# Patient Record
Sex: Male | Born: 1980 | Race: White | Hispanic: No | Smoking: Former smoker
Health system: Southern US, Community
[De-identification: ages and names within clinical notes are randomized; demographics above are authoritative.]

## PROBLEM LIST (undated history)

## (undated) DIAGNOSIS — K760 Fatty (change of) liver, not elsewhere classified: Secondary | ICD-10-CM

## (undated) DIAGNOSIS — K429 Umbilical hernia without obstruction or gangrene: Secondary | ICD-10-CM

## (undated) DIAGNOSIS — I10 Essential (primary) hypertension: Secondary | ICD-10-CM

## (undated) DIAGNOSIS — Z9989 Dependence on other enabling machines and devices: Secondary | ICD-10-CM

## (undated) DIAGNOSIS — G4733 Obstructive sleep apnea (adult) (pediatric): Secondary | ICD-10-CM

## (undated) DIAGNOSIS — J45909 Unspecified asthma, uncomplicated: Secondary | ICD-10-CM

## (undated) HISTORY — DX: Obstructive sleep apnea (adult) (pediatric): Z99.89

## (undated) HISTORY — PX: EXTERNAL EAR SURGERY: SHX627

## (undated) HISTORY — DX: Essential (primary) hypertension: I10

## (undated) HISTORY — DX: Fatty (change of) liver, not elsewhere classified: K76.0

## (undated) HISTORY — DX: Morbid (severe) obesity due to excess calories: E66.01

## (undated) HISTORY — DX: Umbilical hernia without obstruction or gangrene: K42.9

## (undated) HISTORY — DX: Obstructive sleep apnea (adult) (pediatric): G47.33

## (undated) HISTORY — PX: MOLE REMOVAL: SHX2046

---

## 2003-01-09 HISTORY — PX: TONSILLECTOMY: SHX5217

## 2007-06-06 ENCOUNTER — Ambulatory Visit: Payer: Self-pay | Admitting: Internal Medicine

## 2007-06-06 LAB — CONVERTED CEMR LAB
ALT: 31 units/L (ref 0–53)
BUN: 14 mg/dL (ref 6–23)
Bilirubin Urine: NEGATIVE
Bilirubin, Direct: 0.1 mg/dL (ref 0.0–0.3)
CO2: 30 meq/L (ref 19–32)
Calcium: 9 mg/dL (ref 8.4–10.5)
Eosinophils Relative: 2.3 % (ref 0.0–5.0)
Glucose, Bld: 102 mg/dL — ABNORMAL HIGH (ref 70–99)
Hemoglobin: 16.5 g/dL (ref 13.0–17.0)
Leukocytes, UA: NEGATIVE
Lymphocytes Relative: 26.2 % (ref 12.0–46.0)
Monocytes Relative: 6.4 % (ref 3.0–12.0)
Neutro Abs: 5.4 10*3/uL (ref 1.4–7.7)
Nitrite: NEGATIVE
RBC: 5.89 M/uL — ABNORMAL HIGH (ref 4.22–5.81)
RDW: 12.7 % (ref 11.5–14.6)
Sodium: 141 meq/L (ref 135–145)
Specific Gravity, Urine: 1.01 (ref 1.000–1.03)
Total CHOL/HDL Ratio: 5.8
Total Protein, Urine: NEGATIVE mg/dL
Total Protein: 7.4 g/dL (ref 6.0–8.3)
WBC: 8.4 10*3/uL (ref 4.5–10.5)
pH: 7 (ref 5.0–8.0)

## 2007-06-10 ENCOUNTER — Ambulatory Visit: Payer: Self-pay | Admitting: Internal Medicine

## 2007-06-10 DIAGNOSIS — J309 Allergic rhinitis, unspecified: Secondary | ICD-10-CM | POA: Insufficient documentation

## 2007-06-10 DIAGNOSIS — E669 Obesity, unspecified: Secondary | ICD-10-CM | POA: Insufficient documentation

## 2007-06-10 DIAGNOSIS — F329 Major depressive disorder, single episode, unspecified: Secondary | ICD-10-CM | POA: Insufficient documentation

## 2009-01-25 ENCOUNTER — Ambulatory Visit (HOSPITAL_COMMUNITY): Admission: RE | Admit: 2009-01-25 | Discharge: 2009-01-25 | Payer: Self-pay | Admitting: Internal Medicine

## 2009-01-25 ENCOUNTER — Ambulatory Visit: Payer: Self-pay | Admitting: Internal Medicine

## 2009-01-31 ENCOUNTER — Encounter (INDEPENDENT_AMBULATORY_CARE_PROVIDER_SITE_OTHER): Payer: Self-pay | Admitting: *Deleted

## 2009-02-22 ENCOUNTER — Ambulatory Visit: Payer: Self-pay | Admitting: Sports Medicine

## 2009-03-08 ENCOUNTER — Ambulatory Visit: Payer: Self-pay | Admitting: Sports Medicine

## 2009-04-11 ENCOUNTER — Ambulatory Visit: Payer: Self-pay | Admitting: Sports Medicine

## 2009-04-19 ENCOUNTER — Ambulatory Visit (HOSPITAL_COMMUNITY): Admission: RE | Admit: 2009-04-19 | Discharge: 2009-04-19 | Payer: Self-pay | Admitting: Sports Medicine

## 2010-02-07 NOTE — Assessment & Plan Note (Signed)
Summary: F/U FOOT ,MC   Vital Signs:  Patient profile:   30 year old male BP sitting:   145 / 90  Vitals Entered By: Lillia Pauls CMA (April 11, 2009 10:21 AM)  Primary Provider:  Norins   History of Present Illness: Pt here today to f/u left metatarsal fx, which he states is feel about 50% better now.  He now can walk "normally" on it now he sts. has worn cam walker for 4 wks on last visit some early callus over 5th MT and tender in that area of prox shaft  job is more sitting  Allergies: No Known Drug Allergies  Physical Exam  General:  obese  NAD Msk:  no swelling over left 5th MT now non tender to palpation of percussion thickened area feels like callus at prox shaft  MSK [place] there is clearly thickened bone cortex no neovessels or inc flow now callus is now "hard" remodeling seems to be taking place  images saved   Impression & Recommendations:  Problem # 1:  CLOSED FRACTURE OF METATARSAL BONE (ICD-825.25)  Orders: Radiology other (Radiology Other) [place] LIMITED ([id number])  looks to be helaing well by [place]  will ck plain film to confirm deg of callus  stay in cam walker and reck 2 wks  Problem # 2:  FOOT PAIN, LEFT (ICD-729.5) much less  still stay in cam walker but bring shoes on next visit as we may try to wean  Complete Medication List: 1)  Zyrtec Allergy 10 Mg Tabs (Cetirizine hcl) .... Take 1 tablet by mouth once a day as needed 2)  Aspirin Adult Low Strength 81 Mg Tbec (Aspirin) .... Take 1 tablet by mouth once a day as needed for pain

## 2010-02-07 NOTE — Letter (Signed)
Summary: *Consult Note  [facility]  [address]   [place], [place] [address]   Phone: [phone]  Fax: [phone]    Re:    Aaron Frost DOB:    10-Dec-1980 Dr. Doroteo [facility] 02/22/2009  Fax: [phone]  Dear [name]:    Thank you for requesting that we see the above patient for consultation.  A copy of the detailed office note will be sent under separate cover, for your review.  Evaluation today is consistent with: cuboid subluxation.  This is a bit of an unusual ligamentous injury to the lateral foot but certainly would be consistent with his normal Xrays and with what we found today on exam.   Our recommendation is for: Laterally cushioned insoles as well as a restrictive arch strap to try to allow the ligaments to heal.  I would like to recheck this to monitor progress in 4 weeks.  If not resolved at that point would look into other diagnostic options.   New Orders include:  1)  Consultation Level II [[address]242]   After today's visit, the patients current medications include: 1)  ZYRTEC ALLERGY 10 MG  TABS (CETIRIZINE HCL) Take 1 tablet by mouth once a day as needed 2)  ASPIRIN ADULT LOW STRENGTH 81 MG  TBEC (ASPIRIN) Take 1 tablet by mouth once a day as needed for pain   Thank you for this consultation.  If you have any further questions regarding the care of this patient, please do not hesitate to contact me @ [phone].  Thank you for this opportunity to look after your patient.   Sincerely,  Vincent Gros MD

## 2010-02-07 NOTE — Assessment & Plan Note (Signed)
Summary: LEFT FOOT GROWTH,PAIN, OFFERED SDA   Vital Signs:  Patient profile:   30 year old male Height:      73.5 inches Weight:      383 pounds BMI:     50.03 O2 Sat:      97 % on Room air Temp:     98.1 degrees F oral Pulse rate:   80 / minute BP sitting:   122 / 90  (left arm) Cuff size:   large  Vitals Entered By: Bill Salinas CMA (January 25, 2009 2:48 PM)  O2 Flow:  Room air CC: pt presents today with knot on his left foot that he states has been there x 2 week/ ab   Primary Care Provider:  Norins  CC:  pt presents today with knot on his left foot that he states has been there x 2 week/ ab.  History of Present Illness: Patient last seen June '09. Primary problem is obesity. His goal was to move towards 220 from 335. He has gone up to 385 instead. He does report that he is participating in a "Weight Watchers" program. We did discuss that this is the major health risk factor for him.  He presents today with c/o a painful nodule at the lateral aspect of the left foot. This is worse with weight bearing. He has discomfort with most shoes and this definitly is limiting his activities.  He does not recall any injury or precipitating event.   Current Medications (verified): 1)  Zyrtec Allergy 10 Mg  Tabs (Cetirizine Hcl) .... Take 1 Tablet By Mouth Once A Day As Needed 2)  Aspirin Adult Low Strength 81 Mg  Tbec (Aspirin) .... Take 1 Tablet By Mouth Once A Day As Needed For Pain  Allergies (verified): No Known Drug Allergies  Past History:  Past Medical History: Last updated: 06/10/2007 Chicken Pox,  fully immunized Depression-no professional diagnosis or treatment Allergic rhinitis  Past Surgical History: Last updated: 06/10/2007 Tonsillectomy '05 Right ear reconstruction  Family History: Last updated: 06/10/2007 father-'51: CAD/MI, Lipids mother - '57: overweight Neg- prostate cancer Maternal kinship with CAD, DM PGF - colon cancer  Social History: Last  updated: 06/10/2007 [employer] - Major in anthropology Engaged with wedding 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  The patient denies anorexia, fever, vision loss, hoarseness, chest pain, dyspnea on exertion, abdominal pain, incontinence, muscle weakness, depression, abnormal bleeding, and enlarged lymph nodes.    Physical Exam  General:  Obese white male in no distress Head:  Normocephalic and atraumatic without obvious abnormalities. No apparent alopecia or balding. Lungs:  normal respiratory effort and normal breath sounds.   Heart:  normal rate and regular rhythm.   Msk:  left foot with tenderness over the 5th metatarsal at mid-foot. No palpable mass or cyst. There is no deformity of the toes. No skin chagnes.  Neurologic:  alert & oriented X3.   Skin:  turgor normal, color normal, and no edema.   Cervical Nodes:  no anterior cervical adenopathy and no posterior cervical adenopathy.   Psych:  Oriented X3, memory intact for recent and remote, good eye contact, and not anxious appearing.     Impression & Recommendations:  Problem # 1:  FOOT PAIN, LEFT (ICD-729.5)  Orders: T-Foot Left  Min 3 Views [phone]) Sports Medicine (Sports Med)  addendum- Clinical Data: History given of pain on the lateral side of the left foot for 1 week.  History of soft tissue swelling.  No known injury.   LEFT FOOT - COMPLETE 3+ VIEW   Comparison: None   Findings: Posterior and plantar calcaneal spurring is present. Alignment is normal.  Joint spaces are preserved.  No fracture or dislocation is evident.  No soft tissue lesions are seen.   IMPRESSION: No fracture is evident.  There is calcaneal spurring.   Read By:  Crawford Givens,  M.D.  Plan - will refer  to Dr. Darrick Penna for consult re: foot pain.  Problem # 2:  OBESITY, UNSPECIFIED (ICD-278.00) Discussed the need for weight loss and the health implications.  Plan - he will continue with weight watchers.   Problem # 3:  Preventive Health Care (ICD-V70.0) Reveiwed note and labs from June '09. All labs including lipid panel were normal.  He is advised that he should have repeat labs in 2012 or 2013.  Complete Medication List: 1)  Zyrtec Allergy 10 Mg Tabs (Cetirizine hcl) .... Take 1 tablet by mouth once a day as needed 2)  Aspirin Adult Low Strength 81 Mg Tbec (Aspirin) .... Take 1 tablet by mouth once a day as needed for pain

## 2010-02-07 NOTE — Assessment & Plan Note (Signed)
Summary: SWOLLEN FOOT,MC   Vital Signs:  Patient profile:   30 year old male BP sitting:   133 / 86  Vitals Entered By: Lillia Pauls CMA (March 08, 2009 2:46 PM)  Primary Care Provider:  Norins  CC:  f/u left foot pain.  History of Present Illness: F/U left foot pain, was here 2 weeks ago with subluxation and ligamentous strain at the cuboid and base of 4th and 5th met.  Was given comforthotics with lateral post and transverse arch band.  Says it felt okay for a week or so, but last sunday he did some walking, he was on his feet for about 3 hours.  Since then foot has been much more swollen and painful.  He has a burning sensation on the lateral portion of the foot from midfoot down.  Hasn't been able to use the arch band because of the swelling and pain.    Problems Prior to Update: 1)  Closed Fracture of Metatarsal Bone  (ICD-825.25) 2)  Foot Pain, Left  (ICD-729.5) 3)  Obesity, Unspecified  (ICD-278.00) 4)  Allergic Rhinitis  (ICD-477.9) 5)  Depression  (ICD-311)  Medications Prior to Update: 1)  Zyrtec Allergy 10 Mg  Tabs (Cetirizine Hcl) .... Take 1 Tablet By Mouth Once A Day As Needed 2)  Aspirin Adult Low Strength 81 Mg  Tbec (Aspirin) .... Take 1 Tablet By Mouth Once A Day As Needed For Pain  Allergies: No Known Drug Allergies  Review of Systems MS:  Complains of joint pain and joint swelling. Neuro:  Complains of tingling; denies numbness and weakness.  Physical Exam  General:  overweight, NAD Msk:  left foot has visible swelling compared to right especially in the forefoot TTP along the base of the 5th met and cuboid both plantar side and dorsal side and lateral some breakdown of lateral 2 rays, some internal rotation of 4th and 5th toes   Ultrasound demonstrates a fracture of the shaft of 5th metatarsal likely oblique type orientation with some callus formation apparent.  Significant increased blood flow and edema.  Seen in both transverse and cross sectional  views.  Saved for documentation. Pulses:  normal distal pulses Neurologic:  sensation intact Psych:  Cognition and judgment appear intact. Alert and cooperative with normal attention span and concentration.    Impression & Recommendations:  Problem # 1:  CLOSED FRACTURE OF METATARSAL BONE (ICD-825.25) Assessment New  5th met fracture seen on ultrasound imaging in office will place in Cam walker and f/u in 2 weeks with repeat imaging, likely need 6-8 weeks in cam walker  Orders: [place] LIMITED ([id number])  Complete Medication List: 1)  Zyrtec Allergy 10 Mg Tabs (Cetirizine hcl) .... Take 1 tablet by mouth once a day as needed 2)  Aspirin Adult Low Strength 81 Mg Tbec (Aspirin) .... Take 1 tablet by mouth once a day as needed for pain  Patient Instructions: 1)  Will need to order CamWalker, return to office Friday for pick up 2)  Will wrap with ace wrap today and some cast padding  Appended Document: SWOLLEN FOOT,MC

## 2010-02-07 NOTE — Letter (Signed)
Summary: [facility] Consult Scheduled Letter  [facility]  [address] [place], [place] [address]   Phone: [phone]  Fax: [phone]      01/31/2009 MRN: [id number]  Bing Bassette [address] [place], [place]  [address]    Dear Mr. Hitz,      We have scheduled an appointment for you.  At the recommendation of Dr.Norins, we have scheduled you a consult with Dr Darrick Penna on 02/14/09 at 9:15am.  Their phone number is [phone].  If this appointment day and time is not convenient for you, please feel free to call the office of the doctor you are being referred to at the number listed above and reschedule the appointment.    [facility] [address] [place], [place] [address] *Located beside the [facility]*    Thank you,  Patient Care Coordinator [facility]

## 2010-02-07 NOTE — Assessment & Plan Note (Signed)
Summary: L FOOT PAIN,MC   Vital Signs:  Patient profile:   30 year old male BP sitting:   134 / 88  Vitals Entered By: Lillia Pauls CMA (February 22, 2009 10:08 AM)  Primary Provider:  Norins  CC:  left foot pain.  History of Present Illness: Pt c/o left lateral foot pain for the last 6 weeks. He doesn't remember a specific injury, says he just got out of bed one day and the left side of his left foot had a sharp burning pain which occurs with each step.  Has been constant since then. Pain is located over the area of the 5th met and the cuboid. It's also affecting his bowling since the left foot is his plant foot.  Recently saw his internist, Dr Debby Bud,  who orderd xrays which were negative.  Pt has been using ibuprofen for pain.    Dr Debby Bud referred him to [place] for our impression.  Problems Prior to Update: 1)  Foot Pain, Left  (ICD-729.5) 2)  Obesity, Unspecified  (ICD-278.00) 3)  Allergic Rhinitis  (ICD-477.9) 4)  Depression  (ICD-311)  Allergies: No Known Drug Allergies  Social History: Reviewed history from 06/10/2007 and no changes required. 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 Neuro:  Denies numbness, tingling, and weakness.  Physical Exam  General:  obese male in NAD Msk:  feet bilat reveal some breakdown of lat 2 rays with slight IR of 4tha nd 5th toes some loss of long arch as well left foot shows mild swelling over base of 5th MT  on prone lying position a whip test reveals hypermobility of left lat foot this also reproduces some of his pain  note on direct palpation there is minimal pain  manipulation of cuboid also reroduces some pain as does resistance of eversion at insertion of per brevis tendon  Gait is supinated bilat   Impression & Recommendations:  Problem # 1:  FOOT PAIN, LEFT (ICD-729.5) This is likely to be a cuboid subluxation  and ligamentous strain at base of 5th, 4th MT and cuboid mid arch strap for at least 6 weeks cushioned insoles with lateral posting bilat icing at end of day  reck 1 month  note he is more comfortable walking once he is placed in these  Problem # 2:  OBESITY, UNSPECIFIED (ICD-278.00) while his weight undoubtedly contributes this may have flared 2/2 his bowling shoes have little support forward foot takes rotatinal stress  Complete Medication List: 1)  Zyrtec Allergy 10 Mg Tabs (Cetirizine hcl) .... Take 1 tablet by mouth once a day as needed 2)  Aspirin Adult Low Strength 81 Mg Tbec (Aspirin) .... Take 1 tablet by mouth once a day as needed for pain

## 2010-06-29 ENCOUNTER — Encounter: Payer: Self-pay | Admitting: Internal Medicine

## 2010-07-06 ENCOUNTER — Encounter: Payer: Self-pay | Admitting: Internal Medicine

## 2010-07-14 ENCOUNTER — Ambulatory Visit (INDEPENDENT_AMBULATORY_CARE_PROVIDER_SITE_OTHER): Payer: Managed Care, Other (non HMO) | Admitting: Internal Medicine

## 2010-07-14 DIAGNOSIS — E669 Obesity, unspecified: Secondary | ICD-10-CM

## 2010-07-14 DIAGNOSIS — I10 Essential (primary) hypertension: Secondary | ICD-10-CM

## 2010-07-14 MED ORDER — HYDROCHLOROTHIAZIDE 25 MG PO TABS: 25.0000 mg | ORAL_TABLET | Freq: Every day | ORAL | Status: DC

## 2010-07-14 NOTE — Progress Notes (Signed)
  Subjective:    Patient ID: Aaron Frost, male    DOB: 26-May-1980, 30 y.o.   MRN: [id number]  HPI Aaron Frost presents c/o cold feet, sensation of fluid retention and swelling in his feet. He also generally feels weak and low on energy. He had no focal complaints otherwise.     Past Medical History:    Reviewed history and no changes required:       Chicken Pox,  fully immunized       Depression-no professional diagnosis or treatment       Allergic rhinitis  Past Surgical History:    Reviewed history and no changes required:       Tonsillectomy '05       Right ear reconstruction   Family History:    father-'51: CAD/MI, Lipids    mother - '57: overweight    Neg- prostate cancer    Maternal kinship with CAD, DM    PGF - colon cancer  Social History:    [employer] - Major in anthropology    Engaged with wedding Sp '10    work: Clinical biochemist          Review of Systems Review of Systems  Constitutional:  Negative for fever, chills, activity change and unexpected weight change.  HEENT:  Negative for hearing loss, ear pain, congestion, neck stiffness and postnasal drip. Negative for sore throat or swallowing problems. Negative for dental complaints.   Eyes: Negative for vision loss or change in visual acuity.  Respiratory: Negative for chest tightness and wheezing.   Cardiovascular: Negative for chest pain and palpitation. No decreased exercise tolerance Gastrointestinal: No change in bowel habit. No bloating or gas. No reflux or indigestion Genitourinary: Negative for urgency, frequency, flank pain and difficulty urinating.  Musculoskeletal: Negative for myalgias, back pain, arthralgias and gait problem.  Neurological: Negative for dizziness, tremors, weakness and headaches.  Hematological: Negative for adenopathy.  Psychiatric/Behavioral: Negative for behavioral problems and dysphoric mood.       Objective:   Physical Exam Vitals noted - massively  overweight Gen'l - obese whte male in no acute distress HEENT C&S clear Pulmonary - normal respirations w/o increased WOB Cor - 2+ radial, DP and PT pulses; normal capillary refill at the toes; not cool to touch at the feet       Assessment & Plan:  1. Cold  Feet - normal circulation. This may be a neuropathy leading to the sensation of coldness. There is no pitting edema but he is at risk for peripehral venous insufficiency.  Plan - no cardiovascular work-up at this time.

## 2010-07-16 NOTE — Assessment & Plan Note (Signed)
BMI is 47.4 based on an estimated height of 74 inches. Discussed obesity with the patient and his wife. This is a MAJOR health risk! Today he is hypertensive and this is directly related to his weight. He states that he has tried nutri-systems and found it intolerable. He has tried weight watchers but did not like this program.   Plan - he is strongly encouraged to consider bariatric surgery - lap band procedure. He has a preformed bias in that his grandmother had gastric by-pass and had a lot of problems afterward. He is advised that a lot of progress has been made and that there are fewer complications. Furthermore, I shared with him that there is incontrovertible evidence that bariatric surgery and reduce all the metabolic diseases associated with obesity: hypertension, diabetes, heart disease, joint disease. He is instructed to contact [facility] to sign up for the next bariatric surgery education program.

## 2010-07-16 NOTE — Assessment & Plan Note (Signed)
Elevated Blood pressure but asymptomatic.  Plan - start HCTZ 25 mg once a day           Return for BP follow-up and lab work in 3 weeks.

## 2010-07-17 ENCOUNTER — Telehealth: Payer: Self-pay | Admitting: *Deleted

## 2010-07-17 NOTE — Telephone Encounter (Signed)
Message copied by [facility], Ty Buntrock B on Mon Jul 17, 2010  3:37 PM ------      Message from: Jacques Navy      Created: Sun Jul 16, 2010  4:16 PM       1. Copy note to patient      2. Call to let him know labs are ordered for July 30th - ok for fluids and very light bkfast that morning.       3. He should have BP check July 30th            Thank you

## 2010-07-18 NOTE — Telephone Encounter (Signed)
Patient informed, Nurse visit scheduled.

## 2010-08-04 DIAGNOSIS — Z0289 Encounter for other administrative examinations: Secondary | ICD-10-CM

## 2011-03-16 ENCOUNTER — Encounter: Payer: Self-pay | Admitting: Internal Medicine

## 2011-03-16 ENCOUNTER — Ambulatory Visit (INDEPENDENT_AMBULATORY_CARE_PROVIDER_SITE_OTHER): Payer: Managed Care, Other (non HMO) | Admitting: Internal Medicine

## 2011-03-16 DIAGNOSIS — Z0000 Encounter for general adult medical examination without abnormal findings: Secondary | ICD-10-CM

## 2011-03-16 DIAGNOSIS — R0989 Other specified symptoms and signs involving the circulatory and respiratory systems: Secondary | ICD-10-CM

## 2011-03-16 DIAGNOSIS — J392 Other diseases of pharynx: Secondary | ICD-10-CM

## 2011-03-16 DIAGNOSIS — R0609 Other forms of dyspnea: Secondary | ICD-10-CM

## 2011-03-16 DIAGNOSIS — Z23 Encounter for immunization: Secondary | ICD-10-CM

## 2011-03-16 DIAGNOSIS — I10 Essential (primary) hypertension: Secondary | ICD-10-CM

## 2011-03-16 DIAGNOSIS — R0683 Snoring: Secondary | ICD-10-CM

## 2011-03-16 DIAGNOSIS — F329 Major depressive disorder, single episode, unspecified: Secondary | ICD-10-CM

## 2011-03-16 LAB — BASIC METABOLIC PANEL
CO2: 25 mEq/L (ref 19–32)
Calcium: 9.1 mg/dL (ref 8.4–10.5)
Creat: 0.85 mg/dL (ref 0.50–1.35)
Glucose, Bld: 140 mg/dL — ABNORMAL HIGH (ref 70–99)

## 2011-03-16 LAB — HEPATIC FUNCTION PANEL
ALT: 50 U/L (ref 0–53)
Indirect Bilirubin: 0.3 mg/dL (ref 0.0–0.9)
Total Protein: 7.1 g/dL (ref 6.0–8.3)

## 2011-03-16 LAB — LIPID PANEL
Cholesterol: 199 mg/dL (ref 0–200)
Triglycerides: 197 mg/dL — ABNORMAL HIGH (ref <150)
VLDL: 39 mg/dL (ref 0–40)

## 2011-03-16 MED ORDER — OMEPRAZOLE 40 MG PO CPDR: 40.0000 mg | DELAYED_RELEASE_CAPSULE | Freq: Every day | ORAL | Status: DC

## 2011-03-16 NOTE — Progress Notes (Signed)
  Subjective:    Patient ID: Aaron Frost, male    DOB: 09/12/1980, 31 y.o.   MRN: [id number]  HPI Pt presents to clinic for general physical. bp mildly elevated without medication. Previously took hctz and stopped in the fall. Has known obesity and states has in the past lost over 100lbs with diet/exercise. Feels depressed mood due to his weight. Has GERD sx's 4-5x/week without dysphagia. Has h/o snoring and witnessed apnea. Notes several day h/o ST without f/c or cough.  Past Medical History  Diagnosis Date  . GERD (gastroesophageal reflux disease)   . Hypertension    Past Surgical History  Procedure Date  . Tonsillectomy 2005  . External ear surgery     right ear reconstruction    reports that he has quit smoking. He has never used smokeless tobacco. He reports that he drinks alcohol. He reports that he does not use illicit drugs. family history includes Colon cancer in his paternal grandfather; Diabetes in his maternal grandfather; Heart disease in his father; and Hypertension in his mother.  There is no history of Breast cancer and Prostate cancer. No Known Allergies   Review of Systems see hpi     Objective:   Physical Exam  Nursing note and vitals reviewed. Constitutional: He appears well-developed and well-nourished. No distress.  HENT:  Head: Normocephalic and atraumatic.  Right Ear: Tympanic membrane, external ear and ear canal normal.  Left Ear: Tympanic membrane, external ear and ear canal normal.  Nose: Nose normal.  Mouth/Throat: Oropharynx is clear and moist. No oropharyngeal exudate.  Eyes: Conjunctivae and EOM are normal. Pupils are equal, round, and reactive to light. No scleral icterus.  Neck: Neck supple. No thyromegaly present.  Cardiovascular: Normal rate, regular rhythm and normal heart sounds.  Exam reveals no gallop and no friction rub.   No murmur heard. Pulmonary/Chest: Effort normal and breath sounds normal. No respiratory distress. He has no  wheezes. He has no rales.  Abdominal: Soft. Bowel sounds are normal. He exhibits no distension and no mass. There is no tenderness. There is no rebound and no guarding.  Lymphadenopathy:    He has no cervical adenopathy.  Neurological: He is alert.  Skin: Skin is warm and dry. He is not diaphoretic.  Psychiatric: He has a normal mood and affect.          Assessment & Plan:

## 2011-03-17 LAB — CBC WITH DIFFERENTIAL/PLATELET
Basophils Relative: 1 % (ref 0–1)
Eosinophils Absolute: 0.3 10*3/uL (ref 0.0–0.7)
Eosinophils Relative: 5 % (ref 0–5)
HCT: 48.3 % (ref 39.0–52.0)
Hemoglobin: 16 g/dL (ref 13.0–17.0)
Lymphs Abs: 2.1 10*3/uL (ref 0.7–4.0)
MCHC: 33.1 g/dL (ref 30.0–36.0)
MCV: 85.6 fL (ref 78.0–100.0)
Monocytes Relative: 8 % (ref 3–12)
Platelets: 261 10*3/uL (ref 150–400)
RBC: 5.64 MIL/uL (ref 4.22–5.81)
WBC: 6.8 10*3/uL (ref 4.0–10.5)

## 2011-03-17 LAB — URINALYSIS, MICROSCOPIC ONLY: Squamous Epithelial / LPF: NONE SEEN

## 2011-03-17 LAB — URINALYSIS, ROUTINE W REFLEX MICROSCOPIC
Bilirubin Urine: NEGATIVE
Leukocytes, UA: NEGATIVE
Protein, ur: NEGATIVE mg/dL
Specific Gravity, Urine: 1.026 (ref 1.005–1.030)
Urobilinogen, UA: 0.2 mg/dL (ref 0.0–1.0)

## 2011-03-17 LAB — TSH: TSH: 2.738 u[IU]/mL (ref 0.350–4.500)

## 2011-03-17 LAB — HEMOGLOBIN A1C
Hgb A1c MFr Bld: 5.5 % (ref <5.7)
Mean Plasma Glucose: 111 mg/dL (ref <117)

## 2011-03-17 NOTE — Assessment & Plan Note (Signed)
Low sodium diet, exercise and wt loss. Maintain bp log and schedule close f/u.

## 2011-03-17 NOTE — Assessment & Plan Note (Signed)
Nl exam. Obtain cpe labs. 

## 2011-03-17 NOTE — Assessment & Plan Note (Signed)
Pulmonary consult. Wt loss encouraged.

## 2011-03-17 NOTE — Assessment & Plan Note (Signed)
Rapid strep neg

## 2011-03-17 NOTE — Assessment & Plan Note (Signed)
No si. Currently defers therapist or medication

## 2011-04-02 ENCOUNTER — Encounter: Payer: Self-pay | Admitting: Internal Medicine

## 2011-04-02 ENCOUNTER — Ambulatory Visit (INDEPENDENT_AMBULATORY_CARE_PROVIDER_SITE_OTHER): Payer: Managed Care, Other (non HMO) | Admitting: Internal Medicine

## 2011-04-02 VITALS — BP 142/100 | HR 99 | Temp 99.8°F | Resp 20

## 2011-04-02 DIAGNOSIS — J069 Acute upper respiratory infection, unspecified: Secondary | ICD-10-CM

## 2011-04-02 DIAGNOSIS — J029 Acute pharyngitis, unspecified: Secondary | ICD-10-CM

## 2011-04-02 DIAGNOSIS — R059 Cough, unspecified: Secondary | ICD-10-CM

## 2011-04-02 DIAGNOSIS — R05 Cough: Secondary | ICD-10-CM

## 2011-04-02 MED ORDER — ALBUTEROL SULFATE HFA 108 (90 BASE) MCG/ACT IN AERS: 2.0000 | INHALATION_SPRAY | Freq: Four times a day (QID) | RESPIRATORY_TRACT | Status: DC | PRN

## 2011-04-02 MED ORDER — LEVOFLOXACIN 500 MG PO TABS: 500.0000 mg | ORAL_TABLET | Freq: Every day | ORAL | Status: AC

## 2011-04-03 ENCOUNTER — Ambulatory Visit (HOSPITAL_BASED_OUTPATIENT_CLINIC_OR_DEPARTMENT_OTHER)
Admission: RE | Admit: 2011-04-03 | Discharge: 2011-04-03 | Disposition: A | Discharge status: Home / Self Care | Payer: Managed Care, Other (non HMO) | Source: Ambulatory Visit | Attending: Internal Medicine | Admitting: Internal Medicine

## 2011-04-03 DIAGNOSIS — R059 Cough, unspecified: Secondary | ICD-10-CM

## 2011-04-03 DIAGNOSIS — R05 Cough: Secondary | ICD-10-CM

## 2011-04-03 DIAGNOSIS — R062 Wheezing: Secondary | ICD-10-CM | POA: Insufficient documentation

## 2011-04-05 ENCOUNTER — Ambulatory Visit: Payer: Managed Care, Other (non HMO) | Admitting: Internal Medicine

## 2011-04-07 NOTE — Assessment & Plan Note (Addendum)
Attempt abx course. Albuterol mdi prn. Obtain cxr. Rapid strep neg Followup if no improvement or worsening.

## 2011-04-07 NOTE — Progress Notes (Signed)
  Subjective:    Patient ID: Aaron Frost, male    DOB: 08-12-1980, 31 y.o.   MRN: [id number]  HPI Pt presents to clinic for evaluation of cough. Notes 6d h/o throat irritation and cough intermittently productive for green/brown sputum. Has noted intermittent minimal hemoptysis without gross active bleeding. +intermittent subjective wheezing. Taking otc medication without improvement. No other alleviating or exacerbating factors.   Past Medical History  Diagnosis Date  . GERD (gastroesophageal reflux disease)   . Hypertension    Past Surgical History  Procedure Date  . Tonsillectomy 2005  . External ear surgery     right ear reconstruction    reports that he has quit smoking. He has never used smokeless tobacco. He reports that he drinks alcohol. He reports that he does not use illicit drugs. family history includes Colon cancer in his paternal grandfather; Diabetes in his maternal grandfather; Heart disease in his father; and Hypertension in his mother.  There is no history of Breast cancer and Prostate cancer. No Known Allergies   Review of Systems see hpi     Objective:   Physical Exam  Nursing note and vitals reviewed. Constitutional: He appears well-developed and well-nourished. No distress.  HENT:  Head: Normocephalic and atraumatic.  Mouth/Throat: Uvula is midline. Posterior oropharyngeal erythema present. No posterior oropharyngeal edema.  Eyes: Conjunctivae are normal. No scleral icterus.  Neck: Neck supple.  Cardiovascular: Normal rate, regular rhythm and normal heart sounds.   Pulmonary/Chest: Effort normal. No respiratory distress. He has wheezes. He has no rales.  Lymphadenopathy:    He has no cervical adenopathy.  Neurological: He is alert.  Skin: Skin is warm and dry. He is not diaphoretic.  Psychiatric: He has a normal mood and affect.          Assessment & Plan:

## 2011-04-23 ENCOUNTER — Institutional Professional Consult (permissible substitution): Payer: Managed Care, Other (non HMO) | Admitting: Pulmonary Disease

## 2011-04-25 ENCOUNTER — Ambulatory Visit: Payer: Managed Care, Other (non HMO) | Admitting: Internal Medicine

## 2011-05-18 ENCOUNTER — Ambulatory Visit: Payer: Managed Care, Other (non HMO) | Admitting: Internal Medicine

## 2011-05-29 ENCOUNTER — Ambulatory Visit (INDEPENDENT_AMBULATORY_CARE_PROVIDER_SITE_OTHER): Payer: Managed Care, Other (non HMO) | Admitting: Internal Medicine

## 2011-05-29 ENCOUNTER — Encounter: Payer: Self-pay | Admitting: Internal Medicine

## 2011-05-29 VITALS — BP 126/96 | HR 68 | Temp 98.2°F | Resp 18 | Wt >= 6400 oz

## 2011-05-29 DIAGNOSIS — R0683 Snoring: Secondary | ICD-10-CM

## 2011-05-29 DIAGNOSIS — I10 Essential (primary) hypertension: Secondary | ICD-10-CM

## 2011-05-29 DIAGNOSIS — E3452 Partial androgen insensitivity syndrome: Secondary | ICD-10-CM | POA: Insufficient documentation

## 2011-05-29 DIAGNOSIS — E669 Obesity, unspecified: Secondary | ICD-10-CM

## 2011-05-29 DIAGNOSIS — R0989 Other specified symptoms and signs involving the circulatory and respiratory systems: Secondary | ICD-10-CM

## 2011-05-29 DIAGNOSIS — R0609 Other forms of dyspnea: Secondary | ICD-10-CM

## 2011-05-29 MED ORDER — OMEPRAZOLE 40 MG PO CPDR: 40.0000 mg | DELAYED_RELEASE_CAPSULE | Freq: Every day | ORAL | Status: DC

## 2011-05-29 MED ORDER — LOSARTAN POTASSIUM 50 MG PO TABS: 50.0000 mg | ORAL_TABLET | Freq: Every day | ORAL | Status: DC

## 2011-05-29 NOTE — Assessment & Plan Note (Signed)
reattempt pulmonary consult

## 2011-05-29 NOTE — Assessment & Plan Note (Signed)
Discussed dietary changes including portion/calorie reduction as well as sugar/carb restriction. Begin regular exercise program with walking at least 3-4/wk for 30+minutes.

## 2011-05-29 NOTE — Assessment & Plan Note (Signed)
Lab does not perform outpt sperm analysis. Recommend that his wife contact her gyn.

## 2011-05-29 NOTE — Progress Notes (Signed)
  Subjective:    Patient ID: Aaron Frost, male    DOB: [birth year], 31 y.o.   MRN: [id number]  HPI Pt presents to clinic for followup of multiple medical problems. BP remains elevated. Feels mood is improved despite extra stressors with moving. Has snoring and did not get pulmonary appt after referral. His wife is being evaluated for infertility and he asks about performing sperm analysis.  Past Medical History  Diagnosis Date  . GERD (gastroesophageal reflux disease)   . Hypertension    Past Surgical History  Procedure Date  . Tonsillectomy 2005  . External ear surgery     right ear reconstruction    reports that he has quit smoking. He has never used smokeless tobacco. He reports that he drinks alcohol. He reports that he does not use illicit drugs. family history includes Colon cancer in his paternal grandfather; Diabetes in his maternal grandfather; Heart disease in his father; and Hypertension in his mother.  There is no history of Breast cancer and Prostate cancer. No Known Allergies    Review of Systems see hpi     Objective:   Physical Exam  Physical Exam  Nursing note and vitals reviewed. Constitutional: Appears well-developed and well-nourished. No distress.  HENT:  Head: Normocephalic and atraumatic.  Right Ear: External ear normal.  Left Ear: External ear normal.  Eyes: Conjunctivae are normal. No scleral icterus.  Neck: Neck supple. Carotid bruit is not present.  Cardiovascular: Normal rate, regular rhythm and normal heart sounds.  Exam reveals no gallop and no friction rub.   No murmur heard. Pulmonary/Chest: Effort normal and breath sounds normal. No respiratory distress. He has no wheezes. no rales.  Lymphadenopathy:    He has no cervical adenopathy.  Neurological:Alert.  Skin: Skin is warm and dry. Not diaphoretic.  Psychiatric: Has a normal mood and affect.        Assessment & Plan:

## 2011-05-29 NOTE — Assessment & Plan Note (Signed)
Begin losartan 50mg  qd. Monitor bp as outpt and f/u in clinic as scheduled. Wt loss and exercise recommended.

## 2011-07-04 ENCOUNTER — Encounter: Payer: Self-pay | Admitting: Internal Medicine

## 2011-07-04 ENCOUNTER — Ambulatory Visit (INDEPENDENT_AMBULATORY_CARE_PROVIDER_SITE_OTHER): Payer: Managed Care, Other (non HMO) | Admitting: Internal Medicine

## 2011-07-04 ENCOUNTER — Telehealth: Payer: Self-pay | Admitting: Internal Medicine

## 2011-07-04 VITALS — BP 112/90 | HR 80 | Temp 98.4°F | Resp 20 | Ht 73.0 in | Wt >= 6400 oz

## 2011-07-04 DIAGNOSIS — E785 Hyperlipidemia, unspecified: Secondary | ICD-10-CM

## 2011-07-04 DIAGNOSIS — E669 Obesity, unspecified: Secondary | ICD-10-CM

## 2011-07-04 NOTE — Assessment & Plan Note (Signed)
Encourage in wt loss efforts. Discussed diet changes and regular exercise. Not interested in phentermine or bariatric surgery.

## 2011-07-04 NOTE — Telephone Encounter (Signed)
Lab order entered for August 2013.

## 2011-07-04 NOTE — Progress Notes (Signed)
  Subjective:    Patient ID: Aaron Frost, male    DOB: [birth year], 31 y.o.   MRN: [id number]  HPI Pt presents to clinic for followup of multiple medical problems. Has increased activity and wt has increased. Home bp's stated to be normal. Feels less anxious. Tolerating losartan without side effect. Total time of visit 24 minutes of which >50% of time spent in counseling.  Past Medical History  Diagnosis Date  . GERD (gastroesophageal reflux disease)   . Hypertension    Past Surgical History  Procedure Date  . Tonsillectomy 2005  . External ear surgery     right ear reconstruction    reports that he has quit smoking. He has never used smokeless tobacco. He reports that he drinks alcohol. He reports that he does not use illicit drugs. family history includes Colon cancer in his paternal grandfather; Diabetes in his maternal grandfather; Heart disease in his father; and Hypertension in his mother.  There is no history of Breast cancer and Prostate cancer. No Known Allergies    Review of Systems see hpi     Objective:   Physical Exam  Nursing note and vitals reviewed. Constitutional: He appears well-developed and well-nourished.          Assessment & Plan:

## 2011-07-04 NOTE — Patient Instructions (Signed)
Please schedule fasting labs prior to next visit Lipid-272.4 

## 2011-07-09 ENCOUNTER — Institutional Professional Consult (permissible substitution): Payer: Managed Care, Other (non HMO) | Admitting: Pulmonary Disease

## 2011-08-03 ENCOUNTER — Institutional Professional Consult (permissible substitution): Payer: Managed Care, Other (non HMO) | Admitting: Pulmonary Disease

## 2011-09-03 ENCOUNTER — Ambulatory Visit: Payer: Managed Care, Other (non HMO) | Admitting: Internal Medicine

## 2011-09-07 ENCOUNTER — Ambulatory Visit: Payer: Managed Care, Other (non HMO) | Admitting: Internal Medicine

## 2011-10-02 ENCOUNTER — Telehealth: Payer: Self-pay | Admitting: Internal Medicine

## 2011-10-02 NOTE — Telephone Encounter (Signed)
Sounds like he declined earlier appts

## 2011-10-02 NOTE — Telephone Encounter (Signed)
Patient called in stating that he has had severe swelling in his feet the past few days. I offered appointment for this afternoon, patient declined stating that he could not get off work. He said that he could not come in until Thursday morning. I scheduled appointment for 10/04/11 at 8:15. Is it okay for patient to wait or should he come in? Thanks.

## 2011-10-04 ENCOUNTER — Ambulatory Visit (INDEPENDENT_AMBULATORY_CARE_PROVIDER_SITE_OTHER): Payer: Managed Care, Other (non HMO) | Admitting: Internal Medicine

## 2011-10-04 ENCOUNTER — Encounter: Payer: Self-pay | Admitting: Internal Medicine

## 2011-10-04 VITALS — BP 130/92 | HR 76 | Temp 97.9°F | Resp 18 | Wt >= 6400 oz

## 2011-10-04 DIAGNOSIS — M7989 Other specified soft tissue disorders: Secondary | ICD-10-CM

## 2011-10-04 DIAGNOSIS — R0609 Other forms of dyspnea: Secondary | ICD-10-CM

## 2011-10-04 DIAGNOSIS — IMO0002 Reserved for concepts with insufficient information to code with codable children: Secondary | ICD-10-CM

## 2011-10-04 DIAGNOSIS — Z79899 Other long term (current) drug therapy: Secondary | ICD-10-CM

## 2011-10-04 DIAGNOSIS — R609 Edema, unspecified: Secondary | ICD-10-CM

## 2011-10-04 DIAGNOSIS — R0989 Other specified symptoms and signs involving the circulatory and respiratory systems: Secondary | ICD-10-CM

## 2011-10-04 DIAGNOSIS — M792 Neuralgia and neuritis, unspecified: Secondary | ICD-10-CM

## 2011-10-04 DIAGNOSIS — R0600 Dyspnea, unspecified: Secondary | ICD-10-CM

## 2011-10-04 LAB — HEPATIC FUNCTION PANEL
ALT: 47 U/L (ref 0–53)
Indirect Bilirubin: 0.4 mg/dL (ref 0.0–0.9)
Total Protein: 7 g/dL (ref 6.0–8.3)

## 2011-10-04 LAB — BASIC METABOLIC PANEL
CO2: 29 mEq/L (ref 19–32)
Calcium: 9.5 mg/dL (ref 8.4–10.5)
Chloride: 103 mEq/L (ref 96–112)
Sodium: 140 mEq/L (ref 135–145)

## 2011-10-04 MED ORDER — DICLOFENAC SODIUM 75 MG PO TBEC: DELAYED_RELEASE_TABLET | ORAL | Status: AC

## 2011-10-04 MED ORDER — FUROSEMIDE 20 MG PO TABS: ORAL_TABLET | ORAL | Status: DC

## 2011-10-07 DIAGNOSIS — M7989 Other specified soft tissue disorders: Secondary | ICD-10-CM | POA: Insufficient documentation

## 2011-10-07 DIAGNOSIS — M792 Neuralgia and neuritis, unspecified: Secondary | ICD-10-CM | POA: Insufficient documentation

## 2011-10-07 NOTE — Assessment & Plan Note (Signed)
Obtain Chem-7, liver function tests and BNP. Attempt Lasix when necessary.

## 2011-10-07 NOTE — Progress Notes (Signed)
  Subjective:    Patient ID: Aaron Frost, male    DOB: [birth year], 31 y.o.   MRN: [id number]  HPI patient presents to clinic for evaluation of bilateral leg swelling. Notes four-day history of bilateral lower chimney edema. No clear-cut associated shortness of breath though there is a question of a period has had no calf pain or risk factors for VGTE. With history of obesity he is scheduled to see pulmonary for possible OSA. Also notes pain of left shoulder radiating down left arm to hand. No associated weakness or paresthesias. No injury or trauma. No alleviating or exacerbating factors and is taking no medication for the problem. No other complaints.  Past Medical History  Diagnosis Date  . GERD (gastroesophageal reflux disease)   . Hypertension    Past Surgical History  Procedure Date  . Tonsillectomy 2005  . External ear surgery     right ear reconstruction    reports that he has quit smoking. He has never used smokeless tobacco. He reports that he drinks alcohol. He reports that he does not use illicit drugs. family history includes Colon cancer in his paternal grandfather; Diabetes in his maternal grandfather; Heart disease in his father; and Hypertension in his mother.  There is no history of Breast cancer and Prostate cancer. Allergies  Allergen Reactions  . Aleve (Naproxen)     Burning sensation and headaches     Review of Systems see history of present illness     Objective:   Physical Exam  Nursing note and vitals reviewed. Constitutional: He appears well-developed and well-nourished. No distress.  HENT:  Head: Normocephalic and atraumatic.  Right Ear: External ear normal.  Left Ear: External ear normal.  Eyes: Conjunctivae normal are normal. No scleral icterus.  Neck: Neck supple. No JVD present.  Cardiovascular: Normal rate, regular rhythm and normal heart sounds.   Musculoskeletal:       Full range of motion left arm and neck. Distal MCP and intertriginous  muscles strength 5/5.  Skin: Skin is warm. No rash noted. He is not diaphoretic. No erythema.       +1 bilateral lower extremity edema. No palpable cords calf swelling or calf tenderness.   Psychiatric: He has a normal mood and affect.          Assessment & Plan:

## 2011-10-07 NOTE — Assessment & Plan Note (Signed)
Attempt Voltaren with food and no other anti-inflammatories. Followup if no improvement or worsening. 

## 2011-10-08 ENCOUNTER — Ambulatory Visit (INDEPENDENT_AMBULATORY_CARE_PROVIDER_SITE_OTHER): Payer: Managed Care, Other (non HMO) | Admitting: Pulmonary Disease

## 2011-10-08 ENCOUNTER — Encounter: Payer: Self-pay | Admitting: Pulmonary Disease

## 2011-10-08 VITALS — BP 148/84 | HR 79 | Temp 98.1°F | Ht 74.0 in | Wt >= 6400 oz

## 2011-10-08 DIAGNOSIS — G4733 Obstructive sleep apnea (adult) (pediatric): Secondary | ICD-10-CM

## 2011-10-08 DIAGNOSIS — Z9989 Dependence on other enabling machines and devices: Secondary | ICD-10-CM | POA: Insufficient documentation

## 2011-10-08 NOTE — Assessment & Plan Note (Signed)
The patient's history is very suggestive of clinically significant sleep apnea.  He is also morbidly obese, and has gained 40 pounds in the last 2 years.  I have had a long discussion with him about sleep apnea, including its impact to his cardiovascular health and quality of life.  I think he would be a good candidate for home sleep testing, and the patient is agreeable.

## 2011-10-08 NOTE — Patient Instructions (Addendum)
Will schedule for home sleep testing, and arrange followup once the results are available. Work on weight reduction 

## 2011-10-08 NOTE — Progress Notes (Signed)
  Subjective:    Patient ID: Mcihael Frost, male    DOB: 1980-06-04, 31 y.o.   MRN: [id number]  HPI The patient is a 31 year old male who lives in asked to see for possible obstructive sleep apnea.  The patient states that he has loud snoring, and his wife has asked that he move to a different room to sleep.  She has also commented on an abnormal breathing pattern during sleep, and the patient describes choking arousals.  He is unrested at least 50% of the mornings, and describes sleep pressure with periods of inactivity with mundane tasks.  He also can get sleepy in the evenings watching television if he sits still.  He describes sleep pressure with driving only in the late afternoons.  The patient states that his weight is up 40 pounds over the last 2 years, and has upward score today is 10.  Sleep Questionnaire: What time do you typically go to bed?( Between what hours) 1130-12am How long does it take you to fall asleep? 30 mins How many times during the night do you wake up? 2 What time do you get out of bed to start your day? 0900 Do you drive or operate heavy machinery in your occupation? No How much has your weight changed (up or down) over the past two years? (In pounds) 40 lb (18.144 kg) Have you ever had a sleep study before? No Do you currently use CPAP? No Do you wear oxygen at any time? No    Review of Systems  Constitutional: Negative for fever and unexpected weight change.  HENT: Positive for sneezing. Negative for ear pain, nosebleeds, congestion, sore throat, rhinorrhea, trouble swallowing, dental problem, postnasal drip and sinus pressure.        Allergies  Eyes: Positive for redness and itching.  Respiratory: Positive for shortness of breath and wheezing. Negative for cough and chest tightness.   Cardiovascular: Positive for leg swelling. Negative for palpitations.  Gastrointestinal: Negative for nausea and vomiting.  Genitourinary: Negative for dysuria.  Musculoskeletal:  Negative for joint swelling.  Skin: Negative for rash.  Neurological: Negative for headaches.  Hematological: Does not bruise/bleed easily.  Psychiatric/Behavioral: Negative for dysphoric mood. The patient is not nervous/anxious.        Objective:   Physical Exam Constitutional:  Morbidly obese male, no acute distress  HENT:  Nares patent without discharge  Oropharynx without exudate, palate and uvula are mildly elongated.  Eyes:  Perrla, eomi, no scleral icterus  Neck:  No JVD, no TMG  Cardiovascular:  Normal rate, regular rhythm, no rubs or gallops.  No murmurs        Intact distal pulses  Pulmonary :  Normal breath sounds, no stridor or respiratory distress   No rales, rhonchi, or wheezing  Abdominal:  Soft, nondistended, bowel sounds present.  No tenderness noted.   Musculoskeletal:  No lower extremity edema noted.  Lymph Nodes:  No cervical lymphadenopathy noted  Skin:  No cyanosis noted  Neurologic:  Alert, appropriate, moves all 4 extremities without obvious deficit.         Assessment & Plan:

## 2011-10-17 ENCOUNTER — Ambulatory Visit (INDEPENDENT_AMBULATORY_CARE_PROVIDER_SITE_OTHER): Payer: Managed Care, Other (non HMO) | Admitting: Internal Medicine

## 2011-10-17 ENCOUNTER — Encounter: Payer: Self-pay | Admitting: Internal Medicine

## 2011-10-17 ENCOUNTER — Telehealth: Payer: Self-pay | Admitting: Internal Medicine

## 2011-10-17 VITALS — BP 118/80 | HR 77 | Temp 97.8°F | Wt >= 6400 oz

## 2011-10-17 DIAGNOSIS — M7989 Other specified soft tissue disorders: Secondary | ICD-10-CM

## 2011-10-17 NOTE — Patient Instructions (Signed)
Please schedule fasting labs prior to next visit chem7-hyperglycemia and lipid/lft-272.4

## 2011-10-27 NOTE — Assessment & Plan Note (Signed)
Resolved

## 2011-10-27 NOTE — Progress Notes (Signed)
  Subjective:    Patient ID: Aaron Frost, male    DOB: 1980-05-02, 31 y.o.   MRN: [id number]  HPI patient returns to clinic for followup of swelling. Lower extreme E. swelling resolved entirely. Has not had to use Lasix. BNP reviewed normal. Left arm pain resolved currently.  Past Medical History  Diagnosis Date  . GERD (gastroesophageal reflux disease)   . Hypertension    Past Surgical History  Procedure Date  . Tonsillectomy 2005  . External ear surgery     right ear reconstruction    reports that he quit smoking about 7 years ago. His smoking use included Cigarettes. He has a 4.5 pack-year smoking history. He has never used smokeless tobacco. He reports that he drinks alcohol. He reports that he does not use illicit drugs. family history includes Colon cancer in his paternal grandfather; Diabetes in his maternal grandfather; Heart disease in his father; and Hypertension in his mother.  There is no history of Breast cancer and Prostate cancer. Allergies  Allergen Reactions  . Aleve (Naproxen)     Burning sensation and headaches     Review of Systems see hpi     Objective:   Physical Exam  Nursing note and vitals reviewed. Constitutional: He appears well-developed and well-nourished. No distress.  HENT:  Head: Normocephalic and atraumatic.  Skin: He is not diaphoretic.          Assessment & Plan:

## 2011-11-16 ENCOUNTER — Telehealth: Payer: Self-pay | Admitting: Pulmonary Disease

## 2011-11-16 NOTE — Telephone Encounter (Signed)
Returned wife's call and advised that we did hear back from insurance and home study has been approved. I called and left a message on the patient's cell phone on 10/18/11 and never heard back from the patient to schedule.  Have scheduled the home study to be picked up on Tues. 11/20/11. Wife will come and pick up the device. Rhonda J Cobb

## 2011-11-19 ENCOUNTER — Telehealth: Payer: Self-pay | Admitting: Pulmonary Disease

## 2011-11-19 NOTE — Telephone Encounter (Signed)
Pt is calling to let the PCC's know that the home sleep test should be set for midnight tomorrow night. They will be coming by tomorrow afternoon, 11/20/11, to pick up the device and get instructions for use. I will close this msg but forward to the PCC's.

## 2011-11-22 ENCOUNTER — Ambulatory Visit (INDEPENDENT_AMBULATORY_CARE_PROVIDER_SITE_OTHER): Payer: Managed Care, Other (non HMO) | Admitting: Internal Medicine

## 2011-11-22 ENCOUNTER — Encounter: Payer: Self-pay | Admitting: Internal Medicine

## 2011-11-22 ENCOUNTER — Ambulatory Visit (HOSPITAL_BASED_OUTPATIENT_CLINIC_OR_DEPARTMENT_OTHER)
Admission: RE | Admit: 2011-11-22 | Discharge: 2011-11-22 | Disposition: A | Discharge status: Home / Self Care | Payer: Managed Care, Other (non HMO) | Source: Ambulatory Visit | Attending: Internal Medicine | Admitting: Internal Medicine

## 2011-11-22 VITALS — BP 124/86 | HR 85 | Temp 98.4°F | Resp 18 | Wt >= 6400 oz

## 2011-11-22 DIAGNOSIS — M7730 Calcaneal spur, unspecified foot: Secondary | ICD-10-CM | POA: Insufficient documentation

## 2011-11-22 DIAGNOSIS — M79672 Pain in left foot: Secondary | ICD-10-CM

## 2011-11-22 DIAGNOSIS — M79609 Pain in unspecified limb: Secondary | ICD-10-CM | POA: Insufficient documentation

## 2011-11-22 DIAGNOSIS — M79673 Pain in unspecified foot: Secondary | ICD-10-CM

## 2011-11-23 ENCOUNTER — Ambulatory Visit (INDEPENDENT_AMBULATORY_CARE_PROVIDER_SITE_OTHER): Payer: Managed Care, Other (non HMO) | Admitting: Pulmonary Disease

## 2011-11-23 ENCOUNTER — Telehealth: Payer: Self-pay | Admitting: Pulmonary Disease

## 2011-11-23 DIAGNOSIS — G4733 Obstructive sleep apnea (adult) (pediatric): Secondary | ICD-10-CM

## 2011-11-23 NOTE — Telephone Encounter (Signed)
LMOMTCB x 1 for [place]. Pt needs OV to discuss sleep results with KC. Can use any open slot or any 4:30 held slot, except on Mondays.

## 2011-11-23 NOTE — Telephone Encounter (Signed)
Pt needs ov to review sleep study. Please call wife michelle at [phone]

## 2011-11-24 DIAGNOSIS — M79672 Pain in left foot: Secondary | ICD-10-CM | POA: Insufficient documentation

## 2011-11-24 NOTE — Assessment & Plan Note (Signed)
Obtain plain x-ray of left foot. Consider specialist referral if x-ray unrevealing.

## 2011-11-24 NOTE — Progress Notes (Signed)
  Subjective:    Patient ID: Aaron Frost, male    DOB: July 10, 1980, 31 y.o.   MRN: [id number]  HPI patient presents to clinic for evaluation of left foot pain. Notes a month and a half history of left foot pain located on the medial aspect of the heel. Pain worsens with weightbearing. No injury or trauma. States feels similar to the pain he had with previous metatarsal fracture but in a different location. No other alleviating or exacerbating factors. Status post influenza vaccine for the season already.  Past Medical History  Diagnosis Date  . GERD (gastroesophageal reflux disease)   . Hypertension    Past Surgical History  Procedure Date  . Tonsillectomy 2005  . External ear surgery     right ear reconstruction    reports that he quit smoking about 7 years ago. His smoking use included Cigarettes. He has a 4.5 pack-year smoking history. He has never used smokeless tobacco. He reports that he drinks alcohol. He reports that he does not use illicit drugs. family history includes Colon cancer in his paternal grandfather; Diabetes in his maternal grandfather; Heart disease in his father; and Hypertension in his mother.  There is no history of Breast cancer and Prostate cancer. Allergies  Allergen Reactions  . Aleve (Naproxen)     Burning sensation and headaches     Review of Systems see hpi     Objective:   Physical Exam  Nursing note and vitals reviewed. Constitutional: He appears well-developed and well-nourished. No distress.  HENT:  Head: Normocephalic and atraumatic.  Musculoskeletal:       Left heel-nontender without bony abnormality. Full range of motion of the foot. Able to weight-bear and ambulate without assistance.  Neurological: He is alert.  Skin: Skin is warm and dry. He is not diaphoretic.  Psychiatric: He has a normal mood and affect.          Assessment & Plan:

## 2011-11-26 NOTE — Telephone Encounter (Signed)
LMOMTCB x 1 for [place].

## 2011-11-30 NOTE — Telephone Encounter (Signed)
Pt is scheduled for sleep follow-up on 12/14/11 @ 11am. Pt cancelled appt on 12/11/11 d/t his work schedule.

## 2011-12-04 ENCOUNTER — Encounter: Payer: Self-pay | Admitting: *Deleted

## 2011-12-11 ENCOUNTER — Ambulatory Visit: Payer: Managed Care, Other (non HMO) | Admitting: Pulmonary Disease

## 2011-12-12 ENCOUNTER — Telehealth: Payer: Self-pay | Admitting: Internal Medicine

## 2011-12-12 ENCOUNTER — Other Ambulatory Visit: Payer: Self-pay | Admitting: Internal Medicine

## 2011-12-12 DIAGNOSIS — M79673 Pain in unspecified foot: Secondary | ICD-10-CM

## 2011-12-12 NOTE — Telephone Encounter (Signed)
Referral order placed.

## 2011-12-12 NOTE — Telephone Encounter (Signed)
Patients wife called stating that they received a letter stating for them to call the office if patient would like to proceed with referral to a specialist regarding his foot pain. He would like to proceed with referral.

## 2011-12-14 ENCOUNTER — Ambulatory Visit (INDEPENDENT_AMBULATORY_CARE_PROVIDER_SITE_OTHER): Payer: Managed Care, Other (non HMO) | Admitting: Pulmonary Disease

## 2011-12-14 ENCOUNTER — Encounter: Payer: Self-pay | Admitting: Pulmonary Disease

## 2011-12-14 VITALS — BP 150/88 | HR 105 | Temp 98.0°F | Ht 74.0 in | Wt >= 6400 oz

## 2011-12-14 DIAGNOSIS — G4733 Obstructive sleep apnea (adult) (pediatric): Secondary | ICD-10-CM

## 2011-12-14 NOTE — Assessment & Plan Note (Signed)
The patient has severe obstructive sleep apnea by his recent sleep study, and will therefore need aggressive treatment with CPAP while working on weight loss.  He is agreeable to this approach. I will set the patient up on cpap at a moderate pressure level to allow for desensitization, and will troubleshoot the device over the next 4-6weeks if needed.  The pt is to call me if having issues with tolerance.  Will then optimize the pressure once patient is able to wear cpap on a consistent basis.

## 2011-12-14 NOTE — Progress Notes (Signed)
  Subjective:    Patient ID: Aaron Frost, male    DOB: 10-08-1980, 31 y.o.   MRN: [id number]  HPI The patient comes in today for followup of his recent sleep study.  He was found to have severe obstructive sleep apnea, with an AHI of 65 events per hour.  I have reviewed this study with him in detail, and answered all of his questions.   Review of Systems  Constitutional: Negative for fever and unexpected weight change.  HENT: Positive for rhinorrhea, sneezing and postnasal drip. Negative for ear pain, nosebleeds, congestion, sore throat, trouble swallowing, dental problem and sinus pressure.        Allergy related  Eyes: Negative for redness and itching.  Respiratory: Positive for wheezing. Negative for cough, chest tightness and shortness of breath.   Cardiovascular: Positive for leg swelling. Negative for palpitations.       Patient has heel spurs causing inflammation in lower legs  Gastrointestinal: Negative for nausea and vomiting.  Genitourinary: Negative for dysuria.  Musculoskeletal: Positive for arthralgias ( knee). Negative for joint swelling.  Skin: Negative for rash.  Neurological: Negative for headaches.  Hematological: Does not bruise/bleed easily.  Psychiatric/Behavioral: Negative for dysphoric mood. The patient is not nervous/anxious.        Objective:   Physical Exam Morbidly obese male in no acute distress Nose without purulence or discharge noted Neck without lymphadenopathy or thyromegaly Lower extremities with mild edema, no cyanosis Alert and oriented, moves all 4 extremities.       Assessment & Plan:

## 2011-12-14 NOTE — Patient Instructions (Addendum)
Will start on cpap.  Please call me if having issues with tolerance. Work on weight loss followup with me in 6 weeks.

## 2012-01-16 ENCOUNTER — Ambulatory Visit: Payer: Managed Care, Other (non HMO) | Admitting: Internal Medicine

## 2012-01-25 ENCOUNTER — Ambulatory Visit: Payer: Managed Care, Other (non HMO) | Admitting: Pulmonary Disease

## 2012-02-01 ENCOUNTER — Encounter: Payer: Self-pay | Admitting: Pulmonary Disease

## 2012-02-01 ENCOUNTER — Ambulatory Visit (INDEPENDENT_AMBULATORY_CARE_PROVIDER_SITE_OTHER): Payer: Managed Care, Other (non HMO) | Admitting: Pulmonary Disease

## 2012-02-01 VITALS — BP 120/72 | HR 66 | Temp 98.0°F | Ht 74.0 in | Wt >= 6400 oz

## 2012-02-01 DIAGNOSIS — G4733 Obstructive sleep apnea (adult) (pediatric): Secondary | ICD-10-CM

## 2012-02-01 NOTE — Patient Instructions (Addendum)
Continue with cpap.  You are doing great. Work on [employer] loss Will optimize your pressure on the auto setting for the next 2 weeks.  Will let you know your set pressure. followup with me in 6mos if doing well.

## 2012-02-01 NOTE — Progress Notes (Signed)
  Subjective:    Patient ID: Aaron Frost, male    DOB: Oct 25, 1980, 32 y.o.   MRN: [id number]  HPI The patient comes in today for followup of his known obstructive sleep apnea.  He was started on CPAP a moderate pressure at the last visit, and has responded very well to the treatment.  Although he has not seen a big impact on his sleep, he has seen much improvement in his energy level and also his alertness during the day.  He is having no issues with the mask fit or the pressure.   Review of Systems  Constitutional: Negative for fever and unexpected weight change.  HENT: Positive for congestion, rhinorrhea and postnasal drip. Negative for ear pain, nosebleeds, sore throat, sneezing, trouble swallowing, dental problem and sinus pressure.   Eyes: Negative for redness and itching.  Respiratory: Negative for cough, chest tightness, shortness of breath and wheezing.   Cardiovascular: Positive for leg swelling. Negative for palpitations.  Gastrointestinal: Negative for nausea and vomiting.  Genitourinary: Negative for dysuria.  Musculoskeletal: Positive for joint swelling ( pain in knee).  Skin: Negative for rash.  Neurological: Negative for headaches.  Hematological: Does not bruise/bleed easily.  Psychiatric/Behavioral: Negative for dysphoric mood. The patient is not nervous/anxious.        Objective:   Physical Exam Obese male in no acute distress Nose without purulence or discharge noted No skin breakdown or pressure necrosis from the CPAP mask No lymphadenopathy or thyromegaly Lower extremities with mild edema, cyanosis Alert, does not appear to be sleepy, moves all 4 extremities.       Assessment & Plan:

## 2012-02-01 NOTE — Assessment & Plan Note (Signed)
The patient is wearing CPAP compliantly, and is doing very well with the device.  He has seen improvement in his alertness and energy level during the day, and at this point we need to optimize his pressure.  I have also encouraged him to work aggressively on weight loss. Care Plan:  At this point, will arrange for the patient's machine to be changed over to auto mode for 2 weeks to optimize their pressure.  I will review the downloaded data once sent by dme, and also evaluate for compliance, leaks, and residual osa.  I will call the patient and dme to discuss the results, and have the patient's machine set appropriately.  This will serve as the pt's cpap pressure titration.

## 2012-03-22 ENCOUNTER — Other Ambulatory Visit: Payer: Self-pay | Admitting: Pulmonary Disease

## 2012-03-22 DIAGNOSIS — G4733 Obstructive sleep apnea (adult) (pediatric): Secondary | ICD-10-CM

## 2012-04-23 ENCOUNTER — Encounter: Payer: Self-pay | Admitting: Pulmonary Disease

## 2012-04-23 ENCOUNTER — Ambulatory Visit (INDEPENDENT_AMBULATORY_CARE_PROVIDER_SITE_OTHER): Payer: Managed Care, Other (non HMO) | Admitting: Pulmonary Disease

## 2012-04-23 VITALS — BP 138/70 | HR 76 | Temp 98.5°F | Ht 74.0 in | Wt >= 6400 oz

## 2012-04-23 DIAGNOSIS — G4733 Obstructive sleep apnea (adult) (pediatric): Secondary | ICD-10-CM

## 2012-04-23 NOTE — Assessment & Plan Note (Signed)
The patient is doing better with CPAP, however I suspect he will be better off on the fixed pressure.  I will have his machine placed on 12 cm of water, and I have asked him to continue working on mask fit.  He is to call us if leaking becomes a major problem again.  I have asked him to keep up with his mask changes and supplies, and to work aggressively on weight loss.

## 2012-04-23 NOTE — Patient Instructions (Addendum)
Will have your machine set on a pressure of 12cm fixed. Let us know if your mask has sealing issues. Work on weight loss followup with me in 6mos, and cancel your apptm for July.  Please call if things are not going well with cpap.

## 2012-04-23 NOTE — Progress Notes (Signed)
  Subjective:    Patient ID: Aaron Frost, male    DOB: 25-Nov-1980, 32 y.o.   MRN: [id number]  HPI The patient comes in today for followup of his obstructive sleep apnea.  He has been doing better with CPAP, however doesn't feel like the automatic pressure is getting high enough.  I have looked at his downloaded today, and his optimal pressure appears to be 12 cm of water.  His AHI on this setting appears well controlled.  The patient is still having some issues with mask leak, but it is much improved from his previous downloaded.  The patient feels that he is sleeping better since being on CPAP, and his daytime alertness has improved but not normalized.   Review of Systems  Constitutional: Negative for fever and unexpected weight change.  HENT: Positive for sinus pressure. Negative for ear pain, nosebleeds, congestion, sore throat, rhinorrhea, sneezing, trouble swallowing, dental problem and postnasal drip.        Allergies  Eyes: Negative for redness and itching.  Respiratory: Positive for shortness of breath. Negative for cough, chest tightness and wheezing.   Cardiovascular: Positive for leg swelling ( with heat). Negative for palpitations.  Gastrointestinal: Negative for nausea and vomiting.  Genitourinary: Negative for dysuria.  Musculoskeletal: Negative for joint swelling.  Skin: Negative for rash.  Neurological: Positive for headaches ( sinus HA).  Hematological: Does not bruise/bleed easily.  Psychiatric/Behavioral: Negative for dysphoric mood. The patient is not nervous/anxious.        Objective:   Physical Exam Obese male in nad Nose without purulence or discharge noted. No skin breakdown or pressure necrosis from the cpap mask.  Neck without LN or TMG LE with mild edema, no cyanosis Alert, does not appear to be sleepy, moves all 4.        Assessment & Plan:

## 2012-07-26 ENCOUNTER — Ambulatory Visit (INDEPENDENT_AMBULATORY_CARE_PROVIDER_SITE_OTHER): Payer: Managed Care, Other (non HMO) | Admitting: Family Medicine

## 2012-07-26 ENCOUNTER — Encounter: Payer: Self-pay | Admitting: Family Medicine

## 2012-07-26 VITALS — BP 124/80 | HR 80 | Temp 97.7°F | Ht 74.0 in | Wt >= 6400 oz

## 2012-07-26 DIAGNOSIS — M79609 Pain in unspecified limb: Secondary | ICD-10-CM

## 2012-07-26 DIAGNOSIS — M79605 Pain in left leg: Secondary | ICD-10-CM

## 2012-07-26 NOTE — Progress Notes (Signed)
OFFICE NOTE  07/26/2012  CC:  Chief Complaint  Patient presents with  . Leg Pain     HPI: Patient is a 32 y.o. Caucasian male who is here for leg pain.  Onset of left leg about 1 wk ago in hamstring area.  No preceding traumatic incident. He's had this kind of pain before--every 6-10 months.  It typically takes about a week or so to spontaneously resolve.  He was put on a diuretic at one point for LE swelling. No tylenol or motrin has been tried this time b/c in the past it hasn't affected it much.  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  No recent surgery, no hx of DVT, no fh of DVT.   Pertinent PMH:  Past Medical History  Diagnosis Date  . GERD (gastroesophageal reflux disease)   . Hypertension    Past Surgical History  Procedure Laterality Date  . Tonsillectomy  2005  . External ear surgery      right ear reconstruction    MEDS:  Outpatient Prescriptions Prior to Visit  Medication Sig Dispense Refill  . NON FORMULARY Vitamin C Suspension Supplement.      Marland Kitchen omeprazole (PRILOSEC) 40 MG capsule Take 20 mg by mouth daily.       No facility-administered medications prior to visit.    PE: There were no vitals taken for this visit. Gen: Alert, well appearing, morbidly obese white male in NAD.  Patient is oriented to person, place, time, and situation. CV: RRR, no m/r/g.   LUNGS: CTA bilat, nonlabored resps, good aeration in all lung fields. LEGS: No tenderness, no excessive warmth, no mass or subQ induration, no edema is noted.  There is a bit of hemosiderin skin pigmentary changes in both pretibial regions.  ROM of LE's normal, without any pain in any muscles with resistance to ROM   IMPRESSION AND PLAN:   Leg pain, likely benign soft tissue/hamstring pain.  Due to sedentary lifestyle/job, will check d-dimer and if pos will proceed with LE venous doppler.  FOLLOW UP:  Depending on result of d-dimer.

## 2012-07-28 ENCOUNTER — Telehealth: Payer: Self-pay | Admitting: Family Medicine

## 2012-07-28 NOTE — Telephone Encounter (Signed)
I called pt Saturday evening 07/26/12 to let him know that his D-dimer after I saw him in Saturday clinic was normal.  I left voicemail message for him to call me back but he never called back. Pls call and let him know that the blood test to look for evidence of blood clot was NORMAL.  No sign of blood clot to explain his leg pain.-thx

## 2012-07-29 NOTE — Telephone Encounter (Signed)
Patient aware.

## 2012-08-01 ENCOUNTER — Ambulatory Visit: Payer: Managed Care, Other (non HMO) | Admitting: Pulmonary Disease

## 2012-10-23 ENCOUNTER — Ambulatory Visit: Payer: Managed Care, Other (non HMO) | Admitting: Pulmonary Disease

## 2012-11-28 ENCOUNTER — Ambulatory Visit: Payer: Managed Care, Other (non HMO) | Admitting: Pulmonary Disease

## 2012-12-24 ENCOUNTER — Ambulatory Visit (HOSPITAL_BASED_OUTPATIENT_CLINIC_OR_DEPARTMENT_OTHER)
Admission: RE | Admit: 2012-12-24 | Discharge: 2012-12-24 | Disposition: A | Discharge status: Home / Self Care | Payer: Managed Care, Other (non HMO) | Source: Ambulatory Visit | Attending: Physician Assistant | Admitting: Physician Assistant

## 2012-12-24 ENCOUNTER — Encounter: Payer: Self-pay | Admitting: Physician Assistant

## 2012-12-24 ENCOUNTER — Telehealth: Payer: Self-pay | Admitting: Physician Assistant

## 2012-12-24 ENCOUNTER — Ambulatory Visit (INDEPENDENT_AMBULATORY_CARE_PROVIDER_SITE_OTHER): Payer: Managed Care, Other (non HMO) | Admitting: Physician Assistant

## 2012-12-24 VITALS — BP 124/88 | HR 63 | Temp 98.0°F | Resp 16 | Ht 74.0 in | Wt 397.0 lb

## 2012-12-24 DIAGNOSIS — K5900 Constipation, unspecified: Secondary | ICD-10-CM

## 2012-12-24 DIAGNOSIS — R109 Unspecified abdominal pain: Secondary | ICD-10-CM | POA: Insufficient documentation

## 2012-12-24 DIAGNOSIS — R1013 Epigastric pain: Secondary | ICD-10-CM

## 2012-12-24 DIAGNOSIS — R1033 Periumbilical pain: Secondary | ICD-10-CM

## 2012-12-24 DIAGNOSIS — K439 Ventral hernia without obstruction or gangrene: Secondary | ICD-10-CM | POA: Insufficient documentation

## 2012-12-24 DIAGNOSIS — R10819 Abdominal tenderness, unspecified site: Secondary | ICD-10-CM | POA: Insufficient documentation

## 2012-12-24 LAB — COMPREHENSIVE METABOLIC PANEL
ALT: 40 U/L (ref 0–53)
CO2: 27 mEq/L (ref 19–32)
Calcium: 9.1 mg/dL (ref 8.4–10.5)
Chloride: 103 mEq/L (ref 96–112)
Glucose, Bld: 98 mg/dL (ref 70–99)
Sodium: 138 mEq/L (ref 135–145)
Total Protein: 7 g/dL (ref 6.0–8.3)

## 2012-12-24 LAB — CBC WITH DIFFERENTIAL/PLATELET
Eosinophils Relative: 4 % (ref 0–5)
Hemoglobin: 15.4 g/dL (ref 13.0–17.0)
Lymphocytes Relative: 29 % (ref 12–46)
Lymphs Abs: 2.1 10*3/uL (ref 0.7–4.0)
MCH: 27.6 pg (ref 26.0–34.0)
MCV: 80.1 fL (ref 78.0–100.0)
Monocytes Relative: 8 % (ref 3–12)
Platelets: 279 10*3/uL (ref 150–400)
RBC: 5.57 MIL/uL (ref 4.22–5.81)
WBC: 7.2 10*3/uL (ref 4.0–10.5)

## 2012-12-24 LAB — LIPASE: Lipase: 15 U/L (ref 0–75)

## 2012-12-24 NOTE — Assessment & Plan Note (Signed)
CBC, CMP, Lipase.  H. Pylori.  Will obtain Imaging to assess for constipation, etc. Increase fluid intake.  Fiber supplement and probiotic.  Stool softener.  Fleet's enema if no bowel movement in several days.

## 2012-12-24 NOTE — Progress Notes (Signed)
Patient ID: Aaron Frost, male   DOB: 1980/03/07, 32 y.o.   MRN: [id number]  Patient presents to clinic today c/o periumbilical pain that is intermittent over the past 2 weeks.  Patient states pain is throbbing in nature.  No association of pain with mealtime.  Patient is morbidly obese.  States pain is worse with leaning forward.  Endorses history of a hernia around his umbilicus.  Patient endorses nausea.  Denies vomiting.  Endorses constipation over the past few weeks.  Denies history of abdominal surgery.  Patient has good appetite.  Denies fever, chills, aches, recent travel or abnormal food/water source.  Patient denies hematochezia or melena.  Has to strain with bowel movement.  States his stools have been very loose and small in quantity.  Past Medical History  Diagnosis Date  . GERD (gastroesophageal reflux disease)   . Hypertension     Current Outpatient Prescriptions on File Prior to Visit  Medication Sig Dispense Refill  . [DISCONTINUED] hydrochlorothiazide 25 MG tablet Take 1 tablet (25 mg total) by mouth daily.  30 tablet  11   No current facility-administered medications on file prior to visit.    Allergies  Allergen Reactions  . Aleve [Naproxen]     Burning sensation and headaches    Family History  Problem Relation Age of Onset  . Breast cancer Neg Hx   . Prostate cancer Neg Hx   . Colon cancer Paternal Grandfather   . Heart disease Father   . Hypertension Mother     grandfather/uncles  . Diabetes Maternal Grandfather     History   Social History  . Marital Status: Married    Spouse Name: N/A    Number of Children: N/A  . Years of Education: N/A   Occupational History  . customer service    Social History Main Topics  . Smoking status: Former Smoker -- 1.50 packs/day for 3 years    Types: Cigarettes    Quit date: 01/09/2004  . Smokeless tobacco: Never Used  . Alcohol Use: Yes     Comment: 2 x month---glass of wine occasionally  . Drug Use: No   . Sexual Activity: None   Other Topics Concern  . None   Social History Narrative  . None   Review of Systems - See HPI.  All other ROS are negative.  Filed Vitals:   12/24/12 0758  BP: 124/88  Pulse: 63  Temp: 98 F (36.7 C)  Resp: 16    Physical Exam  Vitals reviewed. Constitutional:  Well-developed, morbidly obese caucasian gentleman in no acute distress.  HENT:  Head: Normocephalic and atraumatic.  Right Ear: External ear normal.  Left Ear: External ear normal.  Nose: Nose normal.  Mouth/Throat: Oropharynx is clear and moist. No oropharyngeal exudate.  Eyes: Conjunctivae are normal.  Neck: Neck supple.  Cardiovascular: Normal rate, regular rhythm, normal heart sounds and intact distal pulses.   Pulmonary/Chest: Breath sounds normal. No respiratory distress. He has no wheezes. He has no rales. He exhibits no tenderness.  Abdominal: Soft. Bowel sounds are normal. He exhibits no distension. There is no rebound and no guarding.  Positive for periumbilical and epigastric tenderness.  Palpable mass noted just superior to umbilicus.  Mass only palpable when patient bears down.    Lymphadenopathy:    He has no cervical adenopathy.     Assessment/Plan: Umbilical pain CBC, CMP, Lipase.  H. Pylori.  Will obtain Imaging to assess for constipation, etc. Increase fluid intake.  Fiber supplement  and probiotic.  Stool softener.  Fleet's enema if no bowel movement in several days.

## 2012-12-24 NOTE — Telephone Encounter (Signed)
Please inform patient that his X-ray showed a lot of gas in his bowels.  No evidence of bowel obstruction or excessive amount of stool  Giving pain and mass on exam, will proceed with a CT scan of his abdomen.  Lab results are pending.

## 2012-12-24 NOTE — Progress Notes (Signed)
Pre visit review using our clinic review tool, if applicable. No additional management support is needed unless otherwise documented below in the visit note/SLS  

## 2012-12-24 NOTE — Patient Instructions (Signed)
Please obtain labs.  Then proceed to the first floor for imaging.  I will call you with all of your results.  Increase fluid intake.  Take a fiber supplement and probiotic.  Fleets enema.  Resume Prilosec (omeprazole) daily.

## 2012-12-24 NOTE — Telephone Encounter (Signed)
Patient Unavailable; spouse informed, understood & agreed/SLS  

## 2012-12-25 LAB — H. PYLORI ANTIBODY, IGG: H Pylori IgG: <0.4 {ISR}

## 2012-12-26 ENCOUNTER — Telehealth: Payer: Self-pay | Admitting: Physician Assistant

## 2012-12-26 NOTE — Telephone Encounter (Signed)
PLEASE CALL WITH RESULTS

## 2012-12-29 NOTE — Telephone Encounter (Signed)
Notes Recorded by Piedad Climes, PA-C on 12/26/2012 at 7:45 AM Please inform patient that his H. Pylori screening was negative. I will call him with the results of his CT scan once it has been performed. If symptoms acutely worsen, he should return to clinic or proceed to the ER. Notes Recorded by Regis Bill, CMA on 12/24/2012 at 5:02 PM [facility] with contact name and number for return call RE: Results and further provider instructions/SLS Notes Recorded by Piedad Climes, PA-C on 12/24/2012 at 4:35 PM Please inform patient that so far his lab results are good. Still waiting on result of H. Pylori screening. He should have been contacted by our schedulers for a CT abdomen.  LMOM with contact name and number RE: results and further provider instructions/SLS

## 2012-12-30 ENCOUNTER — Ambulatory Visit: Payer: Managed Care, Other (non HMO) | Admitting: Pulmonary Disease

## 2013-01-07 ENCOUNTER — Other Ambulatory Visit: Payer: Self-pay | Admitting: Physician Assistant

## 2013-01-07 DIAGNOSIS — R1033 Periumbilical pain: Secondary | ICD-10-CM

## 2013-01-12 ENCOUNTER — Ambulatory Visit: Payer: Managed Care, Other (non HMO) | Admitting: Physician Assistant

## 2013-01-26 ENCOUNTER — Ambulatory Visit (INDEPENDENT_AMBULATORY_CARE_PROVIDER_SITE_OTHER): Payer: BC Managed Care – PPO | Admitting: Physician Assistant

## 2013-01-26 ENCOUNTER — Encounter: Payer: Self-pay | Admitting: Physician Assistant

## 2013-01-26 VITALS — BP 126/88 | HR 65 | Temp 97.7°F | Resp 18 | Ht 74.0 in | Wt >= 6400 oz

## 2013-01-26 DIAGNOSIS — K439 Ventral hernia without obstruction or gangrene: Secondary | ICD-10-CM

## 2013-01-26 NOTE — Progress Notes (Signed)
Pre visit review using our clinic review tool, if applicable. No additional management support is needed unless otherwise documented below in the visit note/SLS  

## 2013-01-26 NOTE — Progress Notes (Signed)
Patient presents to clinic today for follow-up of ventral hernia and constipation.  Patient has been following our recommendations for diet and exercise.  Is walking daily.  Endorses eating a high-fiber diet and increasing fluid intake.  Endorses his bowel movements are now regular.  LBM this am with no abnormalities.  Denies tenesmus, melena or hematochezia.  Denies N/V.  Patient sill endorses a mass above his belly button that enlarges with leaning forward.  Patient was scheduled for CT scan of abdomen due to lack of bowel movement and abdominal pain/mass at last visit.  Patient canceled imaging.  Discussed with patient that due to his body habitus it is hard to get a good assessment of hernia size with palpation alone.  Patient denies significant abdominal pain.  Denies enlargement of hernia from last visit.   Past Medical History  Diagnosis Date  . GERD (gastroesophageal reflux disease)   . Hypertension     Current Outpatient Prescriptions on File Prior to Visit  Medication Sig Dispense Refill  . diphenhydrAMINE (BENADRYL) 25 MG tablet Take 25 mg by mouth every 6 (six) hours as needed.      Marland Kitchen ibuprofen (ADVIL,MOTRIN) 200 MG tablet Take 200 mg by mouth every 6 (six) hours as needed.      . magnesium hydroxide (MILK OF MAGNESIA) 400 MG/5ML suspension Take by mouth daily as needed for mild constipation.      . [DISCONTINUED] hydrochlorothiazide 25 MG tablet Take 1 tablet (25 mg total) by mouth daily.  30 tablet  11   No current facility-administered medications on file prior to visit.    Allergies  Allergen Reactions  . Aleve [Naproxen]     Burning sensation and headaches    Family History  Problem Relation Age of Onset  . Breast cancer Neg Hx   . Prostate cancer Neg Hx   . Colon cancer Paternal Grandfather   . Heart disease Father   . Hypertension Mother     grandfather/uncles  . Diabetes Maternal Grandfather     History   Social History  . Marital Status: Married    Spouse  Name: N/A    Number of Children: N/A  . Years of Education: N/A   Occupational History  . customer service    Social History Main Topics  . Smoking status: Former Smoker -- 1.50 packs/day for 3 years    Types: Cigarettes    Quit date: 01/09/2004  . Smokeless tobacco: Never Used  . Alcohol Use: Yes     Comment: 2 x month---glass of wine occasionally  . Drug Use: No  . Sexual Activity: None   Other Topics Concern  . None   Social History Narrative  . None   Review of Systems - See HPI.  All other ROS are negative.  Filed Vitals:   01/26/13 0726  BP: 126/88  Pulse: 65  Temp: 97.7 F (36.5 C)  Resp: 18   Physical Exam  Vitals reviewed. Constitutional: He is oriented to person, place, and time.  Morbidly obese, well-developed caucasian gentleman in no acute distress.  HENT:  Head: Normocephalic and atraumatic.  Eyes: Conjunctivae are normal. Pupils are equal, round, and reactive to light.  Neck: Neck supple.  Cardiovascular: Normal rate, regular rhythm, normal heart sounds and intact distal pulses.   Pulmonary/Chest: Effort normal and breath sounds normal. No respiratory distress. He has no wheezes. He has no rales. He exhibits no tenderness.  Abdominal: Soft. Bowel sounds are normal. He exhibits no distension. There is  no tenderness. There is no rebound and no guarding.  Neurological: He is alert and oriented to person, place, and time.  Skin: Skin is warm and dry. No rash noted.  Psychiatric: Affect normal.   Recent Results (from the past 2160 hour(s))  CBC WITH DIFFERENTIAL     Status: None   Collection Time    12/24/12  8:42 AM      Result Value Range   WBC 7.2  4.0 - 10.5 K/uL   RBC 5.57  4.22 - 5.81 MIL/uL   Hemoglobin 15.4  13.0 - 17.0 g/dL   HCT 44.6  39.0 - 52.0 %   MCV 80.1  78.0 - 100.0 fL   MCH 27.6  26.0 - 34.0 pg   MCHC 34.5  30.0 - 36.0 g/dL   RDW 14.4  11.5 - 15.5 %   Platelets 279  150 - 400 K/uL   Neutrophils Relative % 58  43 - 77 %    Neutro Abs 4.3  1.7 - 7.7 K/uL   Lymphocytes Relative 29  12 - 46 %   Lymphs Abs 2.1  0.7 - 4.0 K/uL   Monocytes Relative 8  3 - 12 %   Monocytes Absolute 0.5  0.1 - 1.0 K/uL   Eosinophils Relative 4  0 - 5 %   Eosinophils Absolute 0.3  0.0 - 0.7 K/uL   Basophils Relative 1  0 - 1 %   Basophils Absolute 0.0  0.0 - 0.1 K/uL   Smear Review Criteria for review not met    COMPREHENSIVE METABOLIC PANEL     Status: None   Collection Time    12/24/12  8:42 AM      Result Value Range   Sodium 138  135 - 145 mEq/L   Potassium 4.3  3.5 - 5.3 mEq/L   Chloride 103  96 - 112 mEq/L   CO2 27  19 - 32 mEq/L   Glucose, Bld 98  70 - 99 mg/dL   BUN 11  6 - 23 mg/dL   Creat 0.85  0.50 - 1.35 mg/dL   Total Bilirubin 0.7  0.3 - 1.2 mg/dL   Alkaline Phosphatase 66  39 - 117 U/L   AST 28  0 - 37 U/L   ALT 40  0 - 53 U/L   Total Protein 7.0  6.0 - 8.3 g/dL   Albumin 4.1  3.5 - 5.2 g/dL   Calcium 9.1  8.4 - 10.5 mg/dL  LIPASE     Status: None   Collection Time    12/24/12  8:42 AM      Result Value Range   Lipase 15  0 - 75 U/L  H. PYLORI ANTIBODY, IGG     Status: None   Collection Time    12/24/12  8:42 AM      Result Value Range   H Pylori IgG <0.40     Comment: No significant level of IgG antibody to H. pylori detected.              ISR = Immune Status Ratio                  <0.90         ISR       Negative                  0.90 - 1.09   ISR       Equivocal                  >=  1.10        ISR       Positive           The above results were obtained with the Immulite 2000 H. pylori IgG     EIA.  Results obtained from other manufacturer's assay methods may not     be used interchangeably.          Assessment/Plan: No problem-specific assessment & plan notes found for this encounter.

## 2013-01-26 NOTE — Assessment & Plan Note (Signed)
Asymptomatic at present.  Is reducible.  Question fat herniation versus herniation of bowel.  Will need to obtain CT abdomen for further evaluation, especially giving patient's morbid obesity.  Patient wishes to hold off until spring due to insurance reasons.  Patient educated on alarm symptoms and when to return to clinic or proceed to ER.  Follow-up in 3 months.

## 2013-01-26 NOTE — Patient Instructions (Signed)
Please continue to walk daily as exercise promotes intestinal motility.  Keep maintaining good fluid intake and a high-fiber diet.  As long as you remain without symptoms, we will monitor the hernia.  We will need Imaging to further characterize the hernia, however this is no an urgent matter.  Please return to clinic in 3 months for follow-up.  Return sooner if you need [place].  Hernia A hernia happens when an organ inside your body pushes out through a weak spot in your belly (abdominal) wall. Most hernias get worse over time. They can often be pushed back into place (reduced). Surgery may be needed to repair hernias that cannot be pushed into place. HOME CARE  Keep doing normal activities.  Avoid lifting more than 10 pounds (4.5 kilograms).  Cough gently and avoid straining. Over time, these things will:  Increase your hernia size.  Irritate your hernia.  Break down hernia repairs.  Stop smoking.  Do not wear anything tight over your hernia. Do not keep the hernia in with an outside bandage.  Eat food that is high in fiber (fruit, vegetables, whole grains).  Drink enough fluids to keep your pee (urine) clear or pale yellow.  Take medicines to make your poop soft (stool softeners) if you cannot poop (constipated). GET HELP RIGHT AWAY IF:   You have a fever.  You have belly pain that gets worse.  You feel sick to your stomach (nauseous) and throw up (vomit).  Your skin starts to bulge out.  Your hernia turns a different color, feels hard, or is tender.  You have increased pain or puffiness (swelling) around the hernia.  You poop more or less often.  Your poop does not look the way normally does.  You have watery poop (diarrhea).  You cannot push the hernia back in place by applying gentle pressure while lying down. MAKE SURE YOU:   Understand these instructions.  Will watch your condition.  Will get help right away if you are not doing well or get worse. Document  Released: 06/14/2009 Document Revised: 03/19/2011 Document Reviewed: 06/14/2009 [facility] Patient Information 2014 [place].

## 2013-02-16 ENCOUNTER — Ambulatory Visit: Payer: Managed Care, Other (non HMO) | Admitting: Pulmonary Disease

## 2013-03-10 ENCOUNTER — Ambulatory Visit: Payer: BC Managed Care – PPO | Admitting: Pulmonary Disease

## 2013-04-22 NOTE — Telephone Encounter (Signed)
Lab order

## 2013-04-29 ENCOUNTER — Ambulatory Visit: Payer: BC Managed Care – PPO | Admitting: Physician Assistant

## 2013-04-29 DIAGNOSIS — Z0289 Encounter for other administrative examinations: Secondary | ICD-10-CM

## 2013-07-01 ENCOUNTER — Ambulatory Visit: Payer: BC Managed Care – PPO | Admitting: Physician Assistant

## 2014-01-08 DIAGNOSIS — K429 Umbilical hernia without obstruction or gangrene: Secondary | ICD-10-CM

## 2014-01-08 HISTORY — DX: Umbilical hernia without obstruction or gangrene: K42.9

## 2014-02-01 ENCOUNTER — Ambulatory Visit (INDEPENDENT_AMBULATORY_CARE_PROVIDER_SITE_OTHER): Payer: BLUE CROSS/BLUE SHIELD | Admitting: Medical

## 2014-02-01 ENCOUNTER — Encounter: Payer: Self-pay | Admitting: Medical

## 2014-02-01 VITALS — BP 153/97 | HR 78 | Temp 97.5°F | Ht 74.0 in | Wt 368.0 lb

## 2014-02-01 DIAGNOSIS — R1084 Generalized abdominal pain: Secondary | ICD-10-CM

## 2014-02-01 NOTE — Patient Instructions (Addendum)
I want to get labs and abdominal ultrasound for your abdominal pain. You have some features of reflux but may have other diagnosis.   Please eat healthy and consider zantac otc.  Eat normal healthy diet. Include fruits and vegetables. If loose stool occur then get stool culture.  Follow up in 7 days or as needed.  If at any point severe abdomen pain or worsening symptoms then ED.

## 2014-02-01 NOTE — Progress Notes (Signed)
Subjective:    Patient ID: Aaron Frost, male    DOB: 1980-01-31, 34 y.o.   MRN: [id number]  HPI   Pt in for some abdominal pain. He has achy pain after he eats and he burps a lot. Feels little bloated. Pt had this pain like this a year ago. But it went away. Pt lost about 60 pounds and has been purpseful. Pt has some nauseu. No vomiting. No dark black or blood stools. Pt has hx of heart burn. Worse when he was heavier.    Pt most recent symptoms about 10 days. Pt admits he has been eating poorly. Dating a girl and eating out a lot.  Pt tried some enema 2 times and started to eat healthy then for a couple of days some loose stools but no definit diarrhea.  Past Medical History  Diagnosis Date  . GERD (gastroesophageal reflux disease)   . Hypertension     History   Social History  . Marital Status: Married    Spouse Name: N/A    Number of Children: N/A  . Years of Education: N/A   Occupational History  . customer service    Social History Main Topics  . Smoking status: Former Smoker -- 1.50 packs/day for 3 years    Types: Cigarettes    Quit date: 01/09/2004  . Smokeless tobacco: Never Used  . Alcohol Use: Yes     Comment: 2 x month---glass of wine occasionally  . Drug Use: No  . Sexual Activity: Not on file   Other Topics Concern  . Not on file   Social History Narrative    Past Surgical History  Procedure Laterality Date  . Tonsillectomy  2005  . External ear surgery      right ear reconstruction    Family History  Problem Relation Age of Onset  . Breast cancer Neg Hx   . Prostate cancer Neg Hx   . Colon cancer Paternal Grandfather   . Heart disease Father   . Hypertension Mother     grandfather/uncles  . Diabetes Maternal Grandfather     Allergies  Allergen Reactions  . Aleve [Naproxen]     Burning sensation and headaches    Current Outpatient Prescriptions on File Prior to Visit  Medication Sig Dispense Refill  . diphenhydrAMINE  (BENADRYL) 25 MG tablet Take 25 mg by mouth every 6 (six) hours as needed.    Marland Kitchen ibuprofen (ADVIL,MOTRIN) 200 MG tablet Take 200 mg by mouth every 6 (six) hours as needed.    . magnesium hydroxide (MILK OF MAGNESIA) 400 MG/5ML suspension Take by mouth daily as needed for mild constipation.    Marland Kitchen omeprazole (PRILOSEC OTC) 20 MG tablet Take 20 mg by mouth daily.    . [DISCONTINUED] hydrochlorothiazide 25 MG tablet Take 1 tablet (25 mg total) by mouth daily. 30 tablet 11   No current facility-administered medications on file prior to visit.    BP 153/97 mmHg  Pulse 78  Temp(Src) 97.5 F (36.4 C) (Oral)  Ht 6\' 2"  (1.88 m)  Wt 368 lb (166.924 kg)  BMI 47.23 kg/m2  SpO2 95%        Review of Systems  Constitutional: Negative for fever, chills and fatigue.  Respiratory: Negative for cough, choking, shortness of breath and wheezing.   Cardiovascular: Negative for chest pain and palpitations.  Gastrointestinal: Positive for nausea, abdominal pain and abdominal distention. Negative for vomiting, diarrhea, constipation, blood in stool and rectal pain.  Musculoskeletal: Negative for  back pain.  Neurological: Negative for tremors and headaches.  Hematological: Negative for adenopathy. Does not bruise/bleed easily.       Objective:   Physical Exam  General Appearance- Not in acute distress.  HEENT Eyes- Scleraeral/Conjuntiva-bilat- Not Yellow. Mouth & Throat- Normal.  Chest and Lung Exam Auscultation: Breath sounds:-Normal. Adventitious sounds:- No Adventitious sounds.  Cardiovascular Auscultation:Rythm - Regular. Heart Sounds -Normal heart sounds.  Abdomen Inspection:-Inspection Normal.  Palpation/Perucssion: Palpation(epigasric faint tender down to umblicus(possilbe bulge just above the umbilicus and midline between retctus abdominus but hard to palpate since he is obese) and Percussion of the abdomen reveal- Non Tender, No Rebound tenderness, No rigidity(Guarding) and No  Palpable abdominal masses.  Liver:-Normal.  Spleen:- Normal.    Back- no cva tenderness.         Assessment & Plan:

## 2014-02-01 NOTE — Assessment & Plan Note (Signed)
I want to get labs and abdominal ultrasound for your abdominal pain. You have some features of reflux but may have other diagnosis.   Please eat healthy and consider zantac otc.  Eat normal healthy diet. Include fruits and vegetables. If loose stool occur then get stool culture.  Follow up in 7 days or as needed.  If at any point severe abdomen pain or worsening symptoms then ED.

## 2014-02-01 NOTE — Progress Notes (Signed)
Pre visit review using our clinic review tool, if applicable. No additional management support is needed unless otherwise documented below in the visit note. 

## 2014-02-02 ENCOUNTER — Telehealth: Payer: Self-pay | Admitting: Physician Assistant

## 2014-02-02 LAB — CBC WITH DIFFERENTIAL/PLATELET
BASOS ABS: 0.1 10*3/uL (ref 0.0–0.1)
BASOS PCT: 1 % (ref 0.0–3.0)
EOS ABS: 0.3 10*3/uL (ref 0.0–0.7)
EOS PCT: 3.4 % (ref 0.0–5.0)
HCT: 47.6 % (ref 39.0–52.0)
Hemoglobin: 16.1 g/dL (ref 13.0–17.0)
Lymphocytes Relative: 16.5 % (ref 12.0–46.0)
Lymphs Abs: 1.6 10*3/uL (ref 0.7–4.0)
MCHC: 33.9 g/dL (ref 30.0–36.0)
MCV: 82.5 fl (ref 78.0–100.0)
Monocytes Absolute: 0.8 10*3/uL (ref 0.1–1.0)
Monocytes Relative: 8.7 % (ref 3.0–12.0)
NEUTROS ABS: 6.7 10*3/uL (ref 1.4–7.7)
NEUTROS PCT: 70.4 % (ref 43.0–77.0)
PLATELETS: 261 10*3/uL (ref 150.0–400.0)
RBC: 5.77 Mil/uL (ref 4.22–5.81)
RDW: 14.2 % (ref 11.5–15.5)
WBC: 9.5 10*3/uL (ref 4.0–10.5)

## 2014-02-02 LAB — COMPREHENSIVE METABOLIC PANEL
ALT: 27 U/L (ref 0–53)
AST: 20 U/L (ref 0–37)
Albumin: 4.1 g/dL (ref 3.5–5.2)
Alkaline Phosphatase: 63 U/L (ref 39–117)
BILIRUBIN TOTAL: 0.4 mg/dL (ref 0.2–1.2)
BUN: 11 mg/dL (ref 6–23)
CO2: 23 meq/L (ref 19–32)
CREATININE: 0.88 mg/dL (ref 0.40–1.50)
Calcium: 9.3 mg/dL (ref 8.4–10.5)
Chloride: 107 mEq/L (ref 96–112)
GFR: 104.98 mL/min (ref >60.00)
Glucose, Bld: 84 mg/dL (ref 70–99)
POTASSIUM: 4.2 meq/L (ref 3.5–5.1)
Sodium: 140 mEq/L (ref 135–145)
Total Protein: 7.1 g/dL (ref 6.0–8.3)

## 2014-02-02 LAB — H. PYLORI ANTIBODY, IGG: H Pylori IgG: NEGATIVE

## 2014-02-02 NOTE — Telephone Encounter (Signed)
He is scheduled for a complete abd ultrasound to look for a hernia.  Do you just want to look for the hernia?  If so that can be put in as a limited if you dont want to see the internal organs

## 2014-02-02 NOTE — Telephone Encounter (Signed)
This was handled already. Santiago Glad asked me before I left.

## 2014-02-03 ENCOUNTER — Ambulatory Visit (HOSPITAL_BASED_OUTPATIENT_CLINIC_OR_DEPARTMENT_OTHER)
Admission: RE | Admit: 2014-02-03 | Discharge: 2014-02-03 | Disposition: A | Discharge status: Home / Self Care | Payer: BLUE CROSS/BLUE SHIELD | Source: Ambulatory Visit | Attending: Medical | Admitting: Medical

## 2014-02-03 ENCOUNTER — Telehealth: Payer: Self-pay | Admitting: Medical

## 2014-02-03 DIAGNOSIS — R1012 Left upper quadrant pain: Secondary | ICD-10-CM | POA: Insufficient documentation

## 2014-02-03 DIAGNOSIS — R1084 Generalized abdominal pain: Secondary | ICD-10-CM

## 2014-02-03 DIAGNOSIS — R161 Splenomegaly, not elsewhere classified: Secondary | ICD-10-CM | POA: Insufficient documentation

## 2014-02-03 DIAGNOSIS — R1033 Periumbilical pain: Secondary | ICD-10-CM

## 2014-02-03 DIAGNOSIS — K439 Ventral hernia without obstruction or gangrene: Secondary | ICD-10-CM

## 2014-02-03 DIAGNOSIS — R1906 Epigastric swelling, mass or lump: Secondary | ICD-10-CM | POA: Insufficient documentation

## 2014-02-03 DIAGNOSIS — K760 Fatty (change of) liver, not elsewhere classified: Secondary | ICD-10-CM | POA: Diagnosis not present

## 2014-02-03 NOTE — Telephone Encounter (Signed)
Will order ct of abdomen based on [place] report by radiologist. Appearance pt has umbilical wall defect with protrusion of omentum. Appears radiologist not sure about bowel.

## 2014-02-04 LAB — LIPASE: LIPASE: 27 U/L (ref 11.0–59.0)

## 2014-02-05 ENCOUNTER — Encounter (HOSPITAL_BASED_OUTPATIENT_CLINIC_OR_DEPARTMENT_OTHER): Payer: Self-pay

## 2014-02-05 ENCOUNTER — Ambulatory Visit (HOSPITAL_BASED_OUTPATIENT_CLINIC_OR_DEPARTMENT_OTHER)
Admission: RE | Admit: 2014-02-05 | Discharge: 2014-02-05 | Disposition: A | Discharge status: Home / Self Care | Payer: BLUE CROSS/BLUE SHIELD | Source: Ambulatory Visit | Attending: Medical | Admitting: Medical

## 2014-02-05 ENCOUNTER — Other Ambulatory Visit (HOSPITAL_BASED_OUTPATIENT_CLINIC_OR_DEPARTMENT_OTHER): Payer: BLUE CROSS/BLUE SHIELD

## 2014-02-05 ENCOUNTER — Telehealth: Payer: Self-pay | Admitting: Medical

## 2014-02-05 DIAGNOSIS — R1033 Periumbilical pain: Secondary | ICD-10-CM | POA: Diagnosis not present

## 2014-02-05 DIAGNOSIS — K439 Ventral hernia without obstruction or gangrene: Secondary | ICD-10-CM

## 2014-02-05 MED ORDER — IOHEXOL 300 MG/ML  SOLN
100.0000 mL | Freq: Once | INTRAMUSCULAR | Status: AC | PRN
  Administered 2014-02-05: 100 mL via INTRAVENOUS

## 2014-02-05 NOTE — Telephone Encounter (Signed)
Referal to general surgeon for ventral hernia.

## 2014-02-08 ENCOUNTER — Telehealth: Payer: Self-pay | Admitting: Physician Assistant

## 2014-02-08 ENCOUNTER — Other Ambulatory Visit: Payer: Self-pay

## 2014-02-08 NOTE — Telephone Encounter (Signed)
Patient advised/informed regarding referral to General surgery.

## 2014-02-08 NOTE — Telephone Encounter (Signed)
Caller name:Ecangelista, Iann Relation to SJ:GGEZ Call back number:[phone] Pharmacy:  Reason for call: pt saw Percell Miller on 1/25 states he never got results from his ct scan that was done. Pt still has some pain wants to know what's going on

## 2014-02-10 NOTE — Telephone Encounter (Signed)
Called patient back on this day. Given results of CT and advised he will be referred to General Surgeon.

## 2014-04-09 ENCOUNTER — Encounter: Payer: Self-pay | Admitting: Physician Assistant

## 2014-04-09 ENCOUNTER — Ambulatory Visit (INDEPENDENT_AMBULATORY_CARE_PROVIDER_SITE_OTHER): Payer: BLUE CROSS/BLUE SHIELD | Admitting: Physician Assistant

## 2014-04-09 VITALS — BP 110/80 | HR 71 | Temp 97.8°F | Ht 73.0 in | Wt 360.4 lb

## 2014-04-09 DIAGNOSIS — D224 Melanocytic nevi of scalp and neck: Secondary | ICD-10-CM

## 2014-04-09 NOTE — Progress Notes (Signed)
Pre visit review using our clinic review tool, if applicable. No additional management support is needed unless otherwise documented below in the visit note. 

## 2014-04-09 NOTE — Assessment & Plan Note (Signed)
Do think this warrants further evaluation by dermatology. Referral placed.

## 2014-04-09 NOTE — Progress Notes (Signed)
 Patient presents to clinic today for assessment of a concerning skin lesion on his posterior neck. Patient states this "mole" been present for several years, but recently he has noticed it has been getting bigger. Significant other has noticed the color of the lesion has changed. Patient has had concerning lesions removed previously by dermatology. Denies history of skin cancer. Denies excess sun exposure. Has fair skin.  Past Medical History  Diagnosis Date  . GERD (gastroesophageal reflux disease)   . Hypertension     Current Outpatient Prescriptions on File Prior to Visit  Medication Sig Dispense Refill  . diphenhydrAMINE (BENADRYL) 25 MG tablet Take 25 mg by mouth every 6 (six) hours as needed.    . ibuprofen (ADVIL,MOTRIN) 200 MG tablet Take 200 mg by mouth every 6 (six) hours as needed.    . magnesium hydroxide (MILK OF MAGNESIA) 400 MG/5ML suspension Take by mouth daily as needed for mild constipation.    . omeprazole (PRILOSEC OTC) 20 MG tablet Take 20 mg by mouth daily.    . [DISCONTINUED] hydrochlorothiazide 25 MG tablet Take 1 tablet (25 mg total) by mouth daily. 30 tablet 11   No current facility-administered medications on file prior to visit.    Allergies  Allergen Reactions  . Aleve [Naproxen]     Burning sensation and headaches    Family History  Problem Relation Age of Onset  . Breast cancer Neg Hx   . Prostate cancer Neg Hx   . Colon cancer Paternal Grandfather   . Heart disease Father   . Hypertension Mother     grandfather/uncles  . Diabetes Maternal Grandfather     History   Social History  . Marital Status: Legally Separated    Spouse Name: N/A  . Number of Children: N/A  . Years of Education: N/A   Occupational History  . customer service    Social History Main Topics  . Smoking status: Former Smoker -- 1.50 packs/day for 3 years    Types: Cigarettes    Quit date: 01/09/2004  . Smokeless tobacco: Never Used  . Alcohol Use: Yes   Comment: 2 x month---glass of wine occasionally  . Drug Use: No  . Sexual Activity: Not on file   Other Topics Concern  . Not on file   Social History Narrative    Review of Systems - See HPI.  All other ROS are negative.  There were no vitals taken for this visit.  Physical Exam  Constitutional: He is oriented to person, place, and time and well-developed, well-nourished, and in no distress.  HENT:  Head: Normocephalic and atraumatic.  Cardiovascular: Normal rate and regular rhythm.   Pulmonary/Chest: Effort normal.  Neurological: He is alert and oriented to person, place, and time.  Skin: Skin is warm and dry.     Vitals reviewed.   Recent Results (from the past 2160 hour(s))  CBC w/Diff     Status: None   Collection Time: 02/01/14  3:06 PM  Result Value Ref Range   WBC 9.5 4.0 - 10.5 K/uL   RBC 5.77 4.22 - 5.81 Mil/uL   Hemoglobin 16.1 13.0 - 17.0 g/dL   HCT 47.6 39.0 - 52.0 %   MCV 82.5 78.0 - 100.0 fl   MCHC 33.9 30.0 - 36.0 g/dL   RDW 14.2 11.5 - 15.5 %   Platelets 261.0 150.0 - 400.0 K/uL   Neutrophils Relative % 70.4 43.0 - 77.0 %   Lymphocytes Relative 16.5 12.0 - 46.0 %     Monocytes Relative 8.7 3.0 - 12.0 %   Eosinophils Relative 3.4 0.0 - 5.0 %   Basophils Relative 1.0 0.0 - 3.0 %   Neutro Abs 6.7 1.4 - 7.7 K/uL   Lymphs Abs 1.6 0.7 - 4.0 K/uL   Monocytes Absolute 0.8 0.1 - 1.0 K/uL   Eosinophils Absolute 0.3 0.0 - 0.7 K/uL   Basophils Absolute 0.1 0.0 - 0.1 K/uL  Comp Met (CMET)     Status: None   Collection Time: 02/01/14  3:06 PM  Result Value Ref Range   Sodium 140 135 - 145 mEq/L   Potassium 4.2 3.5 - 5.1 mEq/L   Chloride 107 96 - 112 mEq/L   CO2 23 19 - 32 mEq/L   Glucose, Bld 84 70 - 99 mg/dL   BUN 11 6 - 23 mg/dL   Creatinine, Ser 0.88 0.40 - 1.50 mg/dL   Total Bilirubin 0.4 0.2 - 1.2 mg/dL   Alkaline Phosphatase 63 39 - 117 U/L   AST 20 0 - 37 U/L   ALT 27 0 - 53 U/L   Total Protein 7.1 6.0 - 8.3 g/dL   Albumin 4.1 3.5 - 5.2 g/dL    Calcium 9.3 8.4 - 10.5 mg/dL   GFR 104.98 >60.00 mL/min  Lipase     Status: None   Collection Time: 02/01/14  3:06 PM  Result Value Ref Range   Lipase 27.0 11.0 - 59.0 U/L  H. pylori antibody, IgG     Status: None   Collection Time: 02/01/14  3:06 PM  Result Value Ref Range   H Pylori IgG Negative Negative    Assessment/Plan: No problem-specific assessment & plan notes found for this encounter.

## 2014-04-09 NOTE — Patient Instructions (Signed)
You will be contacted by dermatology for further assessment and potential biopsy. Please  schedule a physical  At your earliest convenience.

## 2014-04-13 ENCOUNTER — Telehealth: Payer: Self-pay | Admitting: *Deleted

## 2014-04-13 NOTE — Telephone Encounter (Signed)
Pre visit planning completed under snapshot in specialty comments.

## 2014-04-13 NOTE — Telephone Encounter (Signed)
LMOVM

## 2014-04-14 ENCOUNTER — Encounter: Payer: BLUE CROSS/BLUE SHIELD | Admitting: Physician Assistant

## 2014-04-14 ENCOUNTER — Telehealth: Payer: Self-pay | Admitting: Physician Assistant

## 2014-04-14 NOTE — Telephone Encounter (Signed)
Unable to come to appointment today due to being called into work.

## 2014-04-19 NOTE — Telephone Encounter (Signed)
No charge. 

## 2014-04-19 NOTE — Telephone Encounter (Signed)
Pt rescheduled for 04/23/14- no letter sent- charge ?

## 2014-04-22 ENCOUNTER — Telehealth: Payer: Self-pay | Admitting: *Deleted

## 2014-04-22 NOTE — Telephone Encounter (Signed)
Unable to reach patient at time of Pre-Visit Call.  Left message for patient to return call when available.    

## 2014-04-23 ENCOUNTER — Ambulatory Visit (INDEPENDENT_AMBULATORY_CARE_PROVIDER_SITE_OTHER): Payer: BLUE CROSS/BLUE SHIELD | Admitting: Physician Assistant

## 2014-04-23 ENCOUNTER — Encounter: Payer: Self-pay | Admitting: Physician Assistant

## 2014-04-23 VITALS — BP 132/78 | HR 63 | Temp 97.9°F | Resp 16 | Ht 73.0 in | Wt 355.2 lb

## 2014-04-23 DIAGNOSIS — Z0000 Encounter for general adult medical examination without abnormal findings: Secondary | ICD-10-CM | POA: Diagnosis not present

## 2014-04-23 LAB — LIPID PANEL
CHOLESTEROL: 163 mg/dL (ref 0–200)
HDL: 39.1 mg/dL (ref >39.00)
LDL Cholesterol: 107 mg/dL — ABNORMAL HIGH (ref 0–99)
NonHDL: 123.9
Total CHOL/HDL Ratio: 4
Triglycerides: 84 mg/dL (ref 0.0–149.0)
VLDL: 16.8 mg/dL (ref 0.0–40.0)

## 2014-04-23 LAB — COMPREHENSIVE METABOLIC PANEL
ALBUMIN: 3.9 g/dL (ref 3.5–5.2)
ALT: 22 U/L (ref 0–53)
AST: 21 U/L (ref 0–37)
Alkaline Phosphatase: 60 U/L (ref 39–117)
BUN: 12 mg/dL (ref 6–23)
CALCIUM: 9.1 mg/dL (ref 8.4–10.5)
CHLORIDE: 103 meq/L (ref 96–112)
CO2: 29 mEq/L (ref 19–32)
Creatinine, Ser: 0.84 mg/dL (ref 0.40–1.50)
GFR: 110.63 mL/min (ref >60.00)
GLUCOSE: 96 mg/dL (ref 70–99)
POTASSIUM: 4 meq/L (ref 3.5–5.1)
Sodium: 137 mEq/L (ref 135–145)
Total Bilirubin: 0.6 mg/dL (ref 0.2–1.2)
Total Protein: 6.8 g/dL (ref 6.0–8.3)

## 2014-04-23 LAB — CBC
HCT: 47.1 % (ref 39.0–52.0)
Hemoglobin: 16.1 g/dL (ref 13.0–17.0)
MCHC: 34.3 g/dL (ref 30.0–36.0)
MCV: 82.7 fl (ref 78.0–100.0)
PLATELETS: 240 10*3/uL (ref 150.0–400.0)
RBC: 5.7 Mil/uL (ref 4.22–5.81)
RDW: 13.9 % (ref 11.5–15.5)
WBC: 8.2 10*3/uL (ref 4.0–10.5)

## 2014-04-23 LAB — URINALYSIS, ROUTINE W REFLEX MICROSCOPIC
KETONES UR: NEGATIVE
LEUKOCYTES UA: NEGATIVE
Nitrite: NEGATIVE
Specific Gravity, Urine: 1.025 (ref 1.000–1.030)
Total Protein, Urine: NEGATIVE
URINE GLUCOSE: NEGATIVE
UROBILINOGEN UA: 0.2 (ref 0.0–1.0)
pH: 6 (ref 5.0–8.0)

## 2014-04-23 LAB — HEMOGLOBIN A1C: HEMOGLOBIN A1C: 5.3 % (ref 4.6–6.5)

## 2014-04-23 LAB — TSH: TSH: 2.32 u[IU]/mL (ref 0.35–4.50)

## 2014-04-23 NOTE — Assessment & Plan Note (Signed)
Dietary and exercise measures discussed. Goal of 150  minutes of cardiovascular exercise a week. handout given. Will monitor weight and BMI subsequent visits.

## 2014-04-23 NOTE — Progress Notes (Signed)
Pre visit review using our clinic review tool, if applicable. No additional management support is needed unless otherwise documented below in the visit note/SLS  

## 2014-04-23 NOTE — Progress Notes (Signed)
Patient presents to clinic today for annual exam.  Patient is fasting for labs.  Acute Concerns: No acute concerns at today's visit.  Chronic Issues: Morbid Obesity -- Body mass index is 46.88 kg/(m^2).Marland Kitchen  Patient is trying to Calories and watch what he eats. Has previously been very sedentary but is trying to increase his aerobic activity.   Health Maintenance: Dental -- due Vision -- up-to-date Immunizations -- up-to-date  Past Medical History  Diagnosis Date  . GERD (gastroesophageal reflux disease)   . Hypertension   . Nickel allergy     Past Surgical History  Procedure Laterality Date  . Tonsillectomy  2005  . External ear surgery      right ear reconstruction  . Mole removal      Baco of Neck    Current Outpatient Prescriptions on File Prior to Visit  Medication Sig Dispense Refill  . ibuprofen (ADVIL,MOTRIN) 200 MG tablet Take 200 mg by mouth every 6 (six) hours as needed.    . loratadine (CLARITIN) 10 MG tablet Take 10 mg by mouth daily.    . magnesium hydroxide (MILK OF MAGNESIA) 400 MG/5ML suspension Take by mouth daily as needed for mild constipation.    Marland Kitchen omeprazole (PRILOSEC OTC) 20 MG tablet Take 20 mg by mouth daily.    . [DISCONTINUED] hydrochlorothiazide 25 MG tablet Take 1 tablet (25 mg total) by mouth daily. 30 tablet 11   No current facility-administered medications on file prior to visit.    Allergies  Allergen Reactions  . Aleve [Naproxen]     Burning sensation and headaches  . Nickel Rash    Family History  Problem Relation Age of Onset  . Breast cancer Neg Hx   . Prostate cancer Neg Hx   . Colon cancer Paternal Grandfather   . Heart disease Father   . Hypertension Mother     grandfather/uncles  . Diabetes Maternal Grandfather     History   Social History  . Marital Status: Legally Separated    Spouse Name: N/A  . Number of Children: N/A  . Years of Education: N/A   Occupational History  . customer service    Social  History Main Topics  . Smoking status: Former Smoker -- 1.50 packs/day for 3 years    Types: Cigarettes    Quit date: 01/09/2004  . Smokeless tobacco: Never Used  . Alcohol Use: Yes     Comment: 2 x month---glass of wine occasionally  . Drug Use: No  . Sexual Activity: Not on file   Other Topics Concern  . Not on file   Social History Narrative   Review of Systems  Constitutional: Negative for fever and weight loss.  HENT: Negative for ear discharge, ear pain, hearing loss and tinnitus.   Eyes: Negative for blurred vision, double vision, photophobia and pain.  Respiratory: Negative for cough and shortness of breath.   Cardiovascular: Negative for chest pain and palpitations.  Gastrointestinal: Negative for heartburn, nausea, vomiting, abdominal pain, diarrhea, constipation, blood in stool and melena.  Genitourinary: Negative for dysuria, urgency, frequency, hematuria and flank pain.  Musculoskeletal: Negative for falls.  Neurological: Negative for dizziness, loss of consciousness and headaches.  Endo/Heme/Allergies: Negative for environmental allergies.  Psychiatric/Behavioral: Negative for depression, suicidal ideas, hallucinations and substance abuse. The patient is not nervous/anxious and does not have insomnia.    BP 132/78 mmHg  Pulse 63  Temp(Src) 97.9 F (36.6 C) (Oral)  Resp 16  Ht 6' 1" (1.854 m)  Wt 355 lb 4 oz (161.14 kg)  BMI 46.88 kg/m2  SpO2 99%  Physical Exam  Constitutional: He is oriented to person, place, and time and well-developed, well-nourished, and in no distress.  HENT:  Head: Normocephalic and atraumatic.  Right Ear: External ear normal.  Left Ear: External ear normal.  Nose: Nose normal.  Mouth/Throat: Oropharynx is clear and moist. No oropharyngeal exudate.  Eyes: Conjunctivae and EOM are normal. Pupils are equal, round, and reactive to light.  Neck: Neck supple. No thyromegaly present.  Cardiovascular: Normal rate, regular rhythm, normal  heart sounds and intact distal pulses.   Pulmonary/Chest: Effort normal and breath sounds normal. No respiratory distress. He has no wheezes. He has no rales. He exhibits no tenderness.  Abdominal: Soft. Bowel sounds are normal. He exhibits no distension and no mass. There is no tenderness. There is no rebound and no guarding.  Genitourinary: Testes/scrotum normal and penis normal. No discharge found.  Lymphadenopathy:    He has no cervical adenopathy.  Neurological: He is alert and oriented to person, place, and time.  Skin: Skin is warm and dry. No rash noted.  Psychiatric: Affect normal.  Vitals reviewed.   Recent Results (from the past 2160 hour(s))  CBC w/Diff     Status: None   Collection Time: 02/01/14  3:06 PM  Result Value Ref Range   WBC 9.5 4.0 - 10.5 K/uL   RBC 5.77 4.22 - 5.81 Mil/uL   Hemoglobin 16.1 13.0 - 17.0 g/dL   HCT 47.6 39.0 - 52.0 %   MCV 82.5 78.0 - 100.0 fl   MCHC 33.9 30.0 - 36.0 g/dL   RDW 14.2 11.5 - 15.5 %   Platelets 261.0 150.0 - 400.0 K/uL   Neutrophils Relative % 70.4 43.0 - 77.0 %   Lymphocytes Relative 16.5 12.0 - 46.0 %   Monocytes Relative 8.7 3.0 - 12.0 %   Eosinophils Relative 3.4 0.0 - 5.0 %   Basophils Relative 1.0 0.0 - 3.0 %   Neutro Abs 6.7 1.4 - 7.7 K/uL   Lymphs Abs 1.6 0.7 - 4.0 K/uL   Monocytes Absolute 0.8 0.1 - 1.0 K/uL   Eosinophils Absolute 0.3 0.0 - 0.7 K/uL   Basophils Absolute 0.1 0.0 - 0.1 K/uL  Comp Met (CMET)     Status: None   Collection Time: 02/01/14  3:06 PM  Result Value Ref Range   Sodium 140 135 - 145 mEq/L   Potassium 4.2 3.5 - 5.1 mEq/L   Chloride 107 96 - 112 mEq/L   CO2 23 19 - 32 mEq/L   Glucose, Bld 84 70 - 99 mg/dL   BUN 11 6 - 23 mg/dL   Creatinine, Ser 0.88 0.40 - 1.50 mg/dL   Total Bilirubin 0.4 0.2 - 1.2 mg/dL   Alkaline Phosphatase 63 39 - 117 U/L   AST 20 0 - 37 U/L   ALT 27 0 - 53 U/L   Total Protein 7.1 6.0 - 8.3 g/dL   Albumin 4.1 3.5 - 5.2 g/dL   Calcium 9.3 8.4 - 10.5 mg/dL   GFR  104.98 >60.00 mL/min  Lipase     Status: None   Collection Time: 02/01/14  3:06 PM  Result Value Ref Range   Lipase 27.0 11.0 - 59.0 U/L  H. pylori antibody, IgG     Status: None   Collection Time: 02/01/14  3:06 PM  Result Value Ref Range   H Pylori IgG Negative Negative    Assessment/Plan: Visit for  preventive health examination Health maintenance topics discussed. Patient due for repeat digital examination. Encouraged patient to schedule. Depression screen negative. Immunizations are up-to-date. Will obtain fasting labs at today's visit.   Morbid obesity Dietary and exercise measures discussed. Goal of 150  minutes of cardiovascular exercise a week. handout given. Will monitor weight and BMI subsequent visits.

## 2014-04-23 NOTE — Patient Instructions (Signed)
Please go to the lab for blood work. I will call you with your results.  Preventive Care for Adults A healthy lifestyle and preventive care can promote health and wellness. Preventive health guidelines for men include the following key practices:  A routine yearly physical is a good way to check with your health care provider about your health and preventative screening. It is a chance to share any concerns and updates on your health and to receive a thorough exam.  Visit your dentist for a routine exam and preventative care every 6 months. Brush your teeth twice a day and floss once a day. Good oral hygiene prevents tooth decay and gum disease.  The frequency of eye exams is based on your age, health, family medical history, use of contact lenses, and other factors. Follow your health care provider's recommendations for frequency of eye exams.  Eat a healthy diet. Foods such as vegetables, fruits, whole grains, low-fat dairy products, and lean protein foods contain the nutrients you need without too many calories. Decrease your intake of foods high in solid fats, added sugars, and salt. Eat the right amount of calories for you.Get information about a proper diet from your health care provider, if necessary.  Regular physical exercise is one of the most important things you can do for your health. Most adults should get at least 150 minutes of moderate-intensity exercise (any activity that increases your heart rate and causes you to sweat) each week. In addition, most adults need muscle-strengthening exercises on 2 or more days a week.  Maintain a healthy weight. The body mass index (BMI) is a screening tool to identify possible weight problems. It provides an estimate of body fat based on height and weight. Your health care provider can find your BMI and can help you achieve or maintain a healthy weight.For adults 20 years and older:  A BMI below 18.5 is considered underweight.  A BMI of 18.5  to 24.9 is normal.  A BMI of 25 to 29.9 is considered overweight.  A BMI of 30 and above is considered obese.  Maintain normal blood lipids and cholesterol levels by exercising and minimizing your intake of saturated fat. Eat a balanced diet with plenty of fruit and vegetables. Blood tests for lipids and cholesterol should begin at age 36 and be repeated every 5 years. If your lipid or cholesterol levels are high, you are over 50, or you are at high risk for heart disease, you may need your cholesterol levels checked more frequently.Ongoing high lipid and cholesterol levels should be treated with medicines if diet and exercise are not working.  If you smoke, find out from your health care provider how to quit. If you do not use tobacco, do not start.  Lung cancer screening is recommended for adults aged 62-80 years who are at high risk for developing lung cancer because of a history of smoking. A yearly low-dose CT scan of the lungs is recommended for people who have at least a 30-pack-year history of smoking and are a current smoker or have quit within the past 15 years. A pack year of smoking is smoking an average of 1 pack of cigarettes a day for 1 year (for example: 1 pack a day for 30 years or 2 packs a day for 15 years). Yearly screening should continue until the smoker has stopped smoking for at least 15 years. Yearly screening should be stopped for people who develop a health problem that would prevent them from  having lung cancer treatment.  If you choose to drink alcohol, do not have more than 2 drinks per day. One drink is considered to be 12 ounces (355 mL) of beer, 5 ounces (148 mL) of wine, or 1.5 ounces (44 mL) of liquor.  Avoid use of street drugs. Do not share needles with anyone. Ask for help if you need support or instructions about stopping the use of drugs.  High blood pressure causes heart disease and increases the risk of stroke. Your blood pressure should be checked at least  every 1-2 years. Ongoing high blood pressure should be treated with medicines, if weight loss and exercise are not effective.  If you are 94-36 years old, ask your health care provider if you should take aspirin to prevent heart disease.  Diabetes screening involves taking a blood sample to check your fasting blood sugar level. This should be done once every 3 years, after age 37, if you are within normal weight and without risk factors for diabetes. Testing should be considered at a younger age or be carried out more frequently if you are overweight and have at least 1 risk factor for diabetes.  Colorectal cancer can be detected and often prevented. Most routine colorectal cancer screening begins at the age of 36 and continues through age 52. However, your health care provider may recommend screening at an earlier age if you have risk factors for colon cancer. On a yearly basis, your health care provider may provide home test kits to check for hidden blood in the stool. Use of a small camera at the end of a tube to directly examine the colon (sigmoidoscopy or colonoscopy) can detect the earliest forms of colorectal cancer. Talk to your health care provider about this at age 58, when routine screening begins. Direct exam of the colon should be repeated every 5-10 years through age 2, unless early forms of precancerous polyps or small growths are found.  People who are at an increased risk for hepatitis B should be screened for this virus. You are considered at high risk for hepatitis B if:  You were born in a country where hepatitis B occurs often. Talk with your health care provider about which countries are considered high risk.  Your parents were born in a high-risk country and you have not received a shot to protect against hepatitis B (hepatitis B vaccine).  You have HIV or AIDS.  You use needles to inject street drugs.  You live with, or have sex with, someone who has hepatitis B.  You are  a man who has sex with other men (MSM).  You get hemodialysis treatment.  You take certain medicines for conditions such as cancer, organ transplantation, and autoimmune conditions.  Hepatitis C blood testing is recommended for all people born from [birth year] through 1965 and any individual with known risks for hepatitis C.  Practice safe sex. Use condoms and avoid high-risk sexual practices to reduce the spread of sexually transmitted infections (STIs). STIs include gonorrhea, chlamydia, syphilis, trichomonas, herpes, HPV, and human immunodeficiency virus (HIV). Herpes, HIV, and HPV are viral illnesses that have no cure. They can result in disability, cancer, and death.  If you are at risk of being infected with HIV, it is recommended that you take a prescription medicine daily to prevent HIV infection. This is called preexposure prophylaxis (PrEP). You are considered at risk if:  You are a man who has sex with other men (MSM) and have other risk factors.  You are a heterosexual man, are sexually active, and are at increased risk for HIV infection.  You take drugs by injection.  You are sexually active with a partner who has HIV.  Talk with your health care provider about whether you are at high risk of being infected with HIV. If you choose to begin PrEP, you should first be tested for HIV. You should then be tested every 3 months for as long as you are taking PrEP.  A one-time screening for abdominal aortic aneurysm (AAA) and surgical repair of large AAAs by ultrasound are recommended for men ages 49 to 68 years who are current or former smokers.  Healthy men should no longer receive prostate-specific antigen (PSA) blood tests as part of routine cancer screening. Talk with your health care provider about prostate cancer screening.  Testicular cancer screening is not recommended for adult males who have no symptoms. Screening includes self-exam, a health care provider exam, and other screening  tests. Consult with your health care provider about any symptoms you have or any concerns you have about testicular cancer.  Use sunscreen. Apply sunscreen liberally and repeatedly throughout the day. You should seek shade when your shadow is shorter than you. Protect yourself by wearing long sleeves, pants, a wide-brimmed hat, and sunglasses year round, whenever you are outdoors.  Once a month, do a whole-body skin exam, using a mirror to look at the skin on your back. Tell your health care provider about new moles, moles that have irregular borders, moles that are larger than a pencil eraser, or moles that have changed in shape or color.  Stay current with required vaccines (immunizations).  Influenza vaccine. All adults should be immunized every year.  Tetanus, diphtheria, and acellular pertussis (Td, Tdap) vaccine. An adult who has not previously received Tdap or who does not know his vaccine status should receive 1 dose of Tdap. This initial dose should be followed by tetanus and diphtheria toxoids (Td) booster doses every 10 years. Adults with an unknown or incomplete history of completing a 3-dose immunization series with Td-containing vaccines should begin or complete a primary immunization series including a Tdap dose. Adults should receive a Td booster every 10 years.  Varicella vaccine. An adult without evidence of immunity to varicella should receive 2 doses or a second dose if he has previously received 1 dose.  Human papillomavirus (HPV) vaccine. Males aged 58-21 years who have not received the vaccine previously should receive the 3-dose series. Males aged 22-26 years may be immunized. Immunization is recommended through the age of 26 years for any male who has sex with males and did not get any or all doses earlier. Immunization is recommended for any person with an immunocompromised condition through the age of 81 years if he did not get any or all doses earlier. During the 3-dose  series, the second dose should be obtained 4-8 weeks after the first dose. The third dose should be obtained 24 weeks after the first dose and 16 weeks after the second dose.  Zoster vaccine. One dose is recommended for adults aged 53 years or older unless certain conditions are present.  Measles, mumps, and rubella (MMR) vaccine. Adults born before [birth year] generally are considered immune to measles and mumps. Adults born in [birth year] or later should have 1 or more doses of MMR vaccine unless there is a contraindication to the vaccine or there is laboratory evidence of immunity to each of the three diseases. A routine second dose of  MMR vaccine should be obtained at least 28 days after the first dose for students attending postsecondary schools, health care workers, or international travelers. People who received inactivated measles vaccine or an unknown type of measles vaccine during 1963-1967 should receive 2 doses of MMR vaccine. People who received inactivated mumps vaccine or an unknown type of mumps vaccine before 1979 and are at high risk for mumps infection should consider immunization with 2 doses of MMR vaccine. Unvaccinated health care workers born before [birth year] who lack laboratory evidence of measles, mumps, or rubella immunity or laboratory confirmation of disease should consider measles and mumps immunization with 2 doses of MMR vaccine or rubella immunization with 1 dose of MMR vaccine.  Pneumococcal 13-valent conjugate (PCV13) vaccine. When indicated, a person who is uncertain of his immunization history and has no record of immunization should receive the PCV13 vaccine. An adult aged 3 years or older who has certain medical conditions and has not been previously immunized should receive 1 dose of PCV13 vaccine. This PCV13 should be followed with a dose of pneumococcal polysaccharide (PPSV23) vaccine. The PPSV23 vaccine dose should be obtained at least 8 weeks after the dose of PCV13 vaccine. An adult  aged 19 years or older who has certain medical conditions and previously received 1 or more doses of PPSV23 vaccine should receive 1 dose of PCV13. The PCV13 vaccine dose should be obtained 1 or more years after the last PPSV23 vaccine dose.  Pneumococcal polysaccharide (PPSV23) vaccine. When PCV13 is also indicated, PCV13 should be obtained first. All adults aged 13 years and older should be immunized. An adult younger than age 80 years who has certain medical conditions should be immunized. Any person who resides in a nursing home or long-term care facility should be immunized. An adult smoker should be immunized. People with an immunocompromised condition and certain other conditions should receive both PCV13 and PPSV23 vaccines. People with human immunodeficiency virus (HIV) infection should be immunized as soon as possible after diagnosis. Immunization during chemotherapy or radiation therapy should be avoided. Routine use of PPSV23 vaccine is not recommended for American Indians, [place] Natives, or people younger than 65 years unless there are medical conditions that require PPSV23 vaccine. When indicated, people who have unknown immunization and have no record of immunization should receive PPSV23 vaccine. One-time revaccination 5 years after the first dose of PPSV23 is recommended for people aged 19-64 years who have chronic kidney failure, nephrotic syndrome, asplenia, or immunocompromised conditions. People who received 1-2 doses of PPSV23 before age 59 years should receive another dose of PPSV23 vaccine at age 9 years or later if at least 5 years have passed since the previous dose. Doses of PPSV23 are not needed for people immunized with PPSV23 at or after age 74 years.  Meningococcal vaccine. Adults with asplenia or persistent complement component deficiencies should receive 2 doses of quadrivalent meningococcal conjugate (MenACWY-D) vaccine. The doses should be obtained at least 2 months apart.  Microbiologists working with certain meningococcal bacteria, [place] recruits, people at risk during an outbreak, and people who travel to or live in countries with a high rate of meningitis should be immunized. A first-year college student up through age 12 years who is living in a residence hall should receive a dose if he did not receive a dose on or after his 16th birthday. Adults who have certain high-risk conditions should receive one or more doses of vaccine.  Hepatitis A vaccine. Adults who wish to be protected from this  disease, have certain high-risk conditions, work with hepatitis A-infected animals, work in hepatitis A research labs, or travel to or work in countries with a high rate of hepatitis A should be immunized. Adults who were previously unvaccinated and who anticipate close contact with an international adoptee during the first 60 days after arrival in the [place] from a country with a high rate of hepatitis A should be immunized.  Hepatitis B vaccine. Adults should be immunized if they wish to be protected from this disease, have certain high-risk conditions, may be exposed to blood or other infectious body fluids, are household contacts or sex partners of hepatitis B positive people, are clients or workers in certain care facilities, or travel to or work in countries with a high rate of hepatitis B.  Haemophilus influenzae type b (Hib) vaccine. A previously unvaccinated person with asplenia or sickle cell disease or having a scheduled splenectomy should receive 1 dose of Hib vaccine. Regardless of previous immunization, a recipient of a hematopoietic stem cell transplant should receive a 3-dose series 6-12 months after his successful transplant. Hib vaccine is not recommended for adults with HIV infection. Preventive Service / Frequency Ages 33 to 91  Blood pressure check.** / Every 1 to 2 years.  Lipid and cholesterol check.** / Every 5 years beginning at age  22.  Hepatitis C blood test.** / For any individual with known risks for hepatitis C.  Skin self-exam. / Monthly.  Influenza vaccine. / Every year.  Tetanus, diphtheria, and acellular pertussis (Tdap, Td) vaccine.** / Consult your health care provider. 1 dose of Td every 10 years.  Varicella vaccine.** / Consult your health care provider.  HPV vaccine. / 3 doses over 6 months, if 4 or younger.  Measles, mumps, rubella (MMR) vaccine.** / You need at least 1 dose of MMR if you were born in 1957 or later. You may also need a second dose.  Pneumococcal 13-valent conjugate (PCV13) vaccine.** / Consult your health care provider.  Pneumococcal polysaccharide (PPSV23) vaccine.** / 1 to 2 doses if you smoke cigarettes or if you have certain conditions.  Meningococcal vaccine.** / 1 dose if you are age 53 to 60 years and a Market researcher living in a residence hall, or have one of several medical conditions. You may also need additional booster doses.  Hepatitis A vaccine.** / Consult your health care provider.  Hepatitis B vaccine.** / Consult your health care provider.  Haemophilus influenzae type b (Hib) vaccine.** / Consult your health care provider. Ages 79 to 66  Blood pressure check.** / Every 1 to 2 years.  Lipid and cholesterol check.** / Every 5 years beginning at age 36.  Lung cancer screening. / Every year if you are aged 27-80 years and have a 30-pack-year history of smoking and currently smoke or have quit within the past 15 years. Yearly screening is stopped once you have quit smoking for at least 15 years or develop a health problem that would prevent you from having lung cancer treatment.  Fecal occult blood test (FOBT) of stool. / Every year beginning at age 53 and continuing until age 77. You may not have to do this test if you get a colonoscopy every 10 years.  Flexible sigmoidoscopy** or colonoscopy.** / Every 5 years for a flexible sigmoidoscopy or every  10 years for a colonoscopy beginning at age 93 and continuing until age 21.  Hepatitis C blood test.** / For all people born from [birth year] through 1965 and any  individual with known risks for hepatitis C.  Skin self-exam. / Monthly.  Influenza vaccine. / Every year.  Tetanus, diphtheria, and acellular pertussis (Tdap/Td) vaccine.** / Consult your health care provider. 1 dose of Td every 10 years.  Varicella vaccine.** / Consult your health care provider.  Zoster vaccine.** / 1 dose for adults aged 35 years or older.  Measles, mumps, rubella (MMR) vaccine.** / You need at least 1 dose of MMR if you were born in 1957 or later. You may also need a second dose.  Pneumococcal 13-valent conjugate (PCV13) vaccine.** / Consult your health care provider.  Pneumococcal polysaccharide (PPSV23) vaccine.** / 1 to 2 doses if you smoke cigarettes or if you have certain conditions.  Meningococcal vaccine.** / Consult your health care provider.  Hepatitis A vaccine.** / Consult your health care provider.  Hepatitis B vaccine.** / Consult your health care provider.  Haemophilus influenzae type b (Hib) vaccine.** / Consult your health care provider. Ages 73 and over  Blood pressure check.** / Every 1 to 2 years.  Lipid and cholesterol check.**/ Every 5 years beginning at age 51.  Lung cancer screening. / Every year if you are aged 8-80 years and have a 30-pack-year history of smoking and currently smoke or have quit within the past 15 years. Yearly screening is stopped once you have quit smoking for at least 15 years or develop a health problem that would prevent you from having lung cancer treatment.  Fecal occult blood test (FOBT) of stool. / Every year beginning at age 58 and continuing until age 106. You may not have to do this test if you get a colonoscopy every 10 years.  Flexible sigmoidoscopy** or colonoscopy.** / Every 5 years for a flexible sigmoidoscopy or every 10 years for a colonoscopy  beginning at age 70 and continuing until age 44.  Hepatitis C blood test.** / For all people born from [birth year] through 1966 and any individual with known risks for hepatitis C.  Abdominal aortic aneurysm (AAA) screening.** / A one-time screening for ages 62 to 3 years who are current or former smokers.  Skin self-exam. / Monthly.  Influenza vaccine. / Every year.  Tetanus, diphtheria, and acellular pertussis (Tdap/Td) vaccine.** / 1 dose of Td every 10 years.  Varicella vaccine.** / Consult your health care provider.  Zoster vaccine.** / 1 dose for adults aged 56 years or older.  Pneumococcal 13-valent conjugate (PCV13) vaccine.** / Consult your health care provider.  Pneumococcal polysaccharide (PPSV23) vaccine.** / 1 dose for all adults aged 40 years and older.  Meningococcal vaccine.** / Consult your health care provider.  Hepatitis A vaccine.** / Consult your health care provider.  Hepatitis B vaccine.** / Consult your health care provider.  Haemophilus influenzae type b (Hib) vaccine.** / Consult your health care provider. **Family history and personal history of risk and conditions may change your health care provider's recommendations. Document Released: 02/20/2001 Document Revised: 12/30/2012 Document Reviewed: 05/22/2010 [facility] Patient Information 2015 [place], [place]. This information is not intended to replace advice given to you by your health care provider. Make sure you discuss any questions you have with your health care provider.

## 2014-04-23 NOTE — Assessment & Plan Note (Signed)
Health maintenance topics discussed. Patient due for repeat digital examination. Encouraged patient to schedule. Depression screen negative. Immunizations are up-to-date. Will obtain fasting labs at today's visit.

## 2014-04-29 ENCOUNTER — Encounter: Payer: Self-pay | Admitting: *Deleted

## 2014-05-05 ENCOUNTER — Telehealth: Payer: Self-pay | Admitting: Physician Assistant

## 2014-05-05 NOTE — Telephone Encounter (Signed)
FYI-  Pt scheduled repeat urine lab for 05/06/14 due to abnormal findings.

## 2014-05-06 ENCOUNTER — Other Ambulatory Visit (INDEPENDENT_AMBULATORY_CARE_PROVIDER_SITE_OTHER): Payer: BLUE CROSS/BLUE SHIELD

## 2014-05-06 DIAGNOSIS — R8290 Unspecified abnormal findings in urine: Secondary | ICD-10-CM | POA: Diagnosis not present

## 2014-05-06 LAB — URINALYSIS, ROUTINE W REFLEX MICROSCOPIC
Bilirubin Urine: NEGATIVE
Ketones, ur: NEGATIVE
Leukocytes, UA: NEGATIVE
NITRITE: NEGATIVE
PH: 6 (ref 5.0–8.0)
Specific Gravity, Urine: 1.02 (ref 1.000–1.030)
TOTAL PROTEIN, URINE-UPE24: NEGATIVE
Urine Glucose: NEGATIVE
Urobilinogen, UA: 0.2 (ref 0.0–1.0)

## 2016-03-16 ENCOUNTER — Telehealth: Payer: Self-pay | Admitting: Physician Assistant

## 2016-03-16 NOTE — Telephone Encounter (Signed)
Patient is requesting to transfer care to dr wallace for geographic purposes.   Dr Juleen China, is this ok with you?  Einar Pheasant, is this ok with you ?

## 2016-03-16 NOTE — Telephone Encounter (Signed)
Ok with me 

## 2016-03-16 NOTE — Telephone Encounter (Signed)
okay

## 2016-04-04 ENCOUNTER — Telehealth: Payer: Self-pay | Admitting: *Deleted

## 2016-04-04 NOTE — Telephone Encounter (Signed)
PreVisit Call attempted. Left VM. 

## 2016-04-05 ENCOUNTER — Encounter: Payer: Self-pay | Admitting: Physician Assistant

## 2016-04-05 ENCOUNTER — Ambulatory Visit (INDEPENDENT_AMBULATORY_CARE_PROVIDER_SITE_OTHER): Payer: BLUE CROSS/BLUE SHIELD | Admitting: Physician Assistant

## 2016-04-05 VITALS — BP 148/98 | HR 72 | Temp 98.2°F | Ht 72.5 in | Wt >= 6400 oz

## 2016-04-05 DIAGNOSIS — I10 Essential (primary) hypertension: Secondary | ICD-10-CM | POA: Diagnosis not present

## 2016-04-05 DIAGNOSIS — G4733 Obstructive sleep apnea (adult) (pediatric): Secondary | ICD-10-CM | POA: Diagnosis not present

## 2016-04-05 DIAGNOSIS — Z0001 Encounter for general adult medical examination with abnormal findings: Secondary | ICD-10-CM

## 2016-04-05 DIAGNOSIS — K649 Unspecified hemorrhoids: Secondary | ICD-10-CM | POA: Diagnosis not present

## 2016-04-05 DIAGNOSIS — K219 Gastro-esophageal reflux disease without esophagitis: Secondary | ICD-10-CM | POA: Diagnosis not present

## 2016-04-05 DIAGNOSIS — Z9989 Dependence on other enabling machines and devices: Secondary | ICD-10-CM

## 2016-04-05 DIAGNOSIS — Z6841 Body Mass Index (BMI) 40.0 and over, adult: Secondary | ICD-10-CM | POA: Diagnosis not present

## 2016-04-05 LAB — LIPID PANEL
CHOL/HDL RATIO: 4
Cholesterol: 159 mg/dL (ref 0–200)
HDL: 42.4 mg/dL (ref >39.00)
LDL Cholesterol: 98 mg/dL (ref 0–99)
NONHDL: 116.13
Triglycerides: 93 mg/dL (ref 0.0–149.0)
VLDL: 18.6 mg/dL (ref 0.0–40.0)

## 2016-04-05 LAB — CBC WITH DIFFERENTIAL/PLATELET
BASOS ABS: 0.1 10*3/uL (ref 0.0–0.1)
Basophils Relative: 0.9 % (ref 0.0–3.0)
EOS ABS: 0.2 10*3/uL (ref 0.0–0.7)
Eosinophils Relative: 2.1 % (ref 0.0–5.0)
HCT: 50.3 % (ref 39.0–52.0)
Hemoglobin: 16.6 g/dL (ref 13.0–17.0)
Lymphocytes Relative: 26.6 % (ref 12.0–46.0)
Lymphs Abs: 1.9 10*3/uL (ref 0.7–4.0)
MCHC: 33 g/dL (ref 30.0–36.0)
MCV: 84.2 fl (ref 78.0–100.0)
MONOS PCT: 6.3 % (ref 3.0–12.0)
Monocytes Absolute: 0.5 10*3/uL (ref 0.1–1.0)
Neutro Abs: 4.6 10*3/uL (ref 1.4–7.7)
Neutrophils Relative %: 64.1 % (ref 43.0–77.0)
Platelets: 265 10*3/uL (ref 150.0–400.0)
RBC: 5.98 Mil/uL — AB (ref 4.22–5.81)
RDW: 14.4 % (ref 11.5–15.5)
WBC: 7.2 10*3/uL (ref 4.0–10.5)

## 2016-04-05 LAB — COMPREHENSIVE METABOLIC PANEL
ALK PHOS: 68 U/L (ref 39–117)
ALT: 30 U/L (ref 0–53)
AST: 25 U/L (ref 0–37)
Albumin: 4.4 g/dL (ref 3.5–5.2)
BILIRUBIN TOTAL: 0.7 mg/dL (ref 0.2–1.2)
BUN: 11 mg/dL (ref 6–23)
CO2: 30 mEq/L (ref 19–32)
CREATININE: 0.88 mg/dL (ref 0.40–1.50)
Calcium: 9.1 mg/dL (ref 8.4–10.5)
Chloride: 106 mEq/L (ref 96–112)
GFR: 103.7 mL/min (ref >60.00)
GLUCOSE: 98 mg/dL (ref 70–99)
Potassium: 4.5 mEq/L (ref 3.5–5.1)
Sodium: 142 mEq/L (ref 135–145)
TOTAL PROTEIN: 6.8 g/dL (ref 6.0–8.3)

## 2016-04-05 LAB — T4, FREE: FREE T4: 0.88 ng/dL (ref 0.60–1.60)

## 2016-04-05 LAB — TSH: TSH: 1.34 u[IU]/mL (ref 0.35–4.50)

## 2016-04-05 MED ORDER — AMLODIPINE BESYLATE 5 MG PO TABS: 5.0000 mg | ORAL_TABLET | Freq: Every day | ORAL | 0 refills | Status: DC

## 2016-04-05 MED ORDER — LIDOCAINE HCL 2 % EX GEL: 1.0000 "application " | CUTANEOUS | 0 refills | Status: DC | PRN

## 2016-04-05 MED ORDER — HYDROCORTISONE 2.5 % RE CREA: 1.0000 "application " | TOPICAL_CREAM | Freq: Two times a day (BID) | RECTAL | 0 refills | Status: DC

## 2016-04-05 NOTE — Assessment & Plan Note (Addendum)
Well controlled, takes occasional omeprazole with relief. Continue to avoid dietary triggers.

## 2016-04-05 NOTE — Assessment & Plan Note (Signed)
Provided prescription for Anusol and Lidocaine Jelly. Continue high fiber diet. Follow-up if lack of improvement or change in symptoms.

## 2016-04-05 NOTE — Patient Instructions (Signed)
It was great meeting you!  Please go to the lab, we will call you with your results when we get them.  Please measure your blood pressure daily, and keep a log for [place]. Follow-up with [place] in about 2 weeks so we can re-check your blood pressure. Take the Norvasc daily.  You may use the anusol and lidocaine jelly for your hemorrhoid. If there are any changes to your symptoms, please let us know.  I would like for you to work on getting a new sleep mask, let us know if you have problems with this and we can try to help.  Please send [place] a form if you'd like so we can help get you a standing desk or whatever is desired at your job.   Health Maintenance, Male A healthy lifestyle and preventive care is important for your health and wellness. Ask your health care provider about what schedule of regular examinations is right for you. What should I know about weight and diet?  Eat a Healthy Diet  Eat plenty of vegetables, fruits, whole grains, low-fat dairy products, and lean protein.  Do not eat a lot of foods high in solid fats, added sugars, or salt. Maintain a Healthy Weight  Regular exercise can help you achieve or maintain a healthy weight. You should:  Do at least 150 minutes of exercise each week. The exercise should increase your heart rate and make you sweat (moderate-intensity exercise).  Do strength-training exercises at least twice a week. Watch Your Levels of Cholesterol and Blood Lipids  Have your blood tested for lipids and cholesterol every 5 years starting at 37 years of age. If you are at high risk for heart disease, you should start having your blood tested when you are 36 years old. You may need to have your cholesterol levels checked more often if:  Your lipid or cholesterol levels are high.  You are older than 37 years of age.  You are at high risk for heart disease. What should I know about cancer screening? Many types of cancers can be detected early and may often be  prevented. Lung Cancer  You should be screened every year for lung cancer if:  You are a current smoker who has smoked for at least 30 years.  You are a former smoker who has quit within the past 15 years.  Talk to your health care provider about your screening options, when you should start screening, and how often you should be screened. Colorectal Cancer  Routine colorectal cancer screening usually begins at 37 years of age and should be repeated every 5-10 years until you are 37 years old. You may need to be screened more often if early forms of precancerous polyps or small growths are found. Your health care provider may recommend screening at an earlier age if you have risk factors for colon cancer.  Your health care provider may recommend using home test kits to check for hidden blood in the stool.  A small camera at the end of a tube can be used to examine your colon (sigmoidoscopy or colonoscopy). This checks for the earliest forms of colorectal cancer. Prostate and Testicular Cancer  Depending on your age and overall health, your health care provider may do certain tests to screen for prostate and testicular cancer.  Talk to your health care provider about any symptoms or concerns you have about testicular or prostate cancer. Skin Cancer  Check your skin from head to toe regularly.  Tell  your health care provider about any new moles or changes in moles, especially if:  There is a change in a mole's size, shape, or color.  You have a mole that is larger than a pencil eraser.  Always use sunscreen. Apply sunscreen liberally and repeat throughout the day.  Protect yourself by wearing long sleeves, pants, a wide-brimmed hat, and sunglasses when outside. What should I know about heart disease, diabetes, and high blood pressure?  If you are 23-51 years of age, have your blood pressure checked every 3-5 years. If you are 62 years of age or older, have your blood pressure  checked every year. You should have your blood pressure measured twice-once when you are at a hospital or clinic, and once when you are not at a hospital or clinic. Record the average of the two measurements. To check your blood pressure when you are not at a hospital or clinic, you can use:  An automated blood pressure machine at a pharmacy.  A home blood pressure monitor.  Talk to your health care provider about your target blood pressure.  If you are between 78-51 years old, ask your health care provider if you should take aspirin to prevent heart disease.  Have regular diabetes screenings by checking your fasting blood sugar level.  If you are at a normal weight and have a low risk for diabetes, have this test once every three years after the age of 51.  If you are overweight and have a high risk for diabetes, consider being tested at a younger age or more often.  A one-time screening for abdominal aortic aneurysm (AAA) by ultrasound is recommended for men aged 5-75 years who are current or former smokers. What should I know about preventing infection? Hepatitis B  If you have a higher risk for hepatitis B, you should be screened for this virus. Talk with your health care provider to find out if you are at risk for hepatitis B infection. Hepatitis C  Blood testing is recommended for:  Everyone born from [birth year] through 1966.  Anyone with known risk factors for hepatitis C. Sexually Transmitted Diseases (STDs)  You should be screened each year for STDs including gonorrhea and chlamydia if:  You are sexually active and are younger than 37 years of age.  You are older than 37 years of age and your health care provider tells you that you are at risk for this type of infection.  Your sexual activity has changed since you were last screened and you are at an increased risk for chlamydia or gonorrhea. Ask your health care provider if you are at risk.  Talk with your health care  provider about whether you are at high risk of being infected with HIV. Your health care provider may recommend a prescription medicine to help prevent HIV infection. What else can I do?  Schedule regular health, dental, and eye exams.  Stay current with your vaccines (immunizations).  Do not use any tobacco products, such as cigarettes, chewing tobacco, and e-cigarettes. If you need help quitting, ask your health care provider.  Limit alcohol intake to no more than 2 drinks per day. One drink equals 12 ounces of beer, 5 ounces of wine, or 1 ounces of hard liquor.  Do not use street drugs.  Do not share needles.  Ask your health care provider for help if you need support or information about quitting drugs.  Tell your health care provider if you often feel depressed.  Tell  your health care provider if you have ever been abused or do not feel safe at home. This information is not intended to replace advice given to you by your health care provider. Make sure you discuss any questions you have with your health care provider. Document Released: 06/23/2007 Document Revised: 08/24/2015 Document Reviewed: 09/28/2014 Elsevier Interactive Patient Education  2017 [employer].

## 2016-04-05 NOTE — Assessment & Plan Note (Signed)
Currently non-compliant with CPAP, I discussed that he needs to obtain a new mask and we can help with this if needed.

## 2016-04-05 NOTE — Assessment & Plan Note (Addendum)
Start Norvasc 5 mg. Baseline labs today. Follow-up with me in 2 weeks. Monitor BP at home. Getting OSA under control will help with this, as well as continued weight loss.

## 2016-04-05 NOTE — Progress Notes (Signed)
Pre visit review using our clinic review tool, if applicable. No additional management support is needed unless otherwise documented below in the visit note. 

## 2016-04-05 NOTE — Progress Notes (Addendum)
Aaron Frost is a 36 y.o. male here to Establish care and for Annual exam.   History of Present Illness:   Chief Complaint  Patient presents with  . Establish Care  . Annual Exam    Acute Concerns: HTN -- no HA, no chest pain, no SOB, no changes in vision; was on HCTZ in the past but it caused weight gain this was about 4-5 years ago, he has a BP monitor at home but does not check this regularly Hemorrhoid -- history of hemorrhoids, does have occasional bleeding from hard BM's, uses vaseline topically, denies significant pain  BP Readings from Last 3 Encounters:  04/05/16 (!) 148/98  04/23/14 132/78  04/09/14 110/80   Chronic Issues: GERD -- well controlled, omeprazole will take for a few days without relief, avoids high fat/spicy foods OSA -- 3 years ago, has not used CPAP since summer needs a new mask  Health Maintenance: Immunizations -- up to date Colonoscopy -- family hx of colon cancer, grandfather was 23 or 61 when he was diagnosed Diet -- currently doing a low carbohydrates, do not eat fruit, but eats all other food groups Caffeine intake -- drink caffeinated tea, coffee at church Sleep habits -- at least 6 hours, does have a history of sleep apnea Exercise -- brisk walking, 15,000 steps daily Weight -- Weight: (!) 401 lb 4 oz (182 kg) -- normal weight for patient, was about 430 lb; fluctuates with weather; lowest weight was 225lb Mood -- shy, issues with anxiety, panic attacks   Wt Readings from Last 3 Encounters:  04/05/16 (!) 401 lb 4 oz (182 kg)  04/23/14 (!) 355 lb 4 oz (161.1 kg)  04/09/14 (!) 360 lb 6 oz (163.5 kg)     Depression screen PHQ 2/9 04/05/2016  Decreased Interest 0  Down, Depressed, Hopeless 0  PHQ - 2 Score 0    Other providers/specialists: No other providers   PMHx, SurgHx, SocialHx, Medications, and Allergies were reviewed in the Visit Navigator and updated as appropriate.  Current Medications:   Current Outpatient  Prescriptions:  .  Ascorbic Acid (VITA-C PO), Take 1 packet by mouth 2 (two) times daily., Disp: , Rfl:  .  ibuprofen (ADVIL,MOTRIN) 200 MG tablet, Take 200 mg by mouth every 6 (six) hours as needed., Disp: , Rfl:  .  loratadine (CLARITIN) 10 MG tablet, Take 10 mg by mouth daily., Disp: , Rfl:  .  magnesium hydroxide (MILK OF MAGNESIA) 400 MG/5ML suspension, Take by mouth daily as needed for mild constipation., Disp: , Rfl:  .  Nutritional Supplements (COMPLETE PROTEIN/VITAMIN SHAKE PO), Take 1 each by mouth daily., Disp: , Rfl:  .  omeprazole (PRILOSEC OTC) 20 MG tablet, Take 20 mg by mouth daily., Disp: , Rfl:  .  amLODipine (NORVASC) 5 MG tablet, Take 1 tablet (5 mg total) by mouth daily., Disp: 30 tablet, Rfl: 0 .  hydrocortisone (ANUSOL-HC) 2.5 % rectal cream, Place 1 application rectally 2 (two) times daily., Disp: 30 g, Rfl: 0 .  lidocaine (XYLOCAINE) 2 % jelly, Apply 1 application topically as needed., Disp: 30 mL, Rfl: 0   Review of Systems:   Review of Systems  Constitutional: Negative.   HENT: Negative.   Eyes: Negative.   Respiratory: Negative.   Cardiovascular: Negative.   Gastrointestinal: Positive for constipation.       Mild constipation at times, Has hemorrhoid, occasional bleeding from hemorrhoid  Genitourinary: Negative.   Musculoskeletal: Positive for joint pain.  Bilateral knee pain  Skin:       Dry skin   Neurological: Negative.   Endo/Heme/Allergies: Positive for environmental allergies.       Pollen and dust  Psychiatric/Behavioral: Negative.     Vitals:   Vitals:   04/05/16 0847 04/05/16 0918  BP: (!) 160/100 (!) 148/98  Pulse: 72   Temp: 98.2 F (36.8 C)   TempSrc: Oral   SpO2: 96%   Weight: (!) 401 lb 4 oz (182 kg)   Height: 6' 0.5" (1.842 m)      Body mass index is 53.67 kg/m.  Physical Exam:   Physical Exam  Constitutional: He appears well-developed. He is cooperative.  Non-toxic appearance. He does not have a sickly appearance. He  does not appear ill. No distress.  HENT:  Head: Normocephalic and atraumatic.  Right Ear: Tympanic membrane normal. Tympanic membrane is not erythematous, not retracted and not bulging.  Left Ear: Tympanic membrane normal. Tympanic membrane is not erythematous, not retracted and not bulging.  Nose: Nose normal.  Mouth/Throat: Uvula is midline and oropharynx is clear and moist.  Eyes: Conjunctivae, EOM and lids are normal. Pupils are equal, round, and reactive to light.  Neck: Trachea normal.  Cardiovascular: Normal rate, regular rhythm, S1 normal, S2 normal, normal heart sounds and normal pulses.   No LE edema  Pulmonary/Chest: Effort normal and breath sounds normal.  Abdominal: Normal appearance and bowel sounds are normal. There is no tenderness.  Genitourinary: Rectal exam shows external hemorrhoid (small, non-thrombosed).  Lymphadenopathy:    He has no cervical adenopathy.  Neurological: He is alert.  Skin: Skin is warm, dry and intact.  Psychiatric: He has a normal mood and affect. His speech is normal and behavior is normal. Thought content normal.  Nursing note and vitals reviewed.     Assessment and Plan:    Problem List Items Addressed This Visit      Cardiovascular and Mediastinum   Hemorrhoids    Provided prescription for Anusol and Lidocaine Jelly. Continue high fiber diet. Follow-up if lack of improvement or change in symptoms.      Relevant Medications   amLODipine (NORVASC) 5 MG tablet   Essential hypertension    Start Norvasc 5 mg. Baseline labs today. Follow-up with me in 2 weeks. Monitor BP at home. Getting OSA under control will help with this, as well as continued weight loss.      Relevant Medications   amLODipine (NORVASC) 5 MG tablet   Other Relevant Orders   CBC with Differential/Platelet   Comprehensive metabolic panel   Lipid panel   T4, free   TSH     Respiratory   OSA on CPAP    Currently non-compliant with CPAP, I discussed that he needs  to obtain a new mask and we can help with this if needed.        Digestive   Gastroesophageal reflux disease    Well controlled, takes occasional omeprazole with relief. Continue to avoid dietary triggers.        Other   BMI 50.0-59.9, adult ([place])    Currently working on diet. Continue to get 15k steps daily. Hoping to get a stand-up desk for work, we can fill out this paperwork if he provides it for [place].      Relevant Orders   T4, free   TSH    Other Visit Diagnoses    Encounter for general adult medical examination with abnormal findings    -  Primary  Today patient counseled on age appropriate routine health concerns for screening and prevention, each reviewed and up to date or declined. Immunizations reviewed and up to date or declined. Labs ordered and reviewed. Risk factors for depression reviewed and negative. Hearing function and visual acuity are intact. ADLs screened and addressed as needed. Functional ability and level of safety reviewed and appropriate. Education, counseling and referrals performed based on assessed risks today. Patient provided with a copy of personalized plan for preventive services. He is agreeable to one time HIV screening today.   Relevant Orders   CBC with Differential/Platelet   Comprehensive metabolic panel   Lipid panel   HIV antibody   T4, free   TSH      . Reviewed expectations re: course of current medical issues. . Discussed self-management of symptoms. . Outlined signs and symptoms indicating need for more acute intervention. . Patient verbalized understanding and all questions were answered. . See orders for this visit as documented in the electronic medical record. . Patient received an After-Visit Summary.   Inda Coke, PA-C

## 2016-04-05 NOTE — Assessment & Plan Note (Signed)
Currently working on diet. Continue to get 15k steps daily. Hoping to get a stand-up desk for work, we can fill out this paperwork if he provides it for [place].

## 2016-04-06 LAB — HIV ANTIBODY (ROUTINE TESTING W REFLEX): HIV 1&2 Ab, 4th Generation: NONREACTIVE

## 2016-04-08 NOTE — Progress Notes (Signed)
Please call patient and let him know that all of the labwork that we completed came back normal, including kidney, liver, cholesterol, blood sugar, infection count, HIV and thyroid levels.

## 2016-04-19 ENCOUNTER — Encounter: Payer: Self-pay | Admitting: Physician Assistant

## 2016-04-19 ENCOUNTER — Ambulatory Visit (INDEPENDENT_AMBULATORY_CARE_PROVIDER_SITE_OTHER): Payer: BLUE CROSS/BLUE SHIELD | Admitting: Physician Assistant

## 2016-04-19 VITALS — BP 134/88 | HR 84 | Temp 98.1°F | Ht 72.5 in | Wt >= 6400 oz

## 2016-04-19 DIAGNOSIS — I10 Essential (primary) hypertension: Secondary | ICD-10-CM

## 2016-04-19 MED ORDER — AMLODIPINE BESYLATE 5 MG PO TABS: 5.0000 mg | ORAL_TABLET | Freq: Every day | ORAL | 0 refills | Status: DC

## 2016-04-19 NOTE — Progress Notes (Signed)
Pre visit review using our clinic review tool, if applicable. No additional management support is needed unless otherwise documented below in the visit note. 

## 2016-04-19 NOTE — Patient Instructions (Signed)
It was great seeing you today!  Let's follow-up in 3 months. Sooner if needed!

## 2016-04-19 NOTE — Progress Notes (Signed)
Aaron Frost is a 36 y.o. male is here to follow up on Hypertension  I acted as a Education administrator for [employer], PA-C Anselmo Pickler, LPN  History of Present Illness:   Chief Complaint  Patient presents with  . Follow-up  . Hypertension   HPI   Patient presents today for BP follow up.  BP Readings from Last 3 Encounters:  04/19/16 134/88  04/05/16 (!) 148/98  04/23/14 132/78   He reports that he is taking the Norvasc as scheduled. He has been monitoring his blood pressure and it has been ranging from 140-160's to 80-90's. He does report that he doesn't think that he is using a proper size cuff and suspects he needs a larger one. He denies chest pain, SOB, lower leg swelling. He does report some anxiety in his life -- starting going to a single's church group meeting and other commitments as well as had some dental issues he has been working through. He does not feel as though he needs anything for his anxiety. He has started to use his CPAP.   There are no preventive care reminders to display for this patient.  PMHx, SurgHx, SocialHx, FamHx, Medications, and Allergies were reviewed in the Visit Navigator and updated as appropriate.   Patient Active Problem List   Diagnosis Date Noted  . BMI 50.0-59.9, adult ([place]) 04/05/2016  . Hemorrhoids 04/05/2016  . Essential hypertension 04/05/2016  . Gastroesophageal reflux disease 04/05/2016  . Ventral hernia 12/24/2012  . Pain of left heel 11/24/2011  . OSA on CPAP 10/08/2011  . Leg swelling 10/07/2011  . Radicular pain in left arm 10/07/2011  . Obesity, unspecified 06/10/2007  . DEPRESSION 06/10/2007  . ALLERGIC RHINITIS 06/10/2007    Social History  Substance Use Topics  . Smoking status: Former Smoker    Packs/day: 1.50    Years: 3.00    Types: Cigarettes    Quit date: 01/09/2004  . Smokeless tobacco: Never Used  . Alcohol use Yes     Comment: Occassional glass of wine    Current Medications and Allergies:     Current Outpatient Prescriptions:  .  amLODipine (NORVASC) 5 MG tablet, Take 1 tablet (5 mg total) by mouth daily., Disp: 90 tablet, Rfl: 0 .  Ascorbic Acid (VITA-C PO), Take 1 packet by mouth 2 (two) times daily., Disp: , Rfl:  .  ibuprofen (ADVIL,MOTRIN) 200 MG tablet, Take 200 mg by mouth every 6 (six) hours as needed., Disp: , Rfl:  .  loratadine (CLARITIN) 10 MG tablet, Take 10 mg by mouth daily., Disp: , Rfl:  .  magnesium hydroxide (MILK OF MAGNESIA) 400 MG/5ML suspension, Take by mouth daily as needed for mild constipation., Disp: , Rfl:  .  Nutritional Supplements (COMPLETE PROTEIN/VITAMIN SHAKE PO), Take 1 each by mouth daily., Disp: , Rfl:  .  omeprazole (PRILOSEC OTC) 20 MG tablet, Take 20 mg by mouth daily., Disp: , Rfl:  .  hydrocortisone (ANUSOL-HC) 2.5 % rectal cream, Place 1 application rectally 2 (two) times daily. (Patient not taking: Reported on 04/19/2016), Disp: 30 g, Rfl: 0 .  lidocaine (XYLOCAINE) 2 % jelly, Apply 1 application topically as needed. (Patient not taking: Reported on 04/19/2016), Disp: 30 mL, Rfl: 0   Allergies  Allergen Reactions  . Aleve [Naproxen]     Burning sensation and headaches  . Nickel Rash    Review of Systems   Review of Systems  Constitutional: Negative.   HENT: Negative.   Eyes: Negative.  Respiratory: Negative.   Cardiovascular: Negative.   Gastrointestinal: Positive for heartburn.       On Omeprazole as PRN  Genitourinary: Negative.   Musculoskeletal: Positive for myalgias.  Skin: Negative.   Neurological: Positive for headaches.       Tension Headaches  Endo/Heme/Allergies: Positive for environmental allergies.  Psychiatric/Behavioral: The patient is nervous/anxious.        Since started on Amlodipine    Vitals:   Vitals:   04/19/16 1509  BP: 134/88  Pulse: 84  Temp: 98.1 F (36.7 C)  TempSrc: Oral  SpO2: 96%  Weight: (!) 407 lb 4 oz (184.7 kg)  Height: 6' 0.5" (1.842 m)     Body mass index is 54.47 kg/m.  Obese   Physical Exam:    Physical Exam  Constitutional: He appears well-developed. He is cooperative.  Non-toxic appearance. He does not have a sickly appearance. He does not appear ill. No distress.  Cardiovascular: Normal rate, regular rhythm, S1 normal, S2 normal, normal heart sounds and normal pulses.   No LE edema  Pulmonary/Chest: Effort normal and breath sounds normal.  Neurological: He is alert.  Nursing note and vitals reviewed.      Assessment and Plan:    There are no diagnoses linked to this encounter.  . Reviewed expectations re: course of current medical issues. . Discussed self-management of symptoms. . Outlined signs and symptoms indicating need for more acute intervention. . Patient verbalized understanding and all questions were answered. . See orders for this visit as documented in the electronic medical record. . Patient received an After Visit Summary.  CMA or LPN served as scribe during this visit. History, Physical, and Plan performed by medical provider. Documentation and orders reviewed and attested to.  Inda Coke, PA-C [place] 04/19/2016  Follow-up: Return in about 3 months (around 07/19/2016) for bp.

## 2016-04-19 NOTE — Assessment & Plan Note (Signed)
Continue Norvasc 5 mg. Follow-up in 3 weeks. Continue to use CPAP. Continue to monitor BP at home. Follow-up if needed sooner.

## 2016-07-19 ENCOUNTER — Ambulatory Visit: Payer: BLUE CROSS/BLUE SHIELD | Admitting: Physician Assistant

## 2016-08-27 ENCOUNTER — Ambulatory Visit: Payer: BLUE CROSS/BLUE SHIELD | Admitting: Physician Assistant

## 2016-09-20 ENCOUNTER — Ambulatory Visit: Payer: BLUE CROSS/BLUE SHIELD | Admitting: Physician Assistant

## 2016-10-05 ENCOUNTER — Ambulatory Visit: Payer: BLUE CROSS/BLUE SHIELD | Admitting: Physician Assistant

## 2016-11-02 ENCOUNTER — Ambulatory Visit: Payer: BLUE CROSS/BLUE SHIELD | Admitting: Physician Assistant

## 2016-11-02 DIAGNOSIS — Z0289 Encounter for other administrative examinations: Secondary | ICD-10-CM

## 2017-02-05 ENCOUNTER — Encounter: Payer: Self-pay | Admitting: Physician Assistant

## 2017-07-16 ENCOUNTER — Encounter: Payer: Self-pay | Admitting: Physician Assistant

## 2017-07-16 ENCOUNTER — Ambulatory Visit (INDEPENDENT_AMBULATORY_CARE_PROVIDER_SITE_OTHER): Payer: BLUE CROSS/BLUE SHIELD | Admitting: Physician Assistant

## 2017-07-16 ENCOUNTER — Ambulatory Visit (INDEPENDENT_AMBULATORY_CARE_PROVIDER_SITE_OTHER): Payer: BLUE CROSS/BLUE SHIELD

## 2017-07-16 VITALS — BP 136/90 | HR 73 | Temp 98.2°F | Ht 72.5 in | Wt >= 6400 oz

## 2017-07-16 DIAGNOSIS — E669 Obesity, unspecified: Secondary | ICD-10-CM

## 2017-07-16 DIAGNOSIS — R140 Abdominal distension (gaseous): Secondary | ICD-10-CM | POA: Diagnosis not present

## 2017-07-16 DIAGNOSIS — R1012 Left upper quadrant pain: Secondary | ICD-10-CM

## 2017-07-16 LAB — COMPREHENSIVE METABOLIC PANEL
ALT: 40 U/L (ref 0–53)
AST: 27 U/L (ref 0–37)
Albumin: 4.6 g/dL (ref 3.5–5.2)
Alkaline Phosphatase: 61 U/L (ref 39–117)
BUN: 9 mg/dL (ref 6–23)
CHLORIDE: 100 meq/L (ref 96–112)
CO2: 32 mEq/L (ref 19–32)
Calcium: 9.4 mg/dL (ref 8.4–10.5)
Creatinine, Ser: 0.93 mg/dL (ref 0.40–1.50)
GFR: 96.62 mL/min (ref >60.00)
Glucose, Bld: 95 mg/dL (ref 70–99)
POTASSIUM: 5 meq/L (ref 3.5–5.1)
Sodium: 137 mEq/L (ref 135–145)
TOTAL PROTEIN: 7.2 g/dL (ref 6.0–8.3)
Total Bilirubin: 0.6 mg/dL (ref 0.2–1.2)

## 2017-07-16 LAB — HEMOGLOBIN A1C: HEMOGLOBIN A1C: 5.7 % (ref 4.6–6.5)

## 2017-07-16 LAB — CBC
HEMATOCRIT: 50.1 % (ref 39.0–52.0)
Hemoglobin: 17 g/dL (ref 13.0–17.0)
MCHC: 33.9 g/dL (ref 30.0–36.0)
MCV: 83.4 fl (ref 78.0–100.0)
Platelets: 243 10*3/uL (ref 150.0–400.0)
RBC: 6.01 Mil/uL — AB (ref 4.22–5.81)
RDW: 14.1 % (ref 11.5–15.5)
WBC: 8.1 10*3/uL (ref 4.0–10.5)

## 2017-07-16 LAB — LIPASE: Lipase: 27 U/L (ref 11.0–59.0)

## 2017-07-16 LAB — H. PYLORI ANTIBODY, IGG: H Pylori IgG: NEGATIVE

## 2017-07-16 NOTE — Progress Notes (Signed)
Aaron Frost is a 37 y.o. male here for a new problem.  I acted as a Education administrator for [employer], PA-C Anselmo Pickler, LPN  History of Present Illness:   Chief Complaint  Patient presents with  . Abdominal Pain    LUQ    Abdominal Pain  This is a new problem. Episode onset: Started 2 weeks ago. The onset quality is gradual. The problem occurs daily (with certain foods). Duration: after eating and last 30-60 minutes. The problem has been unchanged (due to not having fatty foods). The pain is located in the LUQ. The pain is at a severity of 3/10. The pain is mild. The quality of the pain is cramping and aching. The abdominal pain does not radiate. Associated symptoms include belching, diarrhea (mucous and bile) and weight loss (Pt states has lost 15 pounds in the past 2 weeks). Pertinent negatives include no constipation, fever, headaches, myalgias, nausea or vomiting. Associated symptoms comments: Bloating and joint pain. The pain is aggravated by eating. Relieved by: Not eating certain foods, high fat content. Treatments tried: Gas-x and Immodium. The treatment provided no relief. His past medical history is significant for GERD.   He has a family history of diverticulitis. Colon cancer in paternal grandfather (dx age 38) and stomach cancer in paternal grandmother (dx age 21.) Has put himself on a "bland" diet over the past few weeks to help with his symptoms -- including potatoes, broccoli, peas. Also puts margarine on his potatoes to make sure he has a source of fat. He has significant bloating with dairy. Has been drinking "a gallon of gatorade daily" to stay hydrated. In his stools, he has yellow bile and mucus appearance. Denies recent travel.  No recent excessive alcohol intake. For his GERD, he stays away from trigger foods and takes omeprazole prn (approx 1 x week.) Denies chest pain, SOB. Has frequent urination which he attributes to from drinking more.  He reports about a 15 lb  weight loss over the past two weeks due to these dietary changes.  Wt Readings from Last 15 Encounters:  07/16/17 (!) 417 lb 6.1 oz (189.3 kg)  04/19/16 (!) 407 lb 4 oz (184.7 kg)  04/05/16 (!) 401 lb 4 oz (182 kg)  04/23/14 (!) 355 lb 4 oz (161.1 kg)  04/09/14 (!) 360 lb 6 oz (163.5 kg)  02/01/14 (!) 368 lb (166.9 kg)  01/26/13 (!) 409 lb 4 oz (185.6 kg)  12/24/12 (!) 397 lb (180.1 kg)  07/26/12 (!) 412 lb 1.9 oz (186.9 kg)  04/23/12 (!) 416 lb 9.6 oz (189 kg)  02/01/12 (!) 426 lb 12.8 oz (193.6 kg)  12/14/11 (!) 428 lb (194.1 kg)  11/22/11 (!) 420 lb 8 oz (190.7 kg)  10/17/11 (!) 419 lb (190.1 kg)  10/08/11 (!) 422 lb 3.2 oz (191.5 kg)     Past Medical History:  Diagnosis Date  . OSA on CPAP   . Umbilical hernia 9450     Social History   Socioeconomic History  . Marital status: Legally Separated    Spouse name: Not on file  . Number of children: Not on file  . Years of education: Not on file  . Highest education level: Not on file  Occupational History  . Occupation: Research scientist (physical sciences): Sports coach  Social Needs  . Financial resource strain: Not on file  . Food insecurity:    Worry: Not on file    Inability: Not on file  . Transportation needs:  Medical: Not on file    Non-medical: Not on file  Tobacco Use  . Smoking status: Former Smoker    Packs/day: 1.50    Years: 3.00    Pack years: 4.50    Types: Cigarettes    Last attempt to quit: 01/09/2004    Years since quitting: 13.5  . Smokeless tobacco: Never Used  Substance and Sexual Activity  . Alcohol use: Yes    Comment: Occassional glass of wine  . Drug use: No  . Sexual activity: Never  Lifestyle  . Physical activity:    Days per week: Not on file    Minutes per session: Not on file  . Stress: Not on file  Relationships  . Social connections:    Talks on phone: Not on file    Gets together: Not on file    Attends religious service: Not on file    Active member of club or organization:  Not on file    Attends meetings of clubs or organizations: Not on file    Relationship status: Not on file  . Intimate partner violence:    Fear of current or ex partner: Not on file    Emotionally abused: Not on file    Physically abused: Not on file    Forced sexual activity: Not on file  Other Topics Concern  . Not on file  Social History Narrative   Work for [employer] -- sedentary   Divorced, Dec 2015    Live in [place]: walk, watch TV    Past Surgical History:  Procedure Laterality Date  . EXTERNAL EAR SURGERY     right ear reconstruction  . MOLE REMOVAL     [place]aco of Neck  . TONSILLECTOMY  2005    Family History  Problem Relation Age of Onset  . Colon cancer Paternal Grandfather 69  . Heart disease Paternal Grandfather   . Stroke Paternal Grandfather   . Heart disease Father   . Hypertension Mother        grandfather/uncles  . Diabetes Maternal Grandfather   . Cancer Sister        male  . Cancer Paternal Grandmother 24       stomach  . Breast cancer Neg Hx   . Prostate cancer Neg Hx     Allergies  Allergen Reactions  . Aleve [Naproxen]     Burning sensation and headaches  . Nickel Rash    Current Medications:   Current Outpatient Medications:  .  Ascorbic Acid (VITA-C PO), Take 1 packet by mouth 2 (two) times daily., Disp: , Rfl:  .  ibuprofen (ADVIL,MOTRIN) 200 MG tablet, Take 200 mg by mouth every 6 (six) hours as needed., Disp: , Rfl:  .  loratadine (CLARITIN) 10 MG tablet, Take 10 mg by mouth daily as needed. , Disp: , Rfl:  .  omeprazole (PRILOSEC OTC) 20 MG tablet, Take 20 mg by mouth as needed. , Disp: , Rfl:  .  amLODipine (NORVASC) 5 MG tablet, Take 1 tablet (5 mg total) by mouth daily. (Patient not taking: Reported on 07/16/2017), Disp: 90 tablet, Rfl: 0   Review of Systems:   Review of Systems  Constitutional: Positive for weight loss (Pt states has lost 15 pounds in the past 2 weeks). Negative for fever.  Gastrointestinal:  Positive for abdominal pain and diarrhea (mucous and bile). Negative for constipation, nausea and vomiting.  Musculoskeletal: Negative for myalgias.  Neurological: Negative for headaches.  Vitals:   Vitals:   07/16/17 1349  BP: 136/90  Pulse: 73  Temp: 98.2 F (36.8 C)  TempSrc: Oral  SpO2: 97%  Weight: (!) 417 lb 6.1 oz (189.3 kg)  Height: 6' 0.5" (1.842 m)     Body mass index is 55.83 kg/m.  Physical Exam:   Physical Exam  Constitutional: He appears well-developed. He is cooperative.  Non-toxic appearance. He does not have a sickly appearance. He does not appear ill. No distress.  Cardiovascular: Normal rate, regular rhythm, S1 normal, S2 normal, normal heart sounds and normal pulses.  No LE edema  Pulmonary/Chest: Effort normal and breath sounds normal.  Abdominal: Normal appearance and bowel sounds are normal. There is no tenderness. There is no rigidity, no rebound, no guarding, no CVA tenderness, no tenderness at McBurney's point and negative Murphy's sign.  Difficult to assess abdomen due to habitus  Neurological: He is alert. GCS eye subscore is 4. GCS verbal subscore is 5. GCS motor subscore is 6.  Skin: Skin is warm, dry and intact.  Psychiatric: He has a normal mood and affect. His speech is normal and behavior is normal.  Nursing note and vitals reviewed.  Abdominal xray with stool burden, awaiting official read  Assessment and Plan:    Jacori was seen today for abdominal pain.  Diagnoses and all orders for this visit:  LUQ abdominal pain No red flags on exam. Reviewed abdominal xray with patient, recommend Miralax regimen to help with improving regularity of stools. Push fluids and work on increasing fiber in diet. Also check labs today per orders. I also recommended regular use of omeprazole for the next two weeks. Follow-up if symptoms worsen or persist. Patient is agreeable to plan. -     DG Abd 2 Views; Future -     CBC -     Comprehensive metabolic  panel -     Lipase -     H. pylori antibody, IgG  Obesity, unspecified classification, unspecified obesity type, unspecified whether serious comorbidity present He is strongly considering bariatric surgery. I discussed with him that he would be a great candidate. I provided him with literature and he will let us know when he is ready for a referral. Will continue to encourage at each visit. -     Hemoglobin A1c   . Reviewed expectations re: course of current medical issues. . Discussed self-management of symptoms. . Outlined signs and symptoms indicating need for more acute intervention. . Patient verbalized understanding and all questions were answered. . See orders for this visit as documented in the electronic medical record. . Patient received an After-Visit Summary.  CMA or LPN served as scribe during this visit. History, Physical, and Plan performed by medical provider. Documentation and orders reviewed and attested to.  Inda Coke, PA-C

## 2017-07-16 NOTE — Patient Instructions (Signed)
It was great to see you!  Start with 1 capful of Miralax daily and mix in the beverage of your choice. May increase by 1/2 capfuls as needed.  Goal is daily soft, formed bowel movements.  Please let us know if you have worsening or no improvement of symptoms. We are going to call you with your lab results.  Follow-up soon for a physical.   High-Fiber Diet Fiber, also called dietary fiber, is a type of carbohydrate found in fruits, vegetables, whole grains, and beans. A high-fiber diet can have many health benefits. Your health care provider may recommend a high-fiber diet to help:  Prevent constipation. Fiber can make your bowel movements more regular.  Lower your cholesterol.  Relieve hemorrhoids, uncomplicated diverticulosis, or irritable bowel syndrome.  Prevent overeating as part of a weight-loss plan.  Prevent heart disease, type 2 diabetes, and certain cancers.  What is my plan? The recommended daily intake of fiber includes:  38 grams for men under age 67.  11 grams for men over age 66.  9 grams for women under age 53.  67 grams for women over age 2.  You can get the recommended daily intake of dietary fiber by eating a variety of fruits, vegetables, grains, and beans. Your health care provider may also recommend a fiber supplement if it is not possible to get enough fiber through your diet. What do I need to know about a high-fiber diet?  Fiber supplements have not been widely studied for their effectiveness, so it is better to get fiber through food sources.  Always check the fiber content on thenutrition facts label of any prepackaged food. Look for foods that contain at least 5 grams of fiber per serving.  Ask your dietitian if you have questions about specific foods that are related to your condition, especially if those foods are not listed in the following section.  Increase your daily fiber consumption gradually. Increasing your intake of dietary fiber too  quickly may cause bloating, cramping, or gas.  Drink plenty of water. Water helps you to digest fiber. What foods can I eat? Grains Whole-grain breads. Multigrain cereal. Oats and oatmeal. Brown rice. Barley. Bulgur wheat. [place]. Bran muffins. Popcorn. Rye wafer crackers. Vegetables Sweet potatoes. Spinach. Kale. Artichokes. Cabbage. Broccoli. Green peas. Carrots. Squash. Fruits Berries. Pears. Apples. Oranges. Avocados. Prunes and raisins. Dried figs. Meats and Other Protein Sources [place], kidney, pinto, and soy beans. Split peas. Lentils. Nuts and seeds. Dairy Fiber-fortified yogurt. Beverages Fiber-fortified soy milk. Fiber-fortified orange juice. Other Fiber bars. The items listed above may not be a complete list of recommended foods or beverages. Contact your dietitian for more options. What foods are not recommended? Grains White bread. Pasta made with refined flour. White rice. Vegetables Fried potatoes. Canned vegetables. Well-cooked vegetables. Fruits Fruit juice. Cooked, strained fruit. Meats and Other Protein Sources Fatty cuts of meat. Fried Sales executive or fried fish. Dairy Milk. Yogurt. Cream cheese. Sour cream. Beverages Soft drinks. Other Cakes and pastries. Butter and oils. The items listed above may not be a complete list of foods and beverages to avoid. Contact your dietitian for more information. What are some tips for including high-fiber foods in my diet?  Eat a wide variety of high-fiber foods.  Make sure that half of all grains consumed each day are whole grains.  Replace breads and cereals made from refined flour or white flour with whole-grain breads and cereals.  Replace white rice with brown rice, bulgur wheat, or millet.  Start  the day with a breakfast that is high in fiber, such as a cereal that contains at least 5 grams of fiber per serving.  Use beans in place of meat in soups, salads, or pasta.  Eat high-fiber snacks, such as berries, raw  vegetables, nuts, or popcorn. This information is not intended to replace advice given to you by your health care provider. Make sure you discuss any questions you have with your health care provider. Document Released: 12/25/2004 Document Revised: 06/02/2015 Document Reviewed: 06/09/2013 Elsevier Interactive Patient Education  [employer].

## 2017-07-24 ENCOUNTER — Ambulatory Visit (INDEPENDENT_AMBULATORY_CARE_PROVIDER_SITE_OTHER): Payer: BLUE CROSS/BLUE SHIELD | Admitting: Family Medicine

## 2017-07-24 ENCOUNTER — Encounter: Payer: Self-pay | Admitting: Family Medicine

## 2017-07-24 ENCOUNTER — Ambulatory Visit: Payer: Self-pay | Admitting: *Deleted

## 2017-07-24 VITALS — BP 118/92 | HR 78 | Temp 98.3°F | Ht 72.5 in | Wt >= 6400 oz

## 2017-07-24 DIAGNOSIS — R0982 Postnasal drip: Secondary | ICD-10-CM

## 2017-07-24 DIAGNOSIS — J0100 Acute maxillary sinusitis, unspecified: Secondary | ICD-10-CM

## 2017-07-24 MED ORDER — FLUTICASONE PROPIONATE 50 MCG/ACT NA SUSP: 2.0000 | Freq: Every day | NASAL | 6 refills | Status: DC

## 2017-07-24 MED ORDER — AMOXICILLIN-POT CLAVULANATE 875-125 MG PO TABS
1.0000 | ORAL_TABLET | Freq: Two times a day (BID) | ORAL | 0 refills | Status: DC
Start: 2017-07-24 — End: 2017-08-09

## 2017-07-24 NOTE — Telephone Encounter (Signed)
Pt has had fever for 3 days, last night it was 101.1, this morning 100.7. Pt has also had drainage, coughing, and spitting out mucus since Monday night. Pt requesting call back with advice.  Patient is calling to report he has had fever for 3 days with other symptoms- cough and sinus drainage.  Reason for Disposition . Fever present > 3 days (72 hours)  Answer Assessment - Initial Assessment Questions 1. ONSET: "When did the cough begin?"      Monday 2. SEVERITY: "How bad is the cough today?"      Constant tickle in throat  3. RESPIRATORY DISTRESS: "Describe your breathing."      Hard to talk at times- breathing is ok 4. FEVER: "Do you have a fever?" If so, ask: "What is your temperature, how was it measured, and when did it start?"     100.7 this morning- oral thermometer 5. SPUTUM: "Describe the color of your sputum" (clear, white, yellow, green)     Yellowish with little blood 6. HEMOPTYSIS: "Are you coughing up any blood?" If so ask: "How much?" (flecks, streaks, tablespoons, etc.)     Streaks- little bit 7. CARDIAC HISTORY: "Do you have any history of heart disease?" (e.g., heart attack, congestive heart failure)      no 8. LUNG HISTORY: "Do you have any history of lung disease?"  (e.g., pulmonary embolus, asthma, emphysema)     no 9. PE RISK FACTORS: "Do you have a history of blood clots?" (or: recent major surgery, recent prolonged travel, bedridden)     no 10. OTHER SYMPTOMS: "Do you have any other symptoms?" (e.g., runny nose, wheezing, chest pain)       Congestion, sore and achy  11. PREGNANCY: "Is there any chance you are pregnant?" "When was your last menstrual period?"       n/a 12. TRAVEL: "Have you traveled out of the country in the last month?" (e.g., travel history, exposures)       no  Protocols used: [place]

## 2017-07-24 NOTE — Telephone Encounter (Signed)
I called to offer Aaron Frost's 12:40pm slot however, patient is in [place] and can not make that time which is why the 1:45pm appointment with Dr. Rogers Blocker is scheduled. No further action required.

## 2017-07-24 NOTE — Telephone Encounter (Signed)
Please call pt and offer 12:40 appointment with Aldona Bar if pt can not come then needs to see another provider. Will not treat without being seen.

## 2017-07-24 NOTE — Telephone Encounter (Signed)
Noted  

## 2017-07-24 NOTE — Telephone Encounter (Signed)
See note

## 2017-07-24 NOTE — Progress Notes (Signed)
Patient: Aaron Frost MRN: [id number] DOB: Dec 20, 1980 PCP: Inda Coke, PA     Subjective:  Chief Complaint  Patient presents with  . Cough  . Fever    HPI: The patient is a 37 y.o. male who presents today for cough, fever x 4-5 days. Friday night he started to feel bad with burning eyes. Monday morning he woke up with subjective fever. 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 His temperature was 101 last night and 100.7 this AM. He also has post nasal drip with a sore throat, productive cough that has some blood/irritation in it. He states his mucous is yellow with little streaks of blood in it. No shortness of breath or wheezing. Cough wakes him up at night.  No asthma, smoking. He has some sinus tenderness (maxillary) and gum tenderness. No over the counter medications have been tried except for tylenol. He does report + sick contacts at work. "post nasal drip that does not go away." last dose of tylenol was  Yesterday AM.   Also has some questions in regards to his miralax.   Review of Systems  Constitutional: Positive for chills, fatigue and fever. Negative for appetite change.  HENT: Positive for sinus pressure, sinus pain and sore throat. Negative for congestion and ear pain.   Respiratory: Positive for cough. Negative for shortness of breath.   Cardiovascular: Negative for chest pain.  Gastrointestinal: Negative for nausea.  Musculoskeletal: Positive for neck stiffness.  Skin: Negative.   Neurological: Positive for headaches. Negative for dizziness.    Allergies Patient is allergic to aleve [naproxen] and nickel.  Past Medical History Patient  has a past medical history of OSA on CPAP and Umbilical hernia (7510).  Surgical History Patient  has a past surgical history that includes Tonsillectomy (2005); External ear surgery; and Mole removal.  Family History Pateint's family history includes Cancer in his sister; Cancer (age of onset: 73) in his  paternal grandmother; Colon cancer (age of onset: 56) in his paternal grandfather; Diabetes in his maternal grandfather; Heart disease in his father and paternal grandfather; Hypertension in his mother; Stroke in his paternal grandfather.  Social History Patient  reports that he quit smoking about 13 years ago. His smoking use included cigarettes. He has a 4.50 pack-year smoking history. He has never used smokeless tobacco. He reports that he drinks alcohol. He reports that he does not use drugs.    Objective: Vitals:   07/24/17 1351  BP: (!) 118/92  Pulse: 78  Temp: 98.3 F (36.8 C)  TempSrc: Oral  SpO2: 95%  Weight: (!) 412 lb (186.9 kg)  Height: 6' 0.5" (1.842 m)    Body mass index is 55.11 kg/m.  Physical Exam  Constitutional:  Obese.   HENT:  Right Ear: External ear normal.  Left Ear: External ear normal.  Nose: Nose normal.  Mouth/Throat: No oropharyngeal exudate.  Erythema on posterior pharynx with some cobblestoning. Not impressive tenderness over sinuses.  TM pearly with light reflex bilaterally   Eyes: Right eye exhibits no discharge. Left eye exhibits no discharge.  Neck: Normal range of motion. Neck supple.  Cardiovascular: Normal rate, regular rhythm and normal heart sounds.  Pulmonary/Chest: Effort normal and breath sounds normal. No respiratory distress. He has no wheezes. He has no rales.  Abdominal: Soft. Bowel sounds are normal.  Lymphadenopathy:    He has no cervical adenopathy.  Vitals reviewed.      Assessment/plan: 1. Acute maxillary sinusitis, recurrence not specified Possible fever.  Will treat with augmentin/flonase. Also recommended cool mist humidifier. Side effects of augmentin discussed and advised he start probiotic while on it. Stay at home to work until fever free x 24 hours. Continue tylenol prn for fevers. Let us know if not getting better or  If symptoms worsen/change.   2. Post-nasal drip flonase sent in. Instructed on proper use of  this and that can sometimes dry out and cause nose bleeds.   3. Discussed colace and miralax are different drugs with different mechanisms and that miralax does not soften stools. It helps with water retention to increase frequency of stools. If he feels like stools are hard, then would add on colace and continue high fiber diet as suggested by pcp. Has follow up with her in a week for further work up of this issue.   Return if symptoms worsen or fail to improve.     Orma Flaming, MD [place]   07/24/2017

## 2017-07-30 NOTE — Progress Notes (Addendum)
Patient: Aaron Frost MRN: [id number] DOB: 10/06/1980 PCP: Inda Coke, PA     Subjective:  Chief Complaint  Patient presents with  . Cough    worsening cough    HPI: The patient is a 37 y.o. male who presents today for worsening cough x 1-1 1/2 wks. I saw him on 07/24/17 and gave him augmentin. Since I saw him on the 17th he states it is harder to breath, his voice is gone and it's harder to eat. He has loss of appetite as well. He feels like he has had low grade fevers, but doesn't think anything over 100. He feels like it has really moved into his lungs. He feels like it's worse at night because he can not sleep. Not able to use his cpap. He thinks he has wheezing. Non productive cough. Tried mucinex with some help. Taking augmentin as prescribed. Sinuses have cleared up.   Review of Systems  Constitutional: Positive for activity change, chills, diaphoresis, fatigue and fever.  HENT: Positive for congestion.   Respiratory: Positive for cough and wheezing. Negative for chest tightness and shortness of breath.   Cardiovascular: Negative for chest pain.  Gastrointestinal: Positive for nausea. Negative for abdominal pain.  Neurological: Positive for headaches. Negative for dizziness.  Psychiatric/Behavioral: The patient is not nervous/anxious.     Allergies Patient is allergic to aleve [naproxen] and nickel.  Past Medical History Patient  has a past medical history of OSA on CPAP and Umbilical hernia (4098).  Surgical History Patient  has a past surgical history that includes Tonsillectomy (2005); External ear surgery; and Mole removal.  Family History Pateint's family history includes Cancer in his sister; Cancer (age of onset: 74) in his paternal grandmother; Colon cancer (age of onset: 53) in his paternal grandfather; Diabetes in his maternal grandfather; Heart disease in his father and paternal grandfather; Hypertension in his mother; Stroke in his paternal  grandfather.  Social History Patient  reports that he quit smoking about 13 years ago. His smoking use included cigarettes. He has a 4.50 pack-year smoking history. He has never used smokeless tobacco. He reports that he drinks alcohol. He reports that he does not use drugs.    Objective: Vitals:   07/31/17 0800 07/31/17 0846  BP: (!) 158/84   Pulse: (!) 104 90  Temp: 99.6 F (37.6 C)   TempSrc: Oral   SpO2: 91% 93%  Weight: (!) 414 lb 6.4 oz (188 kg)   Height: 6' 0.5" (1.842 m)     Body mass index is 55.43 kg/m.  Physical Exam  Constitutional: He is oriented to person, place, and time. He appears well-developed and well-nourished.  obese  HENT:  Right Ear: External ear normal.  Left Ear: External ear normal.  Nose: Nose normal.  Mouth/Throat: Oropharynx is clear and moist. No oropharyngeal exudate.  Eyes: Conjunctivae are normal.  Cardiovascular: Regular rhythm and normal heart sounds.  Tachycardic   Pulmonary/Chest: Effort normal. No respiratory distress. He has wheezes. He has no rales.  Decreased air movement in upper lobes. Very occasional expiratory wheeze in lower lobes. No rales.   Abdominal: Soft. Bowel sounds are normal.  Lymphadenopathy:    He has no cervical adenopathy.  Neurological: He is alert and oriented to person, place, and time.  Vitals reviewed.  CXR: bronchitis and ? Beginning pneumonia. More prominent area of right hilar region.     Assessment/plan: 1. Bronchitis Responded well to albuterol nebulizer. We are going to start steroid burst and scheduled albuterol inhaler  for next 2 days then as needed. Cool mist humidifier, honey and tessalon pearls. Also changing antibiotics to levaquin for possible pneumonia. See below. Side effects of steroids discussed including headache, insomnia, gastritis and elevated blood pressure.  - DG Chest 2 View; Future  2. Pneumonia due to organism Stopping augmentin and changing to levaquin. Discussed he is  borderline sending to ER since he technically has failed abx, but was treating more for sinusitis and clinically he looks good/stable.  Discussed he is to take steroids, albuterol and levaquin, but if any worsening symptoms, fever, shortness of breath he must go to ER. Understands my precautions. Gave him my nurses direct line as well if he has any questions and he is going to stay with someone to have someone watch him. If not better by tonight or tomorrow  He will go to ER. Also will need follow up CXR in 4 weeks to check hilar prominence and he was instructed on this as well. Will send note to pcp to make sure and follow this up.       Return if symptoms worsen or fail to improve.     Orma Flaming, MD [facility]   07/31/2017

## 2017-07-31 ENCOUNTER — Ambulatory Visit (INDEPENDENT_AMBULATORY_CARE_PROVIDER_SITE_OTHER): Payer: BLUE CROSS/BLUE SHIELD | Admitting: Family Medicine

## 2017-07-31 ENCOUNTER — Encounter: Payer: Self-pay | Admitting: Family Medicine

## 2017-07-31 ENCOUNTER — Telehealth: Payer: Self-pay | Admitting: Family Medicine

## 2017-07-31 ENCOUNTER — Ambulatory Visit (INDEPENDENT_AMBULATORY_CARE_PROVIDER_SITE_OTHER): Payer: BLUE CROSS/BLUE SHIELD

## 2017-07-31 VITALS — BP 158/84 | HR 90 | Temp 99.6°F | Ht 72.5 in | Wt >= 6400 oz

## 2017-07-31 DIAGNOSIS — J189 Pneumonia, unspecified organism: Secondary | ICD-10-CM

## 2017-07-31 DIAGNOSIS — J40 Bronchitis, not specified as acute or chronic: Secondary | ICD-10-CM

## 2017-07-31 DIAGNOSIS — R059 Cough, unspecified: Secondary | ICD-10-CM

## 2017-07-31 DIAGNOSIS — R05 Cough: Secondary | ICD-10-CM

## 2017-07-31 MED ORDER — PREDNISONE 20 MG PO TABS: 40.0000 mg | ORAL_TABLET | Freq: Every day | ORAL | 0 refills | Status: DC

## 2017-07-31 MED ORDER — ALBUTEROL SULFATE (2.5 MG/3ML) 0.083% IN NEBU
2.5000 mg | INHALATION_SOLUTION | Freq: Once | RESPIRATORY_TRACT | Status: AC
  Administered 2017-07-31: 2.5 mg via RESPIRATORY_TRACT

## 2017-07-31 MED ORDER — ALBUTEROL SULFATE HFA 108 (90 BASE) MCG/ACT IN AERS: 2.0000 | INHALATION_SPRAY | Freq: Four times a day (QID) | RESPIRATORY_TRACT | 0 refills | Status: DC | PRN

## 2017-07-31 MED ORDER — LEVOFLOXACIN 500 MG PO TABS: 500.0000 mg | ORAL_TABLET | Freq: Every day | ORAL | 0 refills | Status: DC

## 2017-07-31 MED ORDER — BENZONATATE 100 MG PO CAPS: 100.0000 mg | ORAL_CAPSULE | Freq: Two times a day (BID) | ORAL | 0 refills | Status: DC | PRN

## 2017-07-31 NOTE — Telephone Encounter (Signed)
Aaron Frost, when you call and check on him let him know he has to come back for repeat CXR in 4 weeks ot make sure area in upper lung goes back to how it used to look. If this still looks abnormal, then we will need to do CT of his chest. Hopefully just enlarged from this bronchitis/possible pneumonia

## 2017-07-31 NOTE — Progress Notes (Unsigned)
Pt called in to report that he feels slightly better after taking first dose of Levaquin and eating.  He took a short nap w/CPAP machine and is now resting comfortably.  Advised I will follow up with him in the morning.

## 2017-07-31 NOTE — Patient Instructions (Signed)
Stop augmentin... You will be starting levaquin once/day   Prednisone is a steroid. You will take 40mg  (2 pills) daily for 5 days. Take with food. Can increase blood pressure, give a headache and keep you up.   Albuterol inhaler. Use as needed every 4-6 hours, but would do this scheduled for next 2 days then as needed.   mucinex to break up stuff in lungs or robitussin DM

## 2017-07-31 NOTE — Telephone Encounter (Signed)
Called and left voicemail message on patient's cell phone to see how he is feeling.  Advised to contact me back on my direct line before 4 pm or after 4, call our main line, the [facility] would take msg and pass on to me.

## 2017-08-01 NOTE — Progress Notes (Unsigned)
Called and spoke to patient.  He is improving and feeling better today.  Sleeping and eating well and taking meds as directed.  Breathing is better with medications.  Advised patient he would need repeat CXR in 4 wks.  Pt verbalized understanding.

## 2017-08-02 ENCOUNTER — Telehealth: Payer: Self-pay | Admitting: *Deleted

## 2017-08-02 ENCOUNTER — Telehealth: Payer: Self-pay

## 2017-08-02 NOTE — Telephone Encounter (Signed)
Returned call to [facility] per request and spoke to [place] regarding claim# [id number].  Gave info of patient's diagnosis since claim initiated and treatment plan etc.

## 2017-08-02 NOTE — Telephone Encounter (Signed)
Copied from [place] [phone]. Topic: General - Other >> Aug 01, 2017  3:52 PM Valla Leaver wrote: Reason for CRM: [employer] needs to verify patient diagnosis, date work was ended, treatment plans, restrictions and limitations. KGMWN#[id number]

## 2017-08-05 ENCOUNTER — Encounter: Payer: BLUE CROSS/BLUE SHIELD | Admitting: Physician Assistant

## 2017-08-06 ENCOUNTER — Telehealth: Payer: Self-pay

## 2017-08-06 NOTE — Telephone Encounter (Signed)
Pt called in to report concerns of SOB when climbing stairs and he feels like he has "decreased lung capacity".  Feels that the Tessalon perles may be causing the SOB? Denies any chest pain or SOB at rest, no fever, eating and drinking fine.  His parents are concerned because of his SOB w/his pneumonia dx.  Pt was staying with parents who live in a one story home.  Pt lives in an apt on the third floor.  He has gone back home and seems to only experience SOB on exertion when climbing stairs.  He last took the Tessalon perles the morning of 7/29.  Advised patient it would be a good idea to stay with parents again for another week or so.  The less exertion that he puts on his lungs while his body is healing from the pneumonia, the better.  Advised pt of red flag symptoms for going to ER.  Pt verbalized understanding.

## 2017-08-07 NOTE — Telephone Encounter (Signed)
If not better, fever/chills etc. F/u with PCP. Also make sure he is expanding lungs and doing stuff. Will take him a while to get back to baseline.

## 2017-08-07 NOTE — Telephone Encounter (Signed)
Pt notified of information per Dr. Rogers Blocker and pt verbalized understanding.

## 2017-08-09 ENCOUNTER — Emergency Department (HOSPITAL_COMMUNITY): Payer: BLUE CROSS/BLUE SHIELD

## 2017-08-09 ENCOUNTER — Encounter (HOSPITAL_COMMUNITY): Payer: Self-pay | Admitting: *Deleted

## 2017-08-09 ENCOUNTER — Encounter: Payer: Self-pay | Admitting: Physician Assistant

## 2017-08-09 ENCOUNTER — Inpatient Hospital Stay (HOSPITAL_COMMUNITY)
Admission: EM | Admit: 2017-08-09 | Discharge: 2017-08-11 | DRG: 202 | Disposition: A | Discharge status: Home / Self Care | Payer: BLUE CROSS/BLUE SHIELD | Attending: Internal Medicine | Admitting: Internal Medicine

## 2017-08-09 ENCOUNTER — Other Ambulatory Visit: Payer: Self-pay

## 2017-08-09 ENCOUNTER — Ambulatory Visit (INDEPENDENT_AMBULATORY_CARE_PROVIDER_SITE_OTHER): Payer: BLUE CROSS/BLUE SHIELD | Admitting: Physician Assistant

## 2017-08-09 VITALS — BP 124/80 | HR 82 | Temp 99.1°F | Ht 72.5 in | Wt >= 6400 oz

## 2017-08-09 DIAGNOSIS — I10 Essential (primary) hypertension: Secondary | ICD-10-CM | POA: Diagnosis not present

## 2017-08-09 DIAGNOSIS — Z8249 Family history of ischemic heart disease and other diseases of the circulatory system: Secondary | ICD-10-CM

## 2017-08-09 DIAGNOSIS — Z6841 Body Mass Index (BMI) 40.0 and over, adult: Secondary | ICD-10-CM

## 2017-08-09 DIAGNOSIS — J9601 Acute respiratory failure with hypoxia: Secondary | ICD-10-CM | POA: Diagnosis present

## 2017-08-09 DIAGNOSIS — J45909 Unspecified asthma, uncomplicated: Secondary | ICD-10-CM

## 2017-08-09 DIAGNOSIS — Z886 Allergy status to analgesic agent status: Secondary | ICD-10-CM

## 2017-08-09 DIAGNOSIS — J9801 Acute bronchospasm: Secondary | ICD-10-CM | POA: Diagnosis not present

## 2017-08-09 DIAGNOSIS — J302 Other seasonal allergic rhinitis: Secondary | ICD-10-CM | POA: Diagnosis not present

## 2017-08-09 DIAGNOSIS — G4733 Obstructive sleep apnea (adult) (pediatric): Secondary | ICD-10-CM

## 2017-08-09 DIAGNOSIS — R0902 Hypoxemia: Secondary | ICD-10-CM | POA: Diagnosis not present

## 2017-08-09 DIAGNOSIS — Z87891 Personal history of nicotine dependence: Secondary | ICD-10-CM

## 2017-08-09 DIAGNOSIS — Z833 Family history of diabetes mellitus: Secondary | ICD-10-CM

## 2017-08-09 DIAGNOSIS — R739 Hyperglycemia, unspecified: Secondary | ICD-10-CM | POA: Diagnosis present

## 2017-08-09 DIAGNOSIS — D649 Anemia, unspecified: Secondary | ICD-10-CM

## 2017-08-09 DIAGNOSIS — J189 Pneumonia, unspecified organism: Secondary | ICD-10-CM

## 2017-08-09 DIAGNOSIS — J309 Allergic rhinitis, unspecified: Secondary | ICD-10-CM | POA: Diagnosis present

## 2017-08-09 DIAGNOSIS — J9811 Atelectasis: Secondary | ICD-10-CM | POA: Diagnosis present

## 2017-08-09 DIAGNOSIS — K219 Gastro-esophageal reflux disease without esophagitis: Secondary | ICD-10-CM | POA: Diagnosis present

## 2017-08-09 DIAGNOSIS — Z8701 Personal history of pneumonia (recurrent): Secondary | ICD-10-CM | POA: Diagnosis not present

## 2017-08-09 DIAGNOSIS — T380X5A Adverse effect of glucocorticoids and synthetic analogues, initial encounter: Secondary | ICD-10-CM | POA: Diagnosis present

## 2017-08-09 DIAGNOSIS — R0602 Shortness of breath: Secondary | ICD-10-CM

## 2017-08-09 DIAGNOSIS — J4520 Mild intermittent asthma, uncomplicated: Secondary | ICD-10-CM | POA: Diagnosis present

## 2017-08-09 DIAGNOSIS — Z9989 Dependence on other enabling machines and devices: Secondary | ICD-10-CM | POA: Diagnosis not present

## 2017-08-09 DIAGNOSIS — J45901 Unspecified asthma with (acute) exacerbation: Secondary | ICD-10-CM | POA: Diagnosis not present

## 2017-08-09 DIAGNOSIS — R0600 Dyspnea, unspecified: Secondary | ICD-10-CM | POA: Diagnosis not present

## 2017-08-09 DIAGNOSIS — Z7951 Long term (current) use of inhaled steroids: Secondary | ICD-10-CM

## 2017-08-09 DIAGNOSIS — Z79899 Other long term (current) drug therapy: Secondary | ICD-10-CM

## 2017-08-09 DIAGNOSIS — D72829 Elevated white blood cell count, unspecified: Secondary | ICD-10-CM | POA: Diagnosis present

## 2017-08-09 DIAGNOSIS — R0789 Other chest pain: Secondary | ICD-10-CM | POA: Diagnosis not present

## 2017-08-09 HISTORY — DX: Unspecified asthma, uncomplicated: J45.909

## 2017-08-09 LAB — URINALYSIS, ROUTINE W REFLEX MICROSCOPIC
Bilirubin Urine: NEGATIVE
GLUCOSE, UA: NEGATIVE mg/dL
KETONES UR: 20 mg/dL — AB
LEUKOCYTES UA: NEGATIVE
NITRITE: NEGATIVE
Protein, ur: NEGATIVE mg/dL
Specific Gravity, Urine: 1.026 (ref 1.005–1.030)
pH: 5 (ref 5.0–8.0)

## 2017-08-09 LAB — CBC WITH DIFFERENTIAL/PLATELET
Abs Immature Granulocytes: 0.1 10*3/uL (ref 0.0–0.1)
Basophils Absolute: 0.1 10*3/uL (ref 0.0–0.1)
Basophils Relative: 1 %
EOS PCT: 4 %
Eosinophils Absolute: 0.3 10*3/uL (ref 0.0–0.7)
HEMATOCRIT: 39.5 % (ref 39.0–52.0)
HEMOGLOBIN: 14.1 g/dL (ref 13.0–17.0)
Immature Granulocytes: 1 %
LYMPHS ABS: 2.1 10*3/uL (ref 0.7–4.0)
LYMPHS PCT: 26 %
MCH: 32 pg (ref 26.0–34.0)
MCHC: 35.7 g/dL (ref 30.0–36.0)
MCV: 89.8 fL (ref 78.0–100.0)
MONOS PCT: 6 %
Monocytes Absolute: 0.5 10*3/uL (ref 0.1–1.0)
Neutro Abs: 5.3 10*3/uL (ref 1.7–7.7)
Neutrophils Relative %: 64 %
Platelets: 184 10*3/uL (ref 150–400)
RBC: 4.4 MIL/uL (ref 4.22–5.81)
RDW: 18.2 % — ABNORMAL HIGH (ref 11.5–15.5)
WBC: 8.3 10*3/uL (ref 4.0–10.5)

## 2017-08-09 LAB — COMPREHENSIVE METABOLIC PANEL
ALBUMIN: 3.5 g/dL (ref 3.5–5.0)
ALT: 47 U/L — ABNORMAL HIGH (ref 0–44)
AST: 38 U/L (ref 15–41)
Alkaline Phosphatase: 71 U/L (ref 38–126)
Anion gap: 9 (ref 5–15)
BUN: 10 mg/dL (ref 6–20)
CHLORIDE: 105 mmol/L (ref 98–111)
CO2: 26 mmol/L (ref 22–32)
Calcium: 8.7 mg/dL — ABNORMAL LOW (ref 8.9–10.3)
Creatinine, Ser: 0.99 mg/dL (ref 0.61–1.24)
GFR calc Af Amer: >60 mL/min (ref >60)
GLUCOSE: 126 mg/dL — AB (ref 70–99)
POTASSIUM: 3.8 mmol/L (ref 3.5–5.1)
SODIUM: 140 mmol/L (ref 135–145)
Total Bilirubin: 0.9 mg/dL (ref 0.3–1.2)
Total Protein: 6.8 g/dL (ref 6.5–8.1)

## 2017-08-09 LAB — CBC
HCT: 40.7 % (ref 39.0–52.0)
Hemoglobin: 14.4 g/dL (ref 13.0–17.0)
MCH: 31.9 pg (ref 26.0–34.0)
MCHC: 35.4 g/dL (ref 30.0–36.0)
MCV: 90 fL (ref 78.0–100.0)
Platelets: 293 10*3/uL (ref 150–400)
RBC: 4.52 MIL/uL (ref 4.22–5.81)
RDW: 18.4 % — AB (ref 11.5–15.5)
WBC: 9 10*3/uL (ref 4.0–10.5)

## 2017-08-09 LAB — CREATININE, SERUM
Creatinine, Ser: 1.07 mg/dL (ref 0.61–1.24)
GFR calc Af Amer: >60 mL/min (ref >60)
GFR calc non Af Amer: >60 mL/min (ref >60)

## 2017-08-09 LAB — BRAIN NATRIURETIC PEPTIDE: B Natriuretic Peptide: 9.1 pg/mL (ref 0.0–100.0)

## 2017-08-09 LAB — I-STAT CG4 LACTIC ACID, ED: LACTIC ACID, VENOUS: 1.21 mmol/L (ref 0.5–1.9)

## 2017-08-09 LAB — TROPONIN I: Troponin I: <0.03 ng/mL (ref <0.03)

## 2017-08-09 MED ORDER — IPRATROPIUM-ALBUTEROL 0.5-2.5 (3) MG/3ML IN SOLN
3.0000 mL | Freq: Four times a day (QID) | RESPIRATORY_TRACT | Status: DC
  Administered 2017-08-10: 3 mL via RESPIRATORY_TRACT
  Filled 2017-08-09: qty 3

## 2017-08-09 MED ORDER — IPRATROPIUM-ALBUTEROL 0.5-2.5 (3) MG/3ML IN SOLN
3.0000 mL | RESPIRATORY_TRACT | Status: DC
  Administered 2017-08-09: 3 mL via RESPIRATORY_TRACT
  Filled 2017-08-09: qty 3

## 2017-08-09 MED ORDER — ONDANSETRON HCL 4 MG PO TABS: 4.0000 mg | ORAL_TABLET | Freq: Four times a day (QID) | ORAL | Status: DC | PRN

## 2017-08-09 MED ORDER — METHYLPREDNISOLONE SODIUM SUCC 40 MG IJ SOLR
40.0000 mg | Freq: Every day | INTRAMUSCULAR | Status: DC
  Administered 2017-08-10 – 2017-08-11 (×2): 40 mg via INTRAVENOUS
  Filled 2017-08-09 (×2): qty 1

## 2017-08-09 MED ORDER — IPRATROPIUM-ALBUTEROL 0.5-2.5 (3) MG/3ML IN SOLN: 3.0000 mL | RESPIRATORY_TRACT | Status: DC | PRN

## 2017-08-09 MED ORDER — BUDESONIDE 0.25 MG/2ML IN SUSP
0.2500 mg | Freq: Two times a day (BID) | RESPIRATORY_TRACT | Status: DC
  Administered 2017-08-09 – 2017-08-11 (×4): 0.25 mg via RESPIRATORY_TRACT
  Filled 2017-08-09 (×4): qty 2

## 2017-08-09 MED ORDER — PANTOPRAZOLE SODIUM 40 MG PO TBEC
40.0000 mg | DELAYED_RELEASE_TABLET | Freq: Every day | ORAL | Status: DC
  Administered 2017-08-10 – 2017-08-11 (×2): 40 mg via ORAL
  Filled 2017-08-09 (×2): qty 1

## 2017-08-09 MED ORDER — SODIUM CHLORIDE 0.9% FLUSH
3.0000 mL | Freq: Two times a day (BID) | INTRAVENOUS | Status: DC
  Administered 2017-08-09 – 2017-08-11 (×5): 3 mL via INTRAVENOUS

## 2017-08-09 MED ORDER — FLUTICASONE PROPIONATE 50 MCG/ACT NA SUSP
2.0000 | Freq: Every day | NASAL | Status: DC
  Administered 2017-08-09 – 2017-08-11 (×3): 2 via NASAL
  Filled 2017-08-09: qty 16

## 2017-08-09 MED ORDER — ALBUTEROL (5 MG/ML) CONTINUOUS INHALATION SOLN
10.0000 mg/h | INHALATION_SOLUTION | RESPIRATORY_TRACT | Status: DC
  Administered 2017-08-09: 10 mg/h via RESPIRATORY_TRACT
  Filled 2017-08-09: qty 20

## 2017-08-09 MED ORDER — ENOXAPARIN SODIUM 40 MG/0.4ML ~~LOC~~ SOLN
40.0000 mg | SUBCUTANEOUS | Status: DC
  Administered 2017-08-09: 40 mg via SUBCUTANEOUS
  Filled 2017-08-09: qty 0.4

## 2017-08-09 MED ORDER — SODIUM CHLORIDE 0.9 % IV SOLN
250.0000 mL | INTRAVENOUS | Status: DC | PRN
Start: 2017-08-09 — End: 2017-08-11

## 2017-08-09 MED ORDER — GUAIFENESIN-DM 100-10 MG/5ML PO SYRP
5.0000 mL | ORAL_SOLUTION | Freq: Four times a day (QID) | ORAL | Status: DC
  Administered 2017-08-09 – 2017-08-10 (×3): 5 mL via ORAL
  Filled 2017-08-09 (×3): qty 5

## 2017-08-09 MED ORDER — IBUPROFEN 200 MG PO TABS: 200.0000 mg | ORAL_TABLET | Freq: Four times a day (QID) | ORAL | Status: DC | PRN

## 2017-08-09 MED ORDER — METHYLPREDNISOLONE SODIUM SUCC 125 MG IJ SOLR
125.0000 mg | Freq: Once | INTRAMUSCULAR | Status: AC
  Administered 2017-08-09: 125 mg via INTRAVENOUS
  Filled 2017-08-09: qty 2

## 2017-08-09 MED ORDER — ONDANSETRON HCL 4 MG/2ML IJ SOLN: 4.0000 mg | Freq: Four times a day (QID) | INTRAMUSCULAR | Status: DC | PRN

## 2017-08-09 MED ORDER — ACETAMINOPHEN 500 MG PO TABS
500.0000 mg | ORAL_TABLET | Freq: Four times a day (QID) | ORAL | Status: DC | PRN
  Administered 2017-08-09: 500 mg via ORAL
  Filled 2017-08-09: qty 1

## 2017-08-09 MED ORDER — SODIUM CHLORIDE 0.9% FLUSH: 3.0000 mL | INTRAVENOUS | Status: DC | PRN

## 2017-08-09 MED ORDER — ENOXAPARIN SODIUM 100 MG/ML ~~LOC~~ SOLN
90.0000 mg | SUBCUTANEOUS | Status: DC
  Administered 2017-08-10: 90 mg via SUBCUTANEOUS
  Filled 2017-08-09: qty 1

## 2017-08-09 MED ORDER — LORATADINE 10 MG PO TABS: 10.0000 mg | ORAL_TABLET | Freq: Every day | ORAL | Status: DC | PRN

## 2017-08-09 MED ORDER — OMEPRAZOLE MAGNESIUM 20 MG PO TBEC: 20.0000 mg | DELAYED_RELEASE_TABLET | ORAL | Status: DC

## 2017-08-09 NOTE — ED Notes (Signed)
Pt oxygen level again dropped to 88% on 4L [place]. Pt oxygen increased to 6L  and MD made aware.

## 2017-08-09 NOTE — ED Provider Notes (Signed)
[facility] EMERGENCY DEPARTMENT Provider Note   CSN: [id number] Arrival date & time: 08/09/17  1036     History   Chief Complaint Chief Complaint  Patient presents with  . Pneumonia    HPI Aaron Frost is a 37 y.o. male.  HPI Patient presents with 1 month of progressive upper respiratory illness.  States he has had intermittent fevers and chills.  Productive cough early in the month and has now become dry hacking cough.  Endorses some chest tightness and difficulty breathing.  Finished course of Levaquin and prednisone last week.  Was evaluated by his primary physician today and advised to come to the emergency department due to hypoxia.  Patient denies any new lower extremity swelling or pain. Past Medical History:  Diagnosis Date  . Asthma 08/09/2017  . OSA on CPAP   . Umbilical hernia 5329    Patient Active Problem List   Diagnosis Date Noted  . Asthma 08/09/2017  . BMI 50.0-59.9, adult ([place]) 04/05/2016  . Hemorrhoids 04/05/2016  . Essential hypertension 04/05/2016  . Gastroesophageal reflux disease 04/05/2016  . Ventral hernia 12/24/2012  . Pain of left heel 11/24/2011  . OSA on CPAP 10/08/2011  . Leg swelling 10/07/2011  . Radicular pain in left arm 10/07/2011  . Obesity, unspecified 06/10/2007  . DEPRESSION 06/10/2007  . ALLERGIC RHINITIS 06/10/2007    Past Surgical History:  Procedure Laterality Date  . EXTERNAL EAR SURGERY     right ear reconstruction  . MOLE REMOVAL     [place] of [place]ck  . TONSILLECTOMY  2005        Home Medications    Prior to Admission medications   Medication Sig Start Date End Date Taking? Authorizing Provider  acetaminophen (TYLENOL) 500 MG tablet Take 500 mg by mouth every 6 (six) hours as needed.    [provider]  albuterol (PROVENTIL HFA;VENTOLIN HFA) 108 (90 Base) MCG/ACT inhaler Inhale 2 puffs into the lungs every 6 (six) hours as needed for wheezing or shortness of breath. 07/31/17   Orma Flaming, MD  amLODipine (NORVASC) 5 MG tablet Take 1 tablet (5 mg total) by mouth daily. 04/19/16   Inda Coke, PA  Ascorbic Acid (VITA-C PO) Take 1 packet by mouth 2 (two) times daily.    [provider]  benzonatate (TESSALON) 100 MG capsule Take 1 capsule (100 mg total) by mouth 2 (two) times daily as needed for cough. 07/31/17   Orma Flaming, MD  dextromethorphan-guaiFENesin [facility] DM) 30-600 MG 12hr tablet Take 1 tablet by mouth 2 (two) times daily.    [provider]  fluticasone (FLONASE) 50 MCG/ACT nasal spray Place 2 sprays into both nostrils daily. 07/24/17   Orma Flaming, MD  ibuprofen (ADVIL,MOTRIN) 200 MG tablet Take 200 mg by mouth every 6 (six) hours as needed.    [provider]  loratadine (CLARITIN) 10 MG tablet Take 10 mg by mouth daily as needed.     [provider]  omeprazole (PRILOSEC OTC) 20 MG tablet Take 20 mg by mouth as needed.     [provider]  hydrochlorothiazide 25 MG tablet Take 1 tablet (25 mg total) by mouth daily. 07/14/10 03/16/11  Norins, Heinz Knuckles, MD    Family History Family History  Problem Relation Age of Onset  . Colon cancer Paternal Grandfather 61  . Heart disease Paternal Grandfather   . Stroke Paternal Grandfather   . Heart disease Father   . Hypertension Mother  grandfather/uncles  . Diabetes Maternal Grandfather   . Cancer Sister        male  . Cancer Paternal Grandmother 49       stomach  . Breast cancer Neg Hx   . Prostate cancer Neg Hx     Social History Social History   Tobacco Use  . Smoking status: Former Smoker    Packs/day: 1.50    Years: 3.00    Pack years: 4.50    Types: Cigarettes    Last attempt to quit: 01/09/2004    Years since quitting: 13.5  . Smokeless tobacco: Never Used  Substance Use Topics  . Alcohol use: Yes    Comment: Occassional glass of wine  . Drug use: No     Allergies   Aleve [naproxen] and Nickel   Review of Systems Review of  Systems  Constitutional: Positive for chills, fatigue and fever.  HENT: Positive for congestion, postnasal drip and sore throat.   Eyes: Negative for visual disturbance.  Respiratory: Positive for cough, chest tightness, shortness of breath and wheezing.   Cardiovascular: Negative for chest pain and leg swelling.  Gastrointestinal: Negative for abdominal pain, constipation, diarrhea, nausea and vomiting.  Genitourinary: Negative for dysuria, flank pain and frequency.  Musculoskeletal: Negative for back pain, gait problem, myalgias, neck pain and neck stiffness.  Skin: Negative for rash and wound.  Neurological: Negative for dizziness, weakness, light-headedness and headaches.  All other systems reviewed and are negative.    Physical Exam Updated Vital Signs BP (!) 141/66   Pulse (!) 105   Temp 99 F (37.2 C) (Oral)   Resp (!) 21   SpO2 90%   Physical Exam  Constitutional: He is oriented to person, place, and time. He appears well-developed and well-nourished. No distress.  HENT:  Head: Normocephalic and atraumatic.  Mouth/Throat: Oropharynx is clear and moist. No oropharyngeal exudate.  Bilateral nasal mucosal edema.  Oropharynx is mildly erythematous.  Eyes: Pupils are equal, round, and reactive to light. EOM are normal.  Neck: Normal range of motion. Neck supple. No JVD present. No tracheal deviation present. No thyromegaly present.  Cardiovascular: Normal rate and regular rhythm. Exam reveals no gallop and no friction rub.  No murmur heard. Pulmonary/Chest: Effort normal.  Expiratory wheezing throughout.  Diminished breath sounds in bilateral bases.  Abdominal: Soft. Bowel sounds are normal. There is no tenderness. There is no rebound and no guarding.  Musculoskeletal: Normal range of motion. He exhibits no edema or tenderness.  No lower extremity swelling, asymmetry or tenderness.  Lymphadenopathy:    He has no cervical adenopathy.  Neurological: He is alert and oriented  to person, place, and time.  Moves all extremities without focal deficit.  Sensation fully intact.  Skin: Skin is warm and dry. Capillary refill takes less than 2 seconds. No rash noted. He is not diaphoretic. No erythema.  Psychiatric: He has a normal mood and affect. His behavior is normal.  Nursing note and vitals reviewed.    ED Treatments / Results  Labs (all labs ordered are listed, but only abnormal results are displayed) Labs Reviewed  COMPREHENSIVE METABOLIC PANEL - Abnormal; Notable for the following components:      Result Value   Glucose, Bld 126 (*)    Calcium 8.7 (*)    ALT 47 (*)    All other components within normal limits  CBC WITH DIFFERENTIAL/PLATELET - Abnormal; Notable for the following components:   RDW 18.2 (*)    All other components  within normal limits  BRAIN NATRIURETIC PEPTIDE  TROPONIN I  URINALYSIS, ROUTINE W REFLEX MICROSCOPIC  I-STAT CG4 LACTIC ACID, ED  I-STAT CG4 LACTIC ACID, ED    EKG EKG Interpretation  Date/Time:  Friday August 09 2017 10:43:48 EDT Ventricular Rate:  105 PR Interval:  122 QRS Duration: 88 QT Interval:  352 QTC Calculation: 465 R Axis:   47 Text Interpretation:  Sinus tachycardia Otherwise normal ECG Confirmed by Julianne Rice [phone]) on 08/09/2017 2:24:42 PM   Radiology Dg Chest 2 View  Result Date: 08/09/2017 CLINICAL DATA:  Short of breath EXAM: CHEST - 2 VIEW COMPARISON:  07/31/2017 FINDINGS: The heart size and mediastinal contours are within normal limits. Both lungs are clear. The visualized skeletal structures are unremarkable. IMPRESSION: No active cardiopulmonary disease. Electronically Signed   By: Franchot Gallo M.D.   On: 08/09/2017 11:16    Procedures Procedures (including critical care time)  Medications Ordered in ED Medications  albuterol (PROVENTIL,VENTOLIN) solution continuous neb (0 mg/hr Nebulization Stopped 08/09/17 1444)  methylPREDNISolone sodium succinate (SOLU-MEDROL) 125 mg/2 mL injection  125 mg (125 mg Intravenous Given 08/09/17 1247)   CRITICAL CARE Performed by: Julianne Rice Total critical care time: 25 minutes Critical care time was exclusive of separately billable procedures and treating other patients. Critical care was necessary to treat or prevent imminent or life-threatening deterioration. Critical care was time spent personally by me on the following activities: development of treatment plan with patient and/or surrogate as well as nursing, discussions with consultants, evaluation of patient's response to treatment, examination of patient, obtaining history from patient or surrogate, ordering and performing treatments and interventions, ordering and review of laboratory studies, ordering and review of radiographic studies, pulse oximetry and re-evaluation of patient's condition.  Initial Impression / Assessment and Plan / ED Course  I have reviewed the triage vital signs and the nursing notes.  Pertinent labs & imaging results that were available during my care of the patient were reviewed by me and considered in my medical decision making (see chart for details).     Patient continues to have oxygen requirement despite continuous hour-long nebulized treatment and Solu-Medrol.  Mildly improved air movement.  Discussed with hospitalist who will see patient in the emergency department and admit.  Patient has a normal white blood cell count and no evidence of pneumonia on x-ray.  Final Clinical Impressions(s) / ED Diagnoses   Final diagnoses:  Bronchospasm  Hypoxia    ED Discharge Orders    None       Julianne Rice, MD 08/09/17 [phone]

## 2017-08-09 NOTE — H&P (Addendum)
- History and Physical    Aaron Frost CBJ:[id number] DOB: 09/21/1980 DOA: 08/09/2017  PCP: Inda Coke, PA   Patient coming from: Home   Chief Complaint: Dyspnea   HPI: Aaron Frost is a 36 y.o. male with medical history significant of hypertension, obstructive sleep apnea, obesity and seasonal allergies.  He reports worsening dyspnea for the last 3 weeks.  His symptoms started with an upper respiratory tract infection with postnasal dripping, dry cough and fevers. Positive sick contact at work.  4 days after his symptoms started he was seen by his primary care physician who prescribed antibiotic therapy with no significant improvement, 7 days later his antibiotics were changed to levofloxacin, added albuterol inhaler and Medrol Dosepak.  He was seen today in the office with worsening dyspnea, and he was referred to the emergency room for further evaluation.  His dyspnea has been moderate to severe in intensity, worse with exertion, associated with dry cough and wheezing, improved with albuterol.  No further fevers.    ED Course: Patient was found hypoxic, dyspneic, severe wheezing, refractive to initial therapy in the emergency department.  Review of Systems:  1. General: positive fevers, no chills, no weight gain or weight loss 2. ENT: positive runny nose but no sore throat, no hearing disturbances 3. Pulmonary: positive dyspnea, cough, wheezing,but no hemoptysis 4. Cardiovascular: No angina, claudication, lower extremity edema, pnd or orthopnea 5. Gastrointestinal: No nausea or vomiting, no diarrhea or constipation 6. Hematology: No easy bruisability or frequent infections 7. Urology: No dysuria, hematuria or increased urinary frequency 8. Dermatology: No rashes. 9. Neurology: No seizures or paresthesias 10. Musculoskeletal: No joint pain or deformities  Past Medical History:  Diagnosis Date  . Asthma 08/09/2017  . OSA on CPAP   . Umbilical hernia 1607    Past  Surgical History:  Procedure Laterality Date  . EXTERNAL EAR SURGERY     right ear reconstruction  . MOLE REMOVAL     Baco of Neck  . TONSILLECTOMY  2005     reports that he quit smoking about 13 years ago. His smoking use included cigarettes. He has a 4.50 pack-year smoking history. He has never used smokeless tobacco. He reports that he drinks alcohol. He reports that he does not use drugs.  Allergies  Allergen Reactions  . Aleve [Naproxen]     Burning sensation and headaches  . Nickel Rash    Family History  Problem Relation Age of Onset  . Colon cancer Paternal Grandfather 74  . Heart disease Paternal Grandfather   . Stroke Paternal Grandfather   . Heart disease Father   . Hypertension Mother        grandfather/uncles  . Diabetes Maternal Grandfather   . Cancer Sister        male  . Cancer Paternal Grandmother 38       stomach  . Breast cancer Neg Hx   . Prostate cancer Neg Hx      Prior to Admission medications   Medication Sig Start Date End Date Taking? Authorizing Provider  acetaminophen (TYLENOL) 500 MG tablet Take 500 mg by mouth every 6 (six) hours as needed.    [provider]  albuterol (PROVENTIL HFA;VENTOLIN HFA) 108 (90 Base) MCG/ACT inhaler Inhale 2 puffs into the lungs every 6 (six) hours as needed for wheezing or shortness of breath. 07/31/17   Orma Flaming, MD  amLODipine (NORVASC) 5 MG tablet Take 1 tablet (5 mg total) by mouth daily. 04/19/16   Inda Coke,  PA  Ascorbic Acid (VITA-C PO) Take 1 packet by mouth 2 (two) times daily.    [provider]  benzonatate (TESSALON) 100 MG capsule Take 1 capsule (100 mg total) by mouth 2 (two) times daily as needed for cough. 07/31/17   Orma Flaming, MD  dextromethorphan-guaiFENesin [facility] DM) 30-600 MG 12hr tablet Take 1 tablet by mouth 2 (two) times daily.    [provider]  fluticasone (FLONASE) 50 MCG/ACT nasal spray Place 2 sprays into both nostrils daily. 07/24/17    Orma Flaming, MD  ibuprofen (ADVIL,MOTRIN) 200 MG tablet Take 200 mg by mouth every 6 (six) hours as needed.    [provider]  loratadine (CLARITIN) 10 MG tablet Take 10 mg by mouth daily as needed.     [provider]  omeprazole (PRILOSEC OTC) 20 MG tablet Take 20 mg by mouth as needed.     [provider]  hydrochlorothiazide 25 MG tablet Take 1 tablet (25 mg total) by mouth daily. 07/14/10 03/16/11  Neena Rhymes, MD    Physical Exam: Vitals:   08/09/17 1315 08/09/17 1400 08/09/17 1430 08/09/17 1445  BP: (!) 148/66 (!) 172/91 (!) 170/65 (!) 141/66  Pulse: 76 92 (!) 104 (!) 105  Resp: 20  (!) 23 (!) 21  Temp:      TempSrc:      SpO2: 99% 95% 92% 90%    Constitutional: dyspneic and ill looking appearing  Vitals:   08/09/17 1315 08/09/17 1400 08/09/17 1430 08/09/17 1445  BP: (!) 148/66 (!) 172/91 (!) 170/65 (!) 141/66  Pulse: 76 92 (!) 104 (!) 105  Resp: 20  (!) 23 (!) 21  Temp:      TempSrc:      SpO2: 99% 95% 92% 90%   Eyes: PERRL, lids and conjunctivae normal Head normocephalic, nose and ears with no deformities ENMT: Mucous membranes are moist. Posterior pharynx clear of any exudate or lesions.Normal dentition.  Neck: normal, supple, no masses, no thyromegaly/ wide with no accessory muscle use.  Respiratory: decreased breath sounds on auscultation bilaterally, positive expiratory wheezing, with no crackles.  Cardiovascular: Regular rate and rhythm, no murmurs / rubs / gallops. No extremity edema. 2+ pedal pulses. No carotid bruits.  Abdomen: protuberant, with no tenderness, no masses palpated. No hepatosplenomegaly. Bowel sounds positive.  Musculoskeletal: no clubbing / cyanosis. No joint deformity upper and lower extremities. Good ROM, no contractures. Normal muscle tone.  Skin: no rashes, lesions, ulcers. No induration Neurologic: CN 2-12 grossly intact. Sensation intact, DTR normal. Strength 5/5 in all 4.     Labs on Admission: I have  personally reviewed following labs and imaging studies  CBC: Recent Labs  Lab 08/09/17 1055  WBC 8.3  NEUTROABS 5.3  HGB 14.1  HCT 39.5  MCV 89.8  PLT 914   Basic Metabolic Panel: Recent Labs  Lab 08/09/17 1055  NA 140  K 3.8  CL 105  CO2 26  GLUCOSE 126*  BUN 10  CREATININE 0.99  CALCIUM 8.7*   GFR: Estimated Creatinine Clearance: 175.2 mL/min (by C-G formula based on SCr of 0.99 mg/dL). Liver Function Tests: Recent Labs  Lab 08/09/17 1055  AST 38  ALT 47*  ALKPHOS 71  BILITOT 0.9  PROT 6.8  ALBUMIN 3.5   No results for input(s): LIPASE, AMYLASE in the last 168 hours. No results for input(s): AMMONIA in the last 168 hours. Coagulation Profile: No results for input(s): INR, PROTIME in the last 168 hours. Cardiac Enzymes: Recent  Labs  Lab 08/09/17 1229  TROPONINI <0.03   BNP (last 3 results) No results for input(s): PROBNP in the last 8760 hours. HbA1C: No results for input(s): HGBA1C in the last 72 hours. CBG: No results for input(s): GLUCAP in the last 168 hours. Lipid Profile: No results for input(s): CHOL, HDL, LDLCALC, TRIG, CHOLHDL, LDLDIRECT in the last 72 hours. Thyroid Function Tests: No results for input(s): TSH, T4TOTAL, FREET4, T3FREE, THYROIDAB in the last 72 hours. Anemia Panel: No results for input(s): VITAMINB12, FOLATE, FERRITIN, TIBC, IRON, RETICCTPCT in the last 72 hours. Urine analysis:    Component Value Date/Time   COLORURINE YELLOW 05/06/2014 0936   APPEARANCEUR CLEAR 05/06/2014 0936   LABSPEC 1.020 05/06/2014 0936   PHURINE 6.0 05/06/2014 0936   GLUCOSEU NEGATIVE 05/06/2014 0936   HGBUR TRACE-INTACT (A) 05/06/2014 0936   BILIRUBINUR NEGATIVE 05/06/2014 0936   KETONESUR NEGATIVE 05/06/2014 0936   PROTEINUR NEG 03/16/2011 1039   UROBILINOGEN 0.2 05/06/2014 0936   NITRITE NEGATIVE 05/06/2014 0936   LEUKOCYTESUR NEGATIVE 05/06/2014 0936    Radiological Exams on Admission: Dg Chest 2 View  Result Date:  08/09/2017 CLINICAL DATA:  Short of breath EXAM: CHEST - 2 VIEW COMPARISON:  07/31/2017 FINDINGS: The heart size and mediastinal contours are within normal limits. Both lungs are clear. The visualized skeletal structures are unremarkable. IMPRESSION: No active cardiopulmonary disease. Electronically Signed   By: Franchot Gallo M.D.   On: 08/09/2017 11:16    EKG: Independently reviewed.  EKG normal sinus rhythm, normal axis, normal intervals.  Assessment/Plan Active Problems:   Asthma  37 year old gentleman who presents with worsening dyspnea, wheezing, cough and decrease physical functional capacity.  His symptoms started about 3 weeks ago with a viral illness that progressed into signs of bronchoconstriction that has been refractive to outpatient therapy with antibiotics, steroids and bronchodilators.  Initial physical examination blood pressure 172/91, heart rate 104, respiratory rate 26, oxygen saturation 89% on room air.  No accessory muscle use, but he does have significant expiratory wheezing and decreased air movement, heart S1-S2 present and rhythmic, abdomen protuberant, no lower extremity edema. Na 140, potassium 3.8, chloride 105, bicarb 26, glucose 126, BUN 10, creatinine 0.99, white count 8.3, hemoglobin 14.1, hematocrit 39.5, platelets 184.  Chest x-ray right rotation, hyperinflation, bibasilar atelectasis.  No infiltrates.  Patient will be admitted to the hospital with working diagnosis of hyperreactive airway disease likely asthma exacerbation complicated by hypoxic respiratory failure  1.  Acute asthma exacerbation complicated by hypoxic respiratory failure.  Patient will be admitted to the medical ward, he will be placed on a remote telemetry monitor, continue aggressive bronchodilator therapy with DuoNeb every 4 hours and as needed every 2 hours, systemic steroids with methylprednisolone 40 mg daily, and inhaled corticosteroids.  Monitor peak flow meter pre-and post bronchodilator.   Cough suppressive therapy with guaifenesin DM every 6 hours. Oximetry monitoring and supplemental oxygen per nasal cannula, target oxygen saturation greater than 92%.  2.  Hypertension.  Will resume antihypertensive therapy with amlodipine 5 mg daily.  At home patient has been off antihypertensive agents.  3.  Obstructive sleep apnea.  Resume CPAP at night with home settings.  4.  Morbid obesity.  Will have patient out of bed as tolerated, need outpatient follow-up. Calculated BMI 55.3  DVT prophylaxis: enoxaparin  Code Status:  full  Family Communication: I spoke with patient's family at the bedside and all questions were addressed.   Disposition Plan:  Med-surg with remote telemetry.  Consults called:  Admission status: Inpatient     Jensyn Shave Gerome Apley MD [facility] Pager [phone]  If 7PM-7AM, please contact night-coverage [email or web address] Password [email or web address]  08/09/2017, 3:09 PM

## 2017-08-09 NOTE — ED Triage Notes (Signed)
Pt in c/o worsening pneumonia, being treated outpatient and has had two rounds of antibiotics without improvement, O2 level in office was decreased so patient sent here, speaking in short phrases in triage, reports shortness of breath with exertion

## 2017-08-09 NOTE — ED Notes (Signed)
RT aware of Cont Neb

## 2017-08-09 NOTE — ED Notes (Signed)
Pt placed on 2L via [place] in triage

## 2017-08-09 NOTE — ED Notes (Signed)
Pt oxygen level fell to 87% when changing from the aerosol mask to [place]. Pt placed back on 4L [place] with slow improvement to 92%.

## 2017-08-09 NOTE — Progress Notes (Signed)
Aaron Frost is a 37 y.o. male is here to discuss: Cough  I acted as a Education administrator for [employer], PA-C Anselmo Pickler, LPN  History of Present Illness:   Chief Complaint  Patient presents with  . Cough    Cough  This is a recurrent (Pt still c/o cough and still not being able to breath) problem. The problem has been waxing and waning. Cough characteristics: expectorating white/light yellow. Associated symptoms include headaches, nasal congestion, postnasal drip, shortness of breath and wheezing. Pertinent negatives include no chills or fever. The symptoms are aggravated by lying down and exercise. He has tried oral steroids, rest and steroid inhaler (Pt is wearing Cpap at night) for the symptoms. The treatment provided mild relief. His past medical history is significant for bronchitis, environmental allergies and pneumonia.   Finished the Prednisone and Levaquin from his visit from 07/31/17. He states that his breathing has not been improving. Remains SOB.  Has been using albuterol without relief.  Does have a history of tobacco abuse. Smoked intermittently since college, stopped in 2015. Has had a CPAP for >5 years but thinks that he needs different settings or new device, not sure that it is working.  He has been working from home. Denies prior history of blood clots. Denies lower leg swelling, erythema, pain.  There are no preventive care reminders to display for this patient.  Past Medical History:  Diagnosis Date  . OSA on CPAP   . Umbilical hernia 8413     Social History   Socioeconomic History  . Marital status: Legally Separated    Spouse name: Not on file  . Number of children: Not on file  . Years of education: Not on file  . Highest education level: Not on file  Occupational History  . Occupation: Research scientist (physical sciences): Sports coach  Social Needs  . Financial resource strain: Not on file  . Food insecurity:    Worry: Not on file    Inability: Not  on file  . Transportation needs:    Medical: Not on file    Non-medical: Not on file  Tobacco Use  . Smoking status: Former Smoker    Packs/day: 1.50    Years: 3.00    Pack years: 4.50    Types: Cigarettes    Last attempt to quit: 01/09/2004    Years since quitting: 13.5  . Smokeless tobacco: Never Used  Substance and Sexual Activity  . Alcohol use: Yes    Comment: Occassional glass of wine  . Drug use: No  . Sexual activity: Never  Lifestyle  . Physical activity:    Days per week: Not on file    Minutes per session: Not on file  . Stress: Not on file  Relationships  . Social connections:    Talks on phone: Not on file    Gets together: Not on file    Attends religious service: Not on file    Active member of club or organization: Not on file    Attends meetings of clubs or organizations: Not on file    Relationship status: Not on file  . Intimate partner violence:    Fear of current or ex partner: Not on file    Emotionally abused: Not on file    Physically abused: Not on file    Forced sexual activity: Not on file  Other Topics Concern  . Not on file  Social History Narrative   Work for [employer] --  sedentary   Divorced, Dec 2015    Live in [place]: walk, watch TV    Past Surgical History:  Procedure Laterality Date  . EXTERNAL EAR SURGERY     right ear reconstruction  . MOLE REMOVAL     Baco of Neck  . TONSILLECTOMY  2005    Family History  Problem Relation Age of Onset  . Colon cancer Paternal Grandfather 52  . Heart disease Paternal Grandfather   . Stroke Paternal Grandfather   . Heart disease Father   . Hypertension Mother        grandfather/uncles  . Diabetes Maternal Grandfather   . Cancer Sister        male  . Cancer Paternal Grandmother 53       stomach  . Breast cancer Neg Hx   . Prostate cancer Neg Hx     PMHx, SurgHx, SocialHx, FamHx, Medications, and Allergies were reviewed in the Visit Navigator and updated as  appropriate.   Patient Active Problem List   Diagnosis Date Noted  . BMI 50.0-59.9, adult ([place]) 04/05/2016  . Hemorrhoids 04/05/2016  . Essential hypertension 04/05/2016  . Gastroesophageal reflux disease 04/05/2016  . Ventral hernia 12/24/2012  . Pain of left heel 11/24/2011  . OSA on CPAP 10/08/2011  . Leg swelling 10/07/2011  . Radicular pain in left arm 10/07/2011  . Obesity, unspecified 06/10/2007  . DEPRESSION 06/10/2007  . ALLERGIC RHINITIS 06/10/2007    Social History   Tobacco Use  . Smoking status: Former Smoker    Packs/day: 1.50    Years: 3.00    Pack years: 4.50    Types: Cigarettes    Last attempt to quit: 01/09/2004    Years since quitting: 13.5  . Smokeless tobacco: Never Used  Substance Use Topics  . Alcohol use: Yes    Comment: Occassional glass of wine  . Drug use: No    Current Medications and Allergies:    Current Outpatient Medications:  .  acetaminophen (TYLENOL) 500 MG tablet, Take 500 mg by mouth every 6 (six) hours as needed., Disp: , Rfl:  .  albuterol (PROVENTIL HFA;VENTOLIN HFA) 108 (90 Base) MCG/ACT inhaler, Inhale 2 puffs into the lungs every 6 (six) hours as needed for wheezing or shortness of breath., Disp: 1 Inhaler, Rfl: 0 .  amLODipine (NORVASC) 5 MG tablet, Take 1 tablet (5 mg total) by mouth daily., Disp: 90 tablet, Rfl: 0 .  Ascorbic Acid (VITA-C PO), Take 1 packet by mouth 2 (two) times daily., Disp: , Rfl:  .  benzonatate (TESSALON) 100 MG capsule, Take 1 capsule (100 mg total) by mouth 2 (two) times daily as needed for cough., Disp: 20 capsule, Rfl: 0 .  dextromethorphan-guaiFENesin (MUCINEX DM) 30-600 MG 12hr tablet, Take 1 tablet by mouth 2 (two) times daily., Disp: , Rfl:  .  fluticasone (FLONASE) 50 MCG/ACT nasal spray, Place 2 sprays into both nostrils daily., Disp: 16 g, Rfl: 6 .  ibuprofen (ADVIL,MOTRIN) 200 MG tablet, Take 200 mg by mouth every 6 (six) hours as needed., Disp: , Rfl:  .  loratadine (CLARITIN) 10 MG tablet,  Take 10 mg by mouth daily as needed. , Disp: , Rfl:  .  omeprazole (PRILOSEC OTC) 20 MG tablet, Take 20 mg by mouth as needed. , Disp: , Rfl:    Allergies  Allergen Reactions  . Aleve [Naproxen]     Burning sensation and headaches  . Nickel Rash    Review of Systems  Review of Systems  Constitutional: Negative for chills and fever.  HENT: Positive for postnasal drip.   Respiratory: Positive for cough, shortness of breath and wheezing.   Neurological: Positive for headaches.  Endo/Heme/Allergies: Positive for environmental allergies.    Vitals:   Vitals:   08/09/17 0820  BP: 124/80  Pulse: 82  Temp: 99.1 F (37.3 C)  TempSrc: Oral  SpO2: 91%  Weight: (!) 414 lb (187.8 kg)  Height: 6' 0.5" (1.842 m)     Body mass index is 55.38 kg/m.   Physical Exam:    Physical Exam  Constitutional: He appears well-developed. He is cooperative.  Non-toxic appearance. He does not have a sickly appearance. He does not appear ill. No distress.  HENT:  Head: Normocephalic and atraumatic.  Right Ear: Tympanic membrane, external ear and ear canal normal. Tympanic membrane is not erythematous, not retracted and not bulging.  Left Ear: Tympanic membrane, external ear and ear canal normal. Tympanic membrane is not erythematous, not retracted and not bulging.  Nose: Nose normal. Right sinus exhibits no maxillary sinus tenderness and no frontal sinus tenderness. Left sinus exhibits no maxillary sinus tenderness and no frontal sinus tenderness.  Mouth/Throat: Uvula is midline. No posterior oropharyngeal edema or posterior oropharyngeal erythema.  Eyes: Conjunctivae and lids are normal.  Neck: Trachea normal.  Cardiovascular: Normal rate, regular rhythm, S1 normal, S2 normal, normal heart sounds and normal pulses.  No LE edema  Pulmonary/Chest: Effort normal. He has decreased breath sounds in the right upper field, the right middle field and the right lower field. He has wheezes in the right  upper field, the right middle field and the right lower field. He has rhonchi in the right upper field, the right middle field and the right lower field. He has rales in the right upper field, the right middle field and the right lower field.  Lymphadenopathy:    He has no cervical adenopathy.  Neurological: He is alert. GCS eye subscore is 4. GCS verbal subscore is 5. GCS motor subscore is 6.  Skin: Skin is warm, dry and intact.  Psychiatric: He has a normal mood and affect. His speech is normal and behavior is normal.  Nursing note and vitals reviewed.     Assessment and Plan:    Aaron Frost was seen today for cough.  Diagnoses and all orders for this visit:  Pneumonia due to organism  OSA on CPAP   He has failed outpatient treatment. Discussed with patient that he needs to go to the ER. Patient verbalized understanding of plan.  I am also going to put in a referral to pulmonary to review his need for CPAP and make recommendations as indicated.  . Reviewed expectations re: course of current medical issues. . Discussed self-management of symptoms. . Outlined signs and symptoms indicating need for more acute intervention. . Patient verbalized understanding and all questions were answered. . See orders for this visit as documented in the electronic medical record. . Patient received an After Visit Summary.  CMA or LPN served as scribe during this visit. History, Physical, and Plan performed by medical provider. Documentation and orders reviewed and attested to.   Inda Coke, PA-C [place] [place] 08/09/2017  Follow-up: No follow-ups on file.

## 2017-08-09 NOTE — ED Notes (Signed)
Admitting MD at bedside.

## 2017-08-09 NOTE — ED Notes (Signed)
ED Provider at bedside. 

## 2017-08-10 DIAGNOSIS — Z9989 Dependence on other enabling machines and devices: Secondary | ICD-10-CM

## 2017-08-10 DIAGNOSIS — J302 Other seasonal allergic rhinitis: Secondary | ICD-10-CM

## 2017-08-10 DIAGNOSIS — Z6841 Body Mass Index (BMI) 40.0 and over, adult: Secondary | ICD-10-CM

## 2017-08-10 DIAGNOSIS — J45901 Unspecified asthma with (acute) exacerbation: Principal | ICD-10-CM

## 2017-08-10 DIAGNOSIS — G4733 Obstructive sleep apnea (adult) (pediatric): Secondary | ICD-10-CM

## 2017-08-10 DIAGNOSIS — R739 Hyperglycemia, unspecified: Secondary | ICD-10-CM

## 2017-08-10 DIAGNOSIS — I10 Essential (primary) hypertension: Secondary | ICD-10-CM

## 2017-08-10 DIAGNOSIS — D649 Anemia, unspecified: Secondary | ICD-10-CM

## 2017-08-10 DIAGNOSIS — K219 Gastro-esophageal reflux disease without esophagitis: Secondary | ICD-10-CM

## 2017-08-10 LAB — BASIC METABOLIC PANEL
ANION GAP: 9 (ref 5–15)
BUN: 12 mg/dL (ref 6–20)
CALCIUM: 8.9 mg/dL (ref 8.9–10.3)
CO2: 25 mmol/L (ref 22–32)
CREATININE: 0.79 mg/dL (ref 0.61–1.24)
Chloride: 106 mmol/L (ref 98–111)
Glucose, Bld: 127 mg/dL — ABNORMAL HIGH (ref 70–99)
Potassium: 4.5 mmol/L (ref 3.5–5.1)
Sodium: 140 mmol/L (ref 135–145)

## 2017-08-10 LAB — CBC
HCT: 36.3 % — ABNORMAL LOW (ref 39.0–52.0)
HEMOGLOBIN: 12.7 g/dL — AB (ref 13.0–17.0)
MCH: 33.3 pg (ref 26.0–34.0)
MCHC: 35 g/dL (ref 30.0–36.0)
MCV: 95.3 fL (ref 78.0–100.0)
PLATELETS: 355 10*3/uL (ref 150–400)
RBC: 3.81 MIL/uL — AB (ref 4.22–5.81)
RDW: 19.6 % — ABNORMAL HIGH (ref 11.5–15.5)
WBC: 13.9 10*3/uL — ABNORMAL HIGH (ref 4.0–10.5)

## 2017-08-10 LAB — HIV ANTIBODY (ROUTINE TESTING W REFLEX): HIV SCREEN 4TH GENERATION: NONREACTIVE

## 2017-08-10 MED ORDER — LEVALBUTEROL HCL 0.63 MG/3ML IN NEBU
0.6300 mg | INHALATION_SOLUTION | Freq: Three times a day (TID) | RESPIRATORY_TRACT | Status: DC
  Administered 2017-08-10 – 2017-08-11 (×4): 0.63 mg via RESPIRATORY_TRACT
  Filled 2017-08-10 (×4): qty 3

## 2017-08-10 MED ORDER — IPRATROPIUM BROMIDE 0.02 % IN SOLN
0.5000 mg | Freq: Three times a day (TID) | RESPIRATORY_TRACT | Status: DC
  Administered 2017-08-10 – 2017-08-11 (×4): 0.5 mg via RESPIRATORY_TRACT
  Filled 2017-08-10 (×4): qty 2.5

## 2017-08-10 MED ORDER — AZITHROMYCIN 250 MG PO TABS
250.0000 mg | ORAL_TABLET | Freq: Every day | ORAL | Status: DC
  Administered 2017-08-11: 250 mg via ORAL
  Filled 2017-08-10: qty 1

## 2017-08-10 MED ORDER — IPRATROPIUM-ALBUTEROL 0.5-2.5 (3) MG/3ML IN SOLN: 3.0000 mL | Freq: Three times a day (TID) | RESPIRATORY_TRACT | Status: DC

## 2017-08-10 MED ORDER — GUAIFENESIN ER 600 MG PO TB12
1200.0000 mg | ORAL_TABLET | Freq: Two times a day (BID) | ORAL | Status: DC
  Administered 2017-08-10 – 2017-08-11 (×3): 1200 mg via ORAL
  Filled 2017-08-10 (×3): qty 2

## 2017-08-10 MED ORDER — AZITHROMYCIN 250 MG PO TABS
500.0000 mg | ORAL_TABLET | Freq: Every day | ORAL | Status: AC
  Administered 2017-08-10: 500 mg via ORAL
  Filled 2017-08-10: qty 2

## 2017-08-10 NOTE — Progress Notes (Signed)
SATURATION QUALIFICATIONS: (This note is used to comply with regulatory documentation for home oxygen)  Patient Saturations on Room Air at Rest = 94%  Patient Saturations on Room Air while Ambulating = 86-90%  Patient Saturations on 2 Liters of oxygen while Ambulating = 91%  Please briefly explain why patient needs home oxygen: Pt needs 2-3 L of O2 in order to maintain sats above 86%.

## 2017-08-10 NOTE — Progress Notes (Signed)
PROGRESS NOTE    Aaron Frost  STM:[id number] DOB: 1980/11/14 DOA: 08/09/2017 PCP: Inda Coke, PA   Brief Narrative:  Aaron Frost is a 37 y.o. Morbidly obsee male with medical history significant of hypertension, obstructive sleep apnea, and seasonal allergies. He reports worsening dyspnea for the last 3 weeks and states he was treated for a PNA last week by his PCP.  His symptoms started with an upper respiratory tract infection with postnasal dripping, dry cough and fevers. Positive sick contact at work.  4 days after his symptoms started he was seen by his primary care physician who prescribed antibiotic therapy with no significant improvement, 7 days later his antibiotics were changed to levofloxacin, added albuterol inhaler and Medrol Dosepak.  He was seen today in the office with worsening dyspnea, and he was referred to the emergency room for further evaluation.  His dyspnea has been moderate to severe in intensity, worse with exertion, associated with dry cough and wheezing, improved with albuterol.  No further fevers. In the ED the patient was found to be hypoxic, dyspneic, and had severe wheezing which was refractive to initial therapy in the emergency department. He was admitted for an Acute Asthma Exacerbation.  Assessment & Plan:   Active Problems:   Allergic rhinitis   OSA on CPAP   BMI 50.0-59.9, adult (HCC)   Essential hypertension   Gastroesophageal reflux disease   Asthma   Normocytic anemia   Hyperglycemia  Hyperactive Airway Disease from Acute Asthma Exacerbation complicated by Acute Respiratory Failure with Hypoxia -Admitted as an Inpatient to [facility] -Continued aggressive bronchodilator therapy with DuoNebs however changed to Xopenex/Atrovent TID due to "jittery and will continue DuoNeb 3 mL q2hprn -C/w Budesonide 0.25 mg Neb BID -C/w Systemic steroids with IV Methylprednisolone 40 mg daily and GI Prophylaxis with Pantoprazole 40 mg po Daily    -Monitor peak flow meter pre-and post bronchodilator.   -Changed cough suppressive therapy with Guaifenesin DM every 6 hours to just expectorant therapy with Guaifenesin 1200 mg po BID.  -C/w Oximetry monitoring and supplemental oxygen per nasal cannula, target oxygen saturation greater than 92% and wean O2 as tolerated -Start Z-Pack with 500 mg po Today and 250 mg po Daily x4 doses after -C/w Loratadine 10 mg po PRN Allergies and with Fluticasone 2 spray each Nare Daily -Flutter Valve and Incentive Spirometry  -Home Ambulatory Screen to be Done today -Repeat CXR in AM  Allergic Rhinitis/Seasonal Allergies -C/w Loratadine 10 mg po PRN Allergies and with Fluticasone 2 spray each Nare Daily  Hyperglycemia with Hx of IFG -? Related to IV Steroids -Will check HbA1c -Continue to Monitor CBG's   Normocytic Anemia -Patient's Hb/Hct went from 14.4/40.7 -> 12.7/36.3 -Check Anemia Panel in AM -Continue to Monitor for S/Sx of Bleeding -Repeat CBC in AM   Hypertension.   -Will resume antihypertensive therapy with amlodipine 5 mg daily.   -At home patient has been off antihypertensive agents.  Obstructive Sleep Apnea.   -Resume CPAP at night with home settings.  Morbid Obesity -Will have patient out of bed as tolerated, need outpatient follow-up.  -BMI was 55.44 kg/m2 -Weight Loss Counseling Given -Patient hopeful for Lap Band Procedure this year  Leukocytosis -WBC went from 9.0 -> 13.9 -Likely in the setting of IV Steroid Demargination -Continue to Monitor for S/Sx of Infection -Repeat CBC in AM   GERD -C/w Pantoprazole 40 mg po Daily   DVT prophylaxis: Enoxaparin 90 mg sq q24h Code Status: FULL CODE Family Communication: No family  present at bedside  Disposition Plan: Anticipate D/C Home in the Next 24-48 hours   Consultants:   None   Procedures: None  Antimicrobials:  Anti-infectives (From admission, onward)   Start     Dose/Rate Route Frequency Ordered Stop    08/11/17 1000  azithromycin (ZITHROMAX) tablet 250 mg     250 mg Oral Daily 08/10/17 0904 08/15/17 0959   08/10/17 1000  azithromycin (ZITHROMAX) tablet 500 mg     500 mg Oral Daily 08/10/17 0904 08/10/17 1110     Subjective: Seen and examined at bedside and patient states that his breathing was somewhat better.  No chest pain, lightheadedness or dizziness.  No other concerns or complaints at this time he feels as if he is improving  Objective: Vitals:   08/09/17 1606 08/09/17 2043 08/09/17 2048 08/10/17 0814  BP: (!) 160/90  140/81   Pulse: (!) 103     Resp: (!) 22  20   Temp:      TempSrc: Oral     SpO2: 93% 94% 94% 98%  Weight: (!) 188 kg (414 lb 7.4 oz)     Height: 6' 0.5" (1.842 m)      No intake or output data in the 24 hours ending 08/10/17 1200 Filed Weights   08/09/17 1606  Weight: (!) 188 kg (414 lb 7.4 oz)   Examination: Physical Exam:  Constitutional: WN/WD morbidly obese male in NAD and appears calm and comfortable Eyes: Lids and conjunctivae normal, sclerae anicteric  ENMT: External Ears, Nose appear normal. Grossly normal hearing. Mucous membranes are moist.  Neck: Appears normal, supple, no cervical masses, normal ROM, no appreciable thyromegaly, difficult to assess JVD Respiratory: Diminished to auscultation bilaterally with mild wheezing; No appreciable rales, rhonchi or crackles. Normal respiratory effort and patient is not tachypenic. No accessory muscle use.  Cardiovascular: Slightly tachycardic but regular Rhythm, no appreciable murmurs / rubs / gallops. S1 and S2 auscultated. Trace Extremity edema Abdomen: Soft, non-tender, Distended due to body habitus. No masses palpated. No appreciable hepatosplenomegaly. Bowel sounds positive x4.  GU: Deferred. Musculoskeletal: No clubbing / cyanosis of digits/nails. No joint deformity upper and lower extremities. G Skin: No rashes, lesions, ulcers on a limited skin evaluation. No induration; Warm and dry.    Neurologic: CN 2-12 grossly intact with no focal deficits. Romberg sign and cerebellar reflexes not assessed.  Psychiatric: Normal judgment and insight. Alert and oriented x 3. Normal mood and appropriate affect.   Data Reviewed: I have personally reviewed following labs and imaging studies  CBC: Recent Labs  Lab 08/09/17 1055 08/09/17 1558 08/10/17 0322  WBC 8.3 9.0 13.9*  NEUTROABS 5.3  --   --   HGB 14.1 14.4 12.7*  HCT 39.5 40.7 36.3*  MCV 89.8 90.0 95.3  PLT 184 293 818   Basic Metabolic Panel: Recent Labs  Lab 08/09/17 1055 08/09/17 1558 08/10/17 0322  NA 140  --  140  K 3.8  --  4.5  CL 105  --  106  CO2 26  --  25  GLUCOSE 126*  --  127*  BUN 10  --  12  CREATININE 0.99 1.07 0.79  CALCIUM 8.7*  --  8.9   GFR: Estimated Creatinine Clearance: 216.9 mL/min (by C-G formula based on SCr of 0.79 mg/dL). Liver Function Tests: Recent Labs  Lab 08/09/17 1055  AST 38  ALT 47*  ALKPHOS 71  BILITOT 0.9  PROT 6.8  ALBUMIN 3.5   No results for input(s): LIPASE,  AMYLASE in the last 168 hours. No results for input(s): AMMONIA in the last 168 hours. Coagulation Profile: No results for input(s): INR, PROTIME in the last 168 hours. Cardiac Enzymes: Recent Labs  Lab 08/09/17 1229  TROPONINI <0.03   BNP (last 3 results) No results for input(s): PROBNP in the last 8760 hours. HbA1C: No results for input(s): HGBA1C in the last 72 hours. CBG: No results for input(s): GLUCAP in the last 168 hours. Lipid Profile: No results for input(s): CHOL, HDL, LDLCALC, TRIG, CHOLHDL, LDLDIRECT in the last 72 hours. Thyroid Function Tests: No results for input(s): TSH, T4TOTAL, FREET4, T3FREE, THYROIDAB in the last 72 hours. Anemia Panel: No results for input(s): VITAMINB12, FOLATE, FERRITIN, TIBC, IRON, RETICCTPCT in the last 72 hours. Sepsis Labs: Recent Labs  Lab 08/09/17 1109  LATICACIDVEN 1.21    No results found for this or any previous visit (from the past 240  hour(s)).   Radiology Studies: Dg Chest 2 View  Result Date: 08/09/2017 CLINICAL DATA:  Short of breath EXAM: CHEST - 2 VIEW COMPARISON:  07/31/2017 FINDINGS: The heart size and mediastinal contours are within normal limits. Both lungs are clear. The visualized skeletal structures are unremarkable. IMPRESSION: No active cardiopulmonary disease. Electronically Signed   By: Franchot Gallo M.D.   On: 08/09/2017 11:16   Scheduled Meds: . [START ON 08/11/2017] azithromycin  250 mg Oral Daily  . budesonide (PULMICORT) nebulizer solution  0.25 mg Nebulization BID  . enoxaparin (LOVENOX) injection  90 mg Subcutaneous Q24H  . fluticasone  2 spray Each Nare Daily  . guaiFENesin  1,200 mg Oral BID  . ipratropium  0.5 mg Nebulization TID  . levalbuterol  0.63 mg Nebulization TID  . methylPREDNISolone (SOLU-MEDROL) injection  40 mg Intravenous Daily  . pantoprazole  40 mg Oral Daily  . sodium chloride flush  3 mL Intravenous Q12H   Continuous Infusions: . sodium chloride    . albuterol Stopped (08/09/17 1444)    LOS: 1 day   Kerney Elbe, DO [facility] Pager [phone]  If 7PM-7AM, please contact night-coverage [email or web address] Password [email or web address] 08/10/2017, 12:00 PM

## 2017-08-11 ENCOUNTER — Inpatient Hospital Stay (HOSPITAL_COMMUNITY): Payer: BLUE CROSS/BLUE SHIELD

## 2017-08-11 LAB — CBC WITH DIFFERENTIAL/PLATELET
ABS IMMATURE GRANULOCYTES: 0 10*3/uL (ref 0.0–0.1)
Basophils Absolute: 0 10*3/uL (ref 0.0–0.1)
Basophils Relative: 0 %
Eosinophils Absolute: 0 10*3/uL (ref 0.0–0.7)
Eosinophils Relative: 1 %
HCT: 52.3 % — ABNORMAL HIGH (ref 39.0–52.0)
Hemoglobin: 16.9 g/dL (ref 13.0–17.0)
Immature Granulocytes: 0 %
Lymphocytes Relative: 20 %
Lymphs Abs: 1.2 10*3/uL (ref 0.7–4.0)
MCH: 28.7 pg (ref 26.0–34.0)
MCHC: 32.3 g/dL (ref 30.0–36.0)
MCV: 88.9 fL (ref 78.0–100.0)
MONO ABS: 0.3 10*3/uL (ref 0.1–1.0)
Monocytes Relative: 4 %
NEUTROS ABS: 4.5 10*3/uL (ref 1.7–7.7)
NEUTROS PCT: 75 %
PLATELETS: 305 10*3/uL (ref 150–400)
RBC: 5.88 MIL/uL — ABNORMAL HIGH (ref 4.22–5.81)
RDW: 17.4 % — ABNORMAL HIGH (ref 11.5–15.5)
WBC: 6 10*3/uL (ref 4.0–10.5)

## 2017-08-11 LAB — COMPREHENSIVE METABOLIC PANEL
ALK PHOS: 62 U/L (ref 38–126)
ALT: 46 U/L — AB (ref 0–44)
ANION GAP: 6 (ref 5–15)
AST: 30 U/L (ref 15–41)
Albumin: 3.3 g/dL — ABNORMAL LOW (ref 3.5–5.0)
BILIRUBIN TOTAL: 0.8 mg/dL (ref 0.3–1.2)
BUN: 18 mg/dL (ref 6–20)
CO2: 29 mmol/L (ref 22–32)
CREATININE: 0.97 mg/dL (ref 0.61–1.24)
Calcium: 8.5 mg/dL — ABNORMAL LOW (ref 8.9–10.3)
Chloride: 105 mmol/L (ref 98–111)
Glucose, Bld: 113 mg/dL — ABNORMAL HIGH (ref 70–99)
Potassium: 4.3 mmol/L (ref 3.5–5.1)
SODIUM: 140 mmol/L (ref 135–145)
TOTAL PROTEIN: 6.4 g/dL — AB (ref 6.5–8.1)

## 2017-08-11 LAB — PHOSPHORUS: PHOSPHORUS: 3.6 mg/dL (ref 2.5–4.6)

## 2017-08-11 LAB — MAGNESIUM: Magnesium: 2.5 mg/dL — ABNORMAL HIGH (ref 1.7–2.4)

## 2017-08-11 MED ORDER — IPRATROPIUM-ALBUTEROL 0.5-2.5 (3) MG/3ML IN SOLN: 3.0000 mL | Freq: Four times a day (QID) | RESPIRATORY_TRACT | 0 refills | Status: DC | PRN

## 2017-08-11 MED ORDER — PREDNISONE 10 MG (21) PO TBPK: ORAL_TABLET | ORAL | 0 refills | Status: DC

## 2017-08-11 MED ORDER — AZITHROMYCIN 250 MG PO TABS: 250.0000 mg | ORAL_TABLET | Freq: Every day | ORAL | 0 refills | Status: DC

## 2017-08-11 MED ORDER — AMLODIPINE BESYLATE 5 MG PO TABS: 5.0000 mg | ORAL_TABLET | Freq: Every day | ORAL | 0 refills | Status: DC

## 2017-08-11 MED ORDER — GUAIFENESIN ER 600 MG PO TB12: 1200.0000 mg | ORAL_TABLET | Freq: Two times a day (BID) | ORAL | 0 refills | Status: DC

## 2017-08-11 NOTE — Progress Notes (Addendum)
Pt given DC instructions and belongings returned. Pt had no further questions at time of discharge. PT DC on 2L of O2 and sent home with a full tank. [facility] to follow up with pt post DC. Pts vitals stable upon DC.   Riley Kill RN

## 2017-08-11 NOTE — Progress Notes (Signed)
SATURATION QUALIFICATIONS: (This note is used to comply with regulatory documentation for home oxygen)  Patient Saturations on Room Air at Rest = 92%  Patient Saturations on Room Air while Ambulating = 84%  Patient Saturations on 2-3 Liters of oxygen while Ambulating = 90-92%  Please briefly explain why patient needs home oxygen: Pt requires O2 for O2 sats to stay above 88% while ambulating.

## 2017-08-11 NOTE — Discharge Summary (Signed)
Physician Discharge Summary  Aaron Frost HDQ:[id number] DOB: Nov 27, 1980 DOA: 08/09/2017  PCP: Inda Coke, PA  Admit date: 08/09/2017 Discharge date: 08/11/2017  Admitted From: Home Disposition: Home  Recommendations for Outpatient Follow-up:  1. Follow up with PCP in 1-2 weeks 2. Follow up with Pulmonary in the outpatient setting for OSA and Asthma Evaluation; Have Outpatient Sleep Study 3. Have outpatient ECHOCardiogram done if SOB persists  4. Please obtain CMP/CBC, Mag, Phos in one week 5. Please follow up on the following pending results:  Home Health: No Equipment/Devices: DME Nebulizer and Home O2 3 Liters via [place]   Discharge Condition: Stable CODE STATUS: FULL CODE Diet recommendation: Heart Healthy Diet   Brief/Interim Summary: Aaron Frost a 37 y.o. Morbidly obseemalewith medical history significant ofhypertension, obstructive sleep apnea, and seasonal allergies. He reports worsening dyspnea for the last 3 weeks and states he was treated for a PNA last week by his PCP. His symptoms started with an upper respiratory tract infection with postnasal dripping, dry cough and fevers.Positivesick contact at work. 4 days after his symptoms startedhe was seen by his primary care physician who prescribed antibiotic therapywithno significant improvement, 7 days later his antibiotics were changed to levofloxacin, added albuterol inhaler and Medrol Dosepak. He was seen today in the office with worsening dyspnea, and he was referred to the emergency room for further evaluation.  His dyspnea has been moderate to severe in intensity, worse with exertion, associated withdrycough and wheezing, improved with albuterol.No further fevers.In the ED the patient was found to be hypoxic, dyspneic, and had severe wheezing which was refractive to initial therapy in the emergency department. He was admitted for an Acute Asthma Exacerbation and improved with treatment. He  however continued to Desaturate on Ambulation from Exacerbation and deconditioning so will require home O2 3 L and will need pulmonary follow-up in outpatient setting.  He is deemed medically stable to be discharged at this time not we will follow-up with PCP and pulmonology in outpatient setting.  Discharge Diagnoses:  Active Problems:   Allergic rhinitis   OSA on CPAP   BMI 50.0-59.9, adult (HCC)   Essential hypertension   Gastroesophageal reflux disease   Asthma   Normocytic anemia   Hyperglycemia  Hyperactive Airway Disease from Acute Asthma Exacerbation complicated by Acute Respiratory Failure with Hypoxia, improving  -Admitted as an Inpatient to [facility] -Continued aggressive bronchodilator therapy with DuoNebs however changed to Xopenex/Atrovent TID due to "jittery and will continue DuoNeb 3 mL q2hprn -C/w Budesonide 0.25 mg Neb BID -C/w Systemic steroids with IV Methylprednisolone 40 mg daily and changed to po Prednisone Taper -C/w Prophylaxis with Pantoprazole 40 mg po Daily  -Monitor peak flow meterpre-and post bronchodilator. -Changed cough suppressive therapy with Guaifenesin DM every 6 hours to just expectorant therapy with Guaifenesin 1200 mg po BID.  -C/w Oximetry monitoring and supplemental oxygen per nasal cannula, target oxygen saturation greater than 92% and wean O2 as tolerated -Start Z-Pack with 500 mg po Today and 250 mg po Daily x4 doses after -C/w Loratadine 10 mg po PRN Allergies and with Fluticasone 2 spray each Nare Daily -Appears Euvolemic  -Flutter Valve and Incentive Spirometry  -Home Ambulatory Screen to be Done yesterday and today and patient desaturates on Ambulation; Will require 3 Liters of Home O2 -Will also write for DuoNEb at Home with Home Nebulizer  -Repeat CXR this AM showed Stable exam.  No active cardiopulmonary disease. -Follow up with PCP and with Pulmonary as an outpatient   Allergic Rhinitis/Seasonal  Allergies -C/w Loratadine 10 mg  po PRN Allergies and with Fluticasone 2 spray each Nare Daily  Hyperglycemia with Hx of IFG -? Related to IV Steroids -Check HbA1c as an outpatient  -Continue to Monitor CBG's   Normocytic Anemia -Patient's Hb/Hct went from 14.4/40.7 -> 12.7/36.3 -> 16.9/52.3 -Check Anemia Panel as an outpatient  -Continue to Monitor for S/Sx of Bleeding -Repeat CBC in AM   Hypertension.  -Will resume antihypertensive therapy with amlodipine 5 mg daily.  -At home patient has been off antihypertensive agents.  Obstructive Sleep Apnea.  -Resume CPAP at night with home settings. -Outpatient Sleep Study evaluation by Pulmonary   Morbid Obesity -Will have patient out of bed as tolerated, need outpatient follow-up.  -BMI was 55.44 kg/m2 -Weight Loss Counseling Given -Patient hopeful for Lap Band Procedure this year  Leukocytosis -WBC went from 9.0 -> 13.9 -> 6.0 -Likely in the setting of IV Steroid Demargination -Continue to Monitor for S/Sx of Infection -Repeat CBC in AM   GERD -C/w Pantoprazole 40 mg po Daily   Discharge Instructions  Discharge Instructions    Call MD for:  difficulty breathing, headache or visual disturbances   Complete by:  As directed    Call MD for:  extreme fatigue   Complete by:  As directed    Call MD for:  hives   Complete by:  As directed    Call MD for:  persistant dizziness or light-headedness   Complete by:  As directed    Call MD for:  persistant nausea and vomiting   Complete by:  As directed    Call MD for:  redness, tenderness, or signs of infection (pain, swelling, redness, odor or green/yellow discharge around incision site)   Complete by:  As directed    Call MD for:  severe uncontrolled pain   Complete by:  As directed    Call MD for:  temperature >100.4   Complete by:  As directed    Diet - low sodium heart healthy   Complete by:  As directed    Discharge instructions   Complete by:  As directed    Follow-up with primary care  physician as well as pulmonology in outpatient setting.  Take all medications as prescribed.  If symptoms change or worsen please return the emergency room for further evaluation.   Increase activity slowly   Complete by:  As directed      Allergies as of 08/11/2017      Reactions   Aleve [naproxen] Other (See Comments)   Burning sensation and headaches   Nickel Rash      Medication List    STOP taking these medications   dextromethorphan-guaiFENesin 30-600 MG 12hr tablet Commonly known as:  MUCINEX DM     TAKE these medications   acetaminophen 500 MG tablet Commonly known as:  TYLENOL Take 500 mg by mouth every 6 (six) hours as needed for mild pain.   albuterol 108 (90 Base) MCG/ACT inhaler Commonly known as:  PROVENTIL HFA;VENTOLIN HFA Inhale 2 puffs into the lungs every 6 (six) hours as needed for wheezing or shortness of breath.   amLODipine 5 MG tablet Commonly known as:  NORVASC Take 1 tablet (5 mg total) by mouth daily.   azithromycin 250 MG tablet Commonly known as:  ZITHROMAX Take 1 tablet (250 mg total) by mouth daily.   fluticasone 50 MCG/ACT nasal spray Commonly known as:  FLONASE Place 2 sprays into both nostrils daily. What changed:  when to take this  reasons to take this   guaiFENesin 600 MG 12 hr tablet Commonly known as:  MUCINEX Take 2 tablets (1,200 mg total) by mouth 2 (two) times daily.   ibuprofen 200 MG tablet Commonly known as:  ADVIL,MOTRIN Take 400 mg by mouth every 6 (six) hours as needed for headache.   ipratropium-albuterol 0.5-2.5 (3) MG/3ML Soln Commonly known as:  DUONEB Take 3 mLs by nebulization every 6 (six) hours as needed.   loratadine 10 MG tablet Commonly known as:  CLARITIN Take 10 mg by mouth daily as needed for allergies.   multivitamin with minerals Tabs tablet Take 1 tablet by mouth daily.   omeprazole 20 MG tablet Commonly known as:  PRILOSEC OTC Take 20 mg by mouth daily.   predniSONE 10 MG (21) Tbpk  tablet Commonly known as:  STERAPRED UNI-PAK 21 TAB Take 6 tablets day 1, 5 tablets day 2, 4 tablets day 3, 3 tablets day 4, 2 tablets day 5, 1 tablet day 6 and stop on day 7            Durable Medical Equipment  (From admission, onward)        Start     Ordered   08/11/17 1546  For home use only DME oxygen  Once    Question Answer Comment  Mode or (Route) Nasal cannula   Liters per Minute 3   Frequency Continuous (stationary and portable oxygen unit needed)   Oxygen conserving device No   Oxygen delivery system Gas      08/11/17 1545   08/11/17 0811  For home use only DME Nebulizer/meds  Once    Question:  Patient needs a nebulizer to treat with the following condition  Answer:  Asthma   08/11/17 0810     Follow-up Information    Inda Coke, PA. Call.   Specialty:  Physician Assistant Why:  Follow up within 1 week Contact information: [place] [address] [phone]        [facility]. Call.   Specialty:  Pulmonology Why:  Call to schedule follow up for Sleep Study and for Asthma Evaluation as recently had exacerbation Contact information: [address] [place] Follow up.   Why:  For home oxygen and nebulizer, they will be delivered to [facility] prior to DC.  Contact information: [place] [address] [phone]          Allergies  Allergen Reactions  . Aleve [Naproxen] Other (See Comments)    Burning sensation and headaches  . Nickel Rash   Consultations:  None  Procedures/Studies: Dg Chest 2 View  Result Date: 08/11/2017 CLINICAL DATA:  Shortness of breath.  Hypoxia.  Pneumonia. EXAM: CHEST - 2 VIEW COMPARISON:  08/09/2017 FINDINGS: The heart size and mediastinal contours are within normal limits. Both lungs are clear. The visualized skeletal structures are unremarkable. IMPRESSION: Stable exam.  No active  cardiopulmonary disease. Electronically Signed   By: Earle Gell M.D.   On: 08/11/2017 07:56   Dg Chest 2 View  Result Date: 08/09/2017 CLINICAL DATA:  Short of breath EXAM: CHEST - 2 VIEW COMPARISON:  07/31/2017 FINDINGS: The heart size and mediastinal contours are within normal limits. Both lungs are clear. The visualized skeletal structures are unremarkable. IMPRESSION: No active cardiopulmonary disease. Electronically Signed   By: Franchot Gallo M.D.  On: 08/09/2017 11:16   Dg Chest 2 View  Result Date: 07/31/2017 CLINICAL DATA:  Worsening chest congestion over the past week. Nonproductive cough. Former smoker. EXAM: CHEST - 2 VIEW COMPARISON:  PA and lateral chest x-ray of April 03, 2011 FINDINGS: The lungs are well-expanded. The interstitial markings are increased bilaterally and more conspicuous than on the previous study. There is chronic prominence of the right hilar region which is slightly more conspicuous today. The heart is top-normal in size. The pulmonary vascularity is normal. The mediastinum is normal in width. There is no pleural effusion. The bony thorax is unremarkable. IMPRESSION: Chronic bronchitic changes. Increased interstitial prominence bilaterally may reflect the sequelae of acute bronchitis or developing interstitial pneumonia. There is no overt CHF. Persistent right hilar prominence slightly more conspicuous today. Followup PA and lateral chest X-ray is recommended in 3-4 weeks following trial of antibiotic therapy to ensure resolution and exclude underlying malignancy. Electronically Signed   By: David  Martinique M.D.   On: 07/31/2017 08:24   Dg Abd 2 Views  Result Date: 07/17/2017 CLINICAL DATA:  LEFT upper quadrant pain, bloating for 2 weeks. Abdominal pain. EXAM: ABDOMEN - 2 VIEW COMPARISON:  CT abdomen and pelvis February 05, 2014 FINDINGS: Large body habitus. The bowel gas pattern is normal. There is no evidence of free air. No radio-opaque calculi or other significant  radiographic abnormality is seen. IMPRESSION: Negative. Electronically Signed   By: Elon Alas M.D.   On: 07/17/2017 01:39    Subjective: Seen and examined and breathing was better.  Was surprised that he desaturated on ambulation yesterday however he states he gets fatigued on long distance ambulation and climbing stairs because he is usually inactive.  No other concerns or complaints at this time and states he has no shortness of breath, chest pain lightheadedness or dizziness.  Ready to go home but feels like he should go to his parents house first since he does not to climb stairs to get out of his acute asthma exacerbation.  Discharge Exam: Vitals:   08/11/17 0758 08/11/17 1356  BP: 136/79   Pulse: (!) 58   Resp: 18   Temp: 97.9 F (36.6 C)   SpO2: 100% 98%   Vitals:   08/11/17 0018 08/11/17 0731 08/11/17 0758 08/11/17 1356  BP: 136/76  136/79   Pulse: 63  (!) 58   Resp:   18   Temp: 98 F (36.7 C)  97.9 F (36.6 C)   TempSrc: Oral  Oral   SpO2: 99% 97% 100% 98%  Weight:      Height:       General: Pt is alert, awake, not in acute distress Cardiovascular: RRR, S1/S2 +, no rubs, no gallops Respiratory: Diminished bilaterally with very mild wheezing, no rhonchi; Unlabored breathing and respirations but wearing supplemental O2 via [place] Abdominal: Soft, NT, Distended due to body habitus, bowel sounds + Extremities: Trace edema, no cyanosis  The results of significant diagnostics from this hospitalization (including imaging, microbiology, ancillary and laboratory) are listed below for reference.    Microbiology: No results found for this or any previous visit (from the past 240 hour(s)).   Labs: BNP (last 3 results) Recent Labs    08/09/17 1229  BNP 9.1   Basic Metabolic Panel: Recent Labs  Lab 08/09/17 1055 08/09/17 1558 08/10/17 0322 08/11/17 0347  NA 140  --  140 140  K 3.8  --  4.5 4.3  CL 105  --  106 105  CO2 26  --  25 29  GLUCOSE 126*  --  127*  113*  BUN 10  --  12 18  CREATININE 0.99 1.07 0.79 0.97  CALCIUM 8.7*  --  8.9 8.5*  MG  --   --   --  2.5*  PHOS  --   --   --  3.6   Liver Function Tests: Recent Labs  Lab 08/09/17 1055 08/11/17 0347  AST 38 30  ALT 47* 46*  ALKPHOS 71 62  BILITOT 0.9 0.8  PROT 6.8 6.4*  ALBUMIN 3.5 3.3*   No results for input(s): LIPASE, AMYLASE in the last 168 hours. No results for input(s): AMMONIA in the last 168 hours. CBC: Recent Labs  Lab 08/09/17 1055 08/09/17 1558 08/10/17 0322 08/11/17 0347  WBC 8.3 9.0 13.9* 6.0  NEUTROABS 5.3  --   --  4.5  HGB 14.1 14.4 12.7* 16.9  HCT 39.5 40.7 36.3* 52.3*  MCV 89.8 90.0 95.3 88.9  PLT 184 293 355 305   Cardiac Enzymes: Recent Labs  Lab 08/09/17 1229  TROPONINI <0.03   BNP: Invalid input(s): POCBNP CBG: No results for input(s): GLUCAP in the last 168 hours. D-Dimer No results for input(s): DDIMER in the last 72 hours. Hgb A1c No results for input(s): HGBA1C in the last 72 hours. Lipid Profile No results for input(s): CHOL, HDL, LDLCALC, TRIG, CHOLHDL, LDLDIRECT in the last 72 hours. Thyroid function studies No results for input(s): TSH, T4TOTAL, T3FREE, THYROIDAB in the last 72 hours.  Invalid input(s): FREET3 Anemia work up No results for input(s): VITAMINB12, FOLATE, FERRITIN, TIBC, IRON, RETICCTPCT in the last 72 hours. Urinalysis    Component Value Date/Time   COLORURINE YELLOW 08/09/2017 1750   APPEARANCEUR CLEAR 08/09/2017 1750   LABSPEC 1.026 08/09/2017 1750   PHURINE 5.0 08/09/2017 1750   GLUCOSEU NEGATIVE 08/09/2017 1750   GLUCOSEU NEGATIVE 05/06/2014 0936   HGBUR SMALL (A) 08/09/2017 1750   BILIRUBINUR NEGATIVE 08/09/2017 1750   KETONESUR 20 (A) 08/09/2017 1750   PROTEINUR NEGATIVE 08/09/2017 1750   UROBILINOGEN 0.2 05/06/2014 0936   NITRITE NEGATIVE 08/09/2017 1750   LEUKOCYTESUR NEGATIVE 08/09/2017 1750   Sepsis Labs Invalid input(s): PROCALCITONIN,  WBC,  LACTICIDVEN Microbiology No results  found for this or any previous visit (from the past 240 hour(s)).  Time coordinating discharge: 35 minutes  SIGNED:  Kerney Elbe, DO [facility] 08/11/2017, 4:43 PM Pager [phone]  If 7PM-7AM, please contact night-coverage [email or web address] Password [email or web address]

## 2017-08-11 NOTE — Discharge Instructions (Signed)

## 2017-08-11 NOTE — Care Management Note (Signed)
Case Management Note  Patient Details  Name: Aaron Frost MRN: [id number] Date of Birth: [birth year]  Subjective/Objective:                Patient admitted with asthma.     Action/Plan:  Will DC to home today with nebulizer and home oxygen. [facility] will deliver two tanks for transportation home to room and set up concentrator in home later today. They are aware patient will stay with his parents in [place] immediatly following DC and will get address from patient when they deliver tanks to room.  Expected Discharge Date:  08/11/17               Expected Discharge Plan:  Home/Self Care  In-House Referral:     Discharge planning Services  CM Consult  Post Acute Care Choice:  Durable Medical Equipment Choice offered to:  Patient  DME Arranged:  Oxygen, Nebulizer/meds DME Agency:  [place]:    [facility] Agency:     Status of Service:  Completed, signed off  If discussed at [place] of Stay Meetings, dates discussed:    Additional Comments:  Carles Collet, RN 08/11/2017, 4:15 PM

## 2017-08-12 ENCOUNTER — Telehealth: Payer: Self-pay | Admitting: *Deleted

## 2017-08-12 DIAGNOSIS — G4733 Obstructive sleep apnea (adult) (pediatric): Secondary | ICD-10-CM | POA: Diagnosis not present

## 2017-08-12 NOTE — Telephone Encounter (Signed)
Per chart review: Admit date: 08/09/2017 Discharge date: 08/11/2017  Admitted From: Home Disposition: Home  Recommendations for Outpatient Follow-up:  1. Follow up with PCP in 1-2 weeks 2. Follow up with Pulmonary in the outpatient setting for OSA and Asthma Evaluation; Have Outpatient Sleep Study 3. Have outpatient ECHOCardiogram done if SOB persists  4. Please obtain CMP/CBC, Mag, Phos in one week 5. Please follow up on the following pending results:  Home Health: No Equipment/Devices: DME Nebulizer and Home O2 3 Liters via [employer]   Discharge Condition: Stable CODE STATUS: FULL CODE Diet recommendation: Heart Healthy Diet  ___________________________________________________________________________ Per telephone call: Transition Care Management Follow-up Telephone Call   Date discharged? 08/11/17   How have you been since you were released from the hospital? "on the mend"   Do you understand why you were in the hospital? yes   Do you understand the discharge instructions? yes   Where were you discharged to? Home   Items Reviewed:  Medications reviewed: yes  Allergies reviewed: yes  Dietary changes reviewed: yes  Referrals reviewed: yes   Functional Questionnaire:   Activities of Daily Living (ADLs):   He states they are independent in the following: ambulation, bathing and hygiene, feeding, continence, grooming, toileting and dressing States they require assistance with the following: Staying at parents house because his apartment is on the 3rd floor. Parents helping to prepare meals.   Any transportation issues/concerns?: no   Any patient concerns? Yes. Patients states he was placed back on his Amlodipine when he was in the hospital. States he has been off of it for awhile. I asked him to check his BP twice a day and bring the record with his to his office visit. Patient is also going to check his pulse ox when he feels short of breath to see where it is at. I  explained that it will take a few days for his medications to kick in so he should not over exert himself and sit down and take good deep breaths when he starts feeling short of breath. Patient stated understanding.   Confirmed importance and date/time of follow-up visits scheduled yes  Provider Appointment booked with Inda Coke, [place] 08/16/17  Confirmed with patient if condition begins to worsen call PCP or go to the ER.  Patient was given the office number and encouraged to call back with question or concerns.  : yes

## 2017-08-14 DIAGNOSIS — G4733 Obstructive sleep apnea (adult) (pediatric): Secondary | ICD-10-CM | POA: Diagnosis not present

## 2017-08-16 ENCOUNTER — Encounter: Payer: Self-pay | Admitting: Pulmonary Disease

## 2017-08-16 ENCOUNTER — Encounter: Payer: Self-pay | Admitting: Physician Assistant

## 2017-08-16 ENCOUNTER — Ambulatory Visit (INDEPENDENT_AMBULATORY_CARE_PROVIDER_SITE_OTHER): Payer: BLUE CROSS/BLUE SHIELD | Admitting: Pulmonary Disease

## 2017-08-16 ENCOUNTER — Ambulatory Visit (INDEPENDENT_AMBULATORY_CARE_PROVIDER_SITE_OTHER): Payer: BLUE CROSS/BLUE SHIELD | Admitting: Physician Assistant

## 2017-08-16 VITALS — BP 124/90 | HR 64 | Temp 98.7°F | Ht 72.05 in | Wt >= 6400 oz

## 2017-08-16 VITALS — BP 126/84 | HR 92 | Ht 72.5 in | Wt >= 6400 oz

## 2017-08-16 DIAGNOSIS — R600 Localized edema: Secondary | ICD-10-CM

## 2017-08-16 DIAGNOSIS — R0602 Shortness of breath: Secondary | ICD-10-CM | POA: Diagnosis not present

## 2017-08-16 DIAGNOSIS — J4551 Severe persistent asthma with (acute) exacerbation: Secondary | ICD-10-CM

## 2017-08-16 DIAGNOSIS — G4733 Obstructive sleep apnea (adult) (pediatric): Secondary | ICD-10-CM

## 2017-08-16 DIAGNOSIS — Z0289 Encounter for other administrative examinations: Secondary | ICD-10-CM

## 2017-08-16 DIAGNOSIS — I10 Essential (primary) hypertension: Secondary | ICD-10-CM | POA: Diagnosis not present

## 2017-08-16 DIAGNOSIS — J9611 Chronic respiratory failure with hypoxia: Secondary | ICD-10-CM | POA: Diagnosis not present

## 2017-08-16 LAB — COMPREHENSIVE METABOLIC PANEL
ALBUMIN: 4.2 g/dL (ref 3.5–5.2)
ALT: 39 U/L (ref 0–53)
AST: 17 U/L (ref 0–37)
Alkaline Phosphatase: 72 U/L (ref 39–117)
BUN: 16 mg/dL (ref 6–23)
CHLORIDE: 101 meq/L (ref 96–112)
CO2: 32 mEq/L (ref 19–32)
CREATININE: 0.89 mg/dL (ref 0.40–1.50)
Calcium: 9.4 mg/dL (ref 8.4–10.5)
GFR: 101.6 mL/min (ref >60.00)
Glucose, Bld: 82 mg/dL (ref 70–99)
Potassium: 3.9 mEq/L (ref 3.5–5.1)
SODIUM: 139 meq/L (ref 135–145)
Total Bilirubin: 0.5 mg/dL (ref 0.2–1.2)
Total Protein: 7.3 g/dL (ref 6.0–8.3)

## 2017-08-16 LAB — CBC
HEMATOCRIT: 38.4 % — AB (ref 39.0–52.0)
Hemoglobin: 13.6 g/dL (ref 13.0–17.0)
MCHC: 35.3 g/dL (ref 30.0–36.0)
MCV: 83.7 fl (ref 78.0–100.0)
Platelets: 292 10*3/uL (ref 150.0–400.0)
RBC: 4.59 Mil/uL (ref 4.22–5.81)
RDW: 15 % (ref 11.5–15.5)
WBC: 11.3 10*3/uL — AB (ref 4.0–10.5)

## 2017-08-16 LAB — PHOSPHORUS: Phosphorus: 3.6 mg/dL (ref 2.3–4.6)

## 2017-08-16 LAB — MAGNESIUM: MAGNESIUM: 2.5 mg/dL (ref 1.5–2.5)

## 2017-08-16 MED ORDER — POTASSIUM CHLORIDE CRYS ER 20 MEQ PO TBCR: EXTENDED_RELEASE_TABLET | ORAL | 1 refills | Status: DC

## 2017-08-16 MED ORDER — MONTELUKAST SODIUM 10 MG PO TABS: 10.0000 mg | ORAL_TABLET | Freq: Every day | ORAL | 6 refills | Status: DC

## 2017-08-16 MED ORDER — FUROSEMIDE 20 MG PO TABS: 20.0000 mg | ORAL_TABLET | Freq: Every day | ORAL | 3 refills | Status: DC | PRN

## 2017-08-16 MED ORDER — VALSARTAN 40 MG PO TABS: 40.0000 mg | ORAL_TABLET | Freq: Every day | ORAL | 1 refills | Status: DC

## 2017-08-16 NOTE — Patient Instructions (Addendum)
It was great to see you!  1. Stop Amlodopine 2. Start Valsartan daily 3. Use Lasix 20 mg daily as needed for swelling, when you take a Lasix pill take 1 20 mEq potassium tablet 4. Please keep track of blood pressures and weight daily. If you have significant changes in weight >3 lb overnight, let us know. If your blood pressure is consistently >140/90, let us know.  Let's follow-up in 2 weeks, sooner if you have concerns.  Take care,  Inda Coke PA-C

## 2017-08-16 NOTE — Progress Notes (Signed)
Aaron Frost is a 37 y.o. male is here for Hospital follow up  I acted as a Education administrator for [employer], PA-C Anselmo Pickler, LPN  History of Present Illness:   Chief Complaint  Patient presents with  . Hospitalization Follow-up    HPI  Pt here for follow up from hospital admission 8/2 for Hypoxia, Asthma exacerbation and Bronchospasms. Discharged on 8/4. Pt is feeling better and is back at work since Monday. Pt is wearing Oxygen 3 L/min via nasal cannula continuously. Pt has hoarse voice, coughing and expectorating light white. Denies fever, chills, or SOB. Pt does have bilateral ankle and feet edema. He was started on Amlodopine for his blood pressure, he has been on this in the past, but was able to tolerate in the past due to swelling in bilateral lower extremities.  He is experiencing bilateral lateral lower extremity swelling at this time, is unsure of his due to amlodipine or his prednisone.  He has 2 days left of his prednisone.  He has follow-up with pulmonology today.  He is working from home and doing well with this.  Continues to have shortness of breath when climbing stairs.  Wt Readings from Last 3 Encounters:  08/16/17 (!) 414 lb (187.8 kg)  08/09/17 (!) 414 lb 7.4 oz (188 kg)  08/09/17 (!) 414 lb (187.8 kg)   BP Readings from Last 3 Encounters:  08/16/17 124/90  08/11/17 136/79  08/09/17 124/80    There are no preventive care reminders to display for this patient.  Past Medical History:  Diagnosis Date  . Asthma 08/09/2017  . OSA on CPAP   . Umbilical hernia 1740     Social History   Socioeconomic History  . Marital status: Legally Separated    Spouse name: Not on file  . Number of children: Not on file  . Years of education: Not on file  . Highest education level: Not on file  Occupational History  . Occupation: Research scientist (physical sciences): Sports coach  Social Needs  . Financial resource strain: Not on file  . Food insecurity:    Worry: Not on  file    Inability: Not on file  . Transportation needs:    Medical: Not on file    Non-medical: Not on file  Tobacco Use  . Smoking status: Former Smoker    Packs/day: 1.50    Years: 3.00    Pack years: 4.50    Types: Cigarettes    Last attempt to quit: 01/09/2004    Years since quitting: 13.6  . Smokeless tobacco: Never Used  Substance and Sexual Activity  . Alcohol use: Yes    Comment: Occassional glass of wine  . Drug use: No  . Sexual activity: Never  Lifestyle  . Physical activity:    Days per week: Not on file    Minutes per session: Not on file  . Stress: Not on file  Relationships  . Social connections:    Talks on phone: Not on file    Gets together: Not on file    Attends religious service: Not on file    Active member of club or organization: Not on file    Attends meetings of clubs or organizations: Not on file    Relationship status: Not on file  . Intimate partner violence:    Fear of current or ex partner: Not on file    Emotionally abused: Not on file    Physically abused: Not on file  Forced sexual activity: Not on file  Other Topics Concern  . Not on file  Social History Narrative   Work for [employer] -- sedentary   Divorced, Dec 2015    Live in [place]: walk, watch TV    Past Surgical History:  Procedure Laterality Date  . EXTERNAL EAR SURGERY     right ear reconstruction  . MOLE REMOVAL     [place] of Neck  . TONSILLECTOMY  2005    Family History  Problem Relation Age of Onset  . Colon cancer Paternal Grandfather 49  . Heart disease Paternal Grandfather   . Stroke Paternal Grandfather   . Heart disease Father   . Hypertension Mother        grandfather/uncles  . Diabetes Maternal Grandfather   . Cancer Sister        male  . Cancer Paternal Grandmother 60       stomach  . Breast cancer Neg Hx   . Prostate cancer Neg Hx     PMHx, SurgHx, SocialHx, FamHx, Medications, and Allergies were reviewed in the Visit  Navigator and updated as appropriate.   Patient Active Problem List   Diagnosis Date Noted  . Normocytic anemia 08/10/2017  . Hyperglycemia 08/10/2017  . Asthma 08/09/2017  . BMI 50.0-59.9, adult ([place]) 04/05/2016  . Hemorrhoids 04/05/2016  . Essential hypertension 04/05/2016  . Gastroesophageal reflux disease 04/05/2016  . Ventral hernia 12/24/2012  . Pain of left heel 11/24/2011  . OSA on CPAP 10/08/2011  . Leg swelling 10/07/2011  . Radicular pain in left arm 10/07/2011  . Obesity, unspecified 06/10/2007  . DEPRESSION 06/10/2007  . Allergic rhinitis 06/10/2007    Social History   Tobacco Use  . Smoking status: Former Smoker    Packs/day: 1.50    Years: 3.00    Pack years: 4.50    Types: Cigarettes    Last attempt to quit: 01/09/2004    Years since quitting: 13.6  . Smokeless tobacco: Never Used  Substance Use Topics  . Alcohol use: Yes    Comment: Occassional glass of wine  . Drug use: No    Current Medications and Allergies:    Current Outpatient Medications:  .  acetaminophen (TYLENOL) 500 MG tablet, Take 500 mg by mouth every 6 (six) hours as needed for mild pain. , Disp: , Rfl:  .  albuterol (PROVENTIL HFA;VENTOLIN HFA) 108 (90 Base) MCG/ACT inhaler, Inhale 2 puffs into the lungs every 6 (six) hours as needed for wheezing or shortness of breath., Disp: 1 Inhaler, Rfl: 0 .  amLODipine (NORVASC) 5 MG tablet, Take 1 tablet (5 mg total) by mouth daily., Disp: 90 tablet, Rfl: 0 .  fluticasone (FLONASE) 50 MCG/ACT nasal spray, Place 2 sprays into both nostrils daily. (Patient taking differently: Place 2 sprays into both nostrils daily as needed for allergies or rhinitis. ), Disp: 16 g, Rfl: 6 .  guaiFENesin (MUCINEX) 600 MG 12 hr tablet, Take 2 tablets (1,200 mg total) by mouth 2 (two) times daily., Disp: 20 tablet, Rfl: 0 .  ibuprofen (ADVIL,MOTRIN) 200 MG tablet, Take 400 mg by mouth every 6 (six) hours as needed for headache. , Disp: , Rfl:  .  ipratropium-albuterol  (DUONEB) 0.5-2.5 (3) MG/3ML SOLN, Take 3 mLs by nebulization every 6 (six) hours as needed., Disp: 360 mL, Rfl: 0 .  loratadine (CLARITIN) 10 MG tablet, Take 10 mg by mouth daily as needed for allergies. , Disp: , Rfl:  .  Multiple Vitamin (MULTIVITAMIN WITH MINERALS) TABS tablet, Take 1 tablet by mouth daily., Disp: , Rfl:  .  omeprazole (PRILOSEC OTC) 20 MG tablet, Take 20 mg by mouth daily. , Disp: , Rfl:  .  predniSONE (STERAPRED UNI-PAK 21 TAB) 10 MG (21) TBPK tablet, Take 6 tablets day 1, 5 tablets day 2, 4 tablets day 3, 3 tablets day 4, 2 tablets day 5, 1 tablet day 6 and stop on day 7, Disp: 21 tablet, Rfl: 0 .  furosemide (LASIX) 20 MG tablet, Take 1 tablet (20 mg total) by mouth daily as needed for edema., Disp: 30 tablet, Rfl: 3 .  potassium chloride SA (K-DUR,KLOR-CON) 20 MEQ tablet, Take 1 tablet daily when taking 1 tablet of lasix., Disp: 30 tablet, Rfl: 1 .  valsartan (DIOVAN) 40 MG tablet, Take 1 tablet (40 mg total) by mouth daily., Disp: 30 tablet, Rfl: 1   Allergies  Allergen Reactions  . Aleve [Naproxen] Other (See Comments)    Burning sensation and headaches  . Nickel Rash    Review of Systems   ROS  Negative unless otherwise specified per HPI.  Vitals:   Vitals:   08/16/17 0845  BP: 124/90  Pulse: 64  Temp: 98.7 F (37.1 C)  TempSrc: Oral  SpO2: 97%  Weight: (!) 414 lb (187.8 kg)  Height: 6' 0.05" (1.83 m)     Body mass index is 56.07 kg/m.   Physical Exam:    Physical Exam  Constitutional: He appears well-developed. He is cooperative.  Non-toxic appearance. He does not have a sickly appearance. He does not appear ill. No distress.  HENT:  Head: Normocephalic and atraumatic.  Right Ear: Tympanic membrane, external ear and ear canal normal. Tympanic membrane is not erythematous, not retracted and not bulging.  Left Ear: Tympanic membrane, external ear and ear canal normal. Tympanic membrane is not erythematous, not retracted and not bulging.  Nose:  Nose normal. Right sinus exhibits no maxillary sinus tenderness and no frontal sinus tenderness. Left sinus exhibits no maxillary sinus tenderness and no frontal sinus tenderness.  Mouth/Throat: Uvula is midline. No posterior oropharyngeal edema or posterior oropharyngeal erythema.  Eyes: Conjunctivae and lids are normal.  Neck: Trachea normal.  Cardiovascular: Normal rate, regular rhythm, S1 normal, S2 normal and normal heart sounds.  Pulmonary/Chest: Effort normal and breath sounds normal. He has no decreased breath sounds. He has no wheezes. He has no rhonchi. He has no rales.  Lymphadenopathy:    He has no cervical adenopathy.  Neurological: He is alert.  Skin: Skin is warm, dry and intact.  Psychiatric: He has a normal mood and affect. His speech is normal and behavior is normal.  Nursing note and vitals reviewed.    Assessment and Plan:    Aaron Frost was seen today for hospitalization follow-up.  Diagnoses and all orders for this visit:  SOB (shortness of breath) and Leg edema; Essential hypertension Symptoms have been overall improved, however they remain when he stairs.  He has an appointment with pulmonology this afternoon.  He is hoping that he will not require long-term use of in the near future, has an upcoming trip planned in September. Further management per pulmonology.  We will plan to obtain echo for further evaluation.  I think the leg edema is likely from amlodipine, not keeping legs elevated, and prednisone use.  We are going to change his amlodipine to valsartan.  I discussed keeping his legs elevated whenever possible.  I also provided him as needed Lasix  to help with swelling.  I recommended that he than 3 pounds overnight to give our office a call. Follow-up in 2 weeks with [place]. -     Comprehensive metabolic panel -     CBC -     Magnesium -     Phosphorus  Severe persistent asthma with exacerbation Further management per pulmonology -- has appointment today.  Other  orders -     valsartan (DIOVAN) 40 MG tablet; Take 1 tablet (40 mg total) by mouth daily. -     furosemide (LASIX) 20 MG tablet; Take 1 tablet (20 mg total) by mouth daily as needed for edema. -     potassium chloride SA (K-DUR,KLOR-CON) 20 MEQ tablet; Take 1 tablet daily when taking 1 tablet of lasix.  I filled out FMLA and short-term disability paperwork during today's visit.  . Reviewed expectations re: course of current medical issues. . Discussed self-management of symptoms. . Outlined signs and symptoms indicating need for more acute intervention. . Patient verbalized understanding and all questions were answered. . See orders for this visit as documented in the electronic medical record. . Patient received an After Visit Summary.  CMA or LPN served as scribe during this visit. History, Physical, and Plan performed by medical provider. Documentation and orders reviewed and attested to.  Inda Coke, PA-C [place] 08/16/2017  Follow-up: No follow-ups on file.

## 2017-08-16 NOTE — Patient Instructions (Addendum)
Recent hypoxemic respiratory failure Obstructive sleep apnea Asthma Environmental allergies  Obtain a CPAP titration study  Exercise oximetry to see if we can discontinue oxygen supplementation  We will start you on Singulair  We will see you back in the office in 3 months

## 2017-08-16 NOTE — Progress Notes (Signed)
Subjective:    Patient ID: Aaron Frost, male    DOB: 05/31/1980, 37 y.o.   MRN: [id number] Chief complaint: Recent hospitalization, history of obstructive sleep apnea has not been compliant recently Recent hypoxemic respiratory failure, pneumonia  HPI Multiple comorbidities on oxygen supplementation since recent hospitalization He has a history of obstructive sleep apnea was very compliant with CPAP use in the past but in the last few years has not been using it, he is trying to resume using CPAP but was not sure about his pressures He has been checking his oximetry at home and consistently runs in the high 90s, and has been taking it off occasionally and checked his oxygen-usually runs in the 90s  History of multiple allergies History of hypertension History of asthma History of depression, morbid obesity   Review of Systems  Constitutional: Negative for fever and unexpected weight change.  HENT: Negative for congestion, dental problem, ear pain, nosebleeds, postnasal drip, rhinorrhea, sinus pressure, sneezing, sore throat and trouble swallowing.   Eyes: Negative for redness and itching.  Respiratory: Positive for cough and shortness of breath. Negative for chest tightness and wheezing.   Cardiovascular: Negative for palpitations and leg swelling.  Gastrointestinal: Negative for nausea and vomiting.  Genitourinary: Negative for dysuria.  Musculoskeletal: Positive for joint swelling.  Skin: Negative for rash.  Allergic/Immunologic: Negative.  Negative for environmental allergies, food allergies and immunocompromised state.  Neurological: Negative for headaches.  Hematological: Does not bruise/bleed easily.  Psychiatric/Behavioral: Negative for dysphoric mood. The patient is not nervous/anxious.    Past Medical History:  Diagnosis Date  . Asthma 08/09/2017  . OSA on CPAP   . Umbilical hernia 6237   Social History   Socioeconomic History  . Marital status: Legally  Separated    Spouse name: Not on file  . Number of children: Not on file  . Years of education: Not on file  . Highest education level: Not on file  Occupational History  . Occupation: Research scientist (physical sciences): Sports coach  Social Needs  . Financial resource strain: Not on file  . Food insecurity:    Worry: Not on file    Inability: Not on file  . Transportation needs:    Medical: Not on file    Non-medical: Not on file  Tobacco Use  . Smoking status: Former Smoker    Packs/day: 1.50    Years: 3.00    Pack years: 4.50    Types: Cigarettes    Last attempt to quit: 01/09/2004    Years since quitting: 13.6  . Smokeless tobacco: Never Used  Substance and Sexual Activity  . Alcohol use: Yes    Comment: Occassional glass of wine  . Drug use: No  . Sexual activity: Never  Lifestyle  . Physical activity:    Days per week: Not on file    Minutes per session: Not on file  . Stress: Not on file  Relationships  . Social connections:    Talks on phone: Not on file    Gets together: Not on file    Attends religious service: Not on file    Active member of club or organization: Not on file    Attends meetings of clubs or organizations: Not on file    Relationship status: Not on file  . Intimate partner violence:    Fear of current or ex partner: Not on file    Emotionally abused: Not on file    Physically abused: Not on file  Forced sexual activity: Not on file  Other Topics Concern  . Not on file  Social History Narrative   Work for [employer] -- sedentary   Divorced, Dec 2015    Live in [place]   Fun: walk, watch TV   Current Outpatient Medications on File Prior to Visit  Medication Sig Dispense Refill  . acetaminophen (TYLENOL) 500 MG tablet Take 500 mg by mouth every 6 (six) hours as needed for mild pain.     Marland Kitchen albuterol (PROVENTIL HFA;VENTOLIN HFA) 108 (90 Base) MCG/ACT inhaler Inhale 2 puffs into the lungs every 6 (six) hours as needed for wheezing or  shortness of breath. 1 Inhaler 0  . amLODipine (NORVASC) 5 MG tablet Take 1 tablet (5 mg total) by mouth daily. 90 tablet 0  . fluticasone (FLONASE) 50 MCG/ACT nasal spray Place 2 sprays into both nostrils daily. (Patient taking differently: Place 2 sprays into both nostrils daily as needed for allergies or rhinitis. ) 16 g 6  . furosemide (LASIX) 20 MG tablet Take 1 tablet (20 mg total) by mouth daily as needed for edema. 30 tablet 3  . guaiFENesin (MUCINEX) 600 MG 12 hr tablet Take 2 tablets (1,200 mg total) by mouth 2 (two) times daily. 20 tablet 0  . ibuprofen (ADVIL,MOTRIN) 200 MG tablet Take 400 mg by mouth every 6 (six) hours as needed for headache.     . ipratropium-albuterol (DUONEB) 0.5-2.5 (3) MG/3ML SOLN Take 3 mLs by nebulization every 6 (six) hours as needed. 360 mL 0  . loratadine (CLARITIN) 10 MG tablet Take 10 mg by mouth daily as needed for allergies.     . Multiple Vitamin (MULTIVITAMIN WITH MINERALS) TABS tablet Take 1 tablet by mouth daily.    Marland Kitchen omeprazole (PRILOSEC OTC) 20 MG tablet Take 20 mg by mouth daily.     . potassium chloride SA (K-DUR,KLOR-CON) 20 MEQ tablet Take 1 tablet daily when taking 1 tablet of lasix. 30 tablet 1  . predniSONE (STERAPRED UNI-PAK 21 TAB) 10 MG (21) TBPK tablet Take 6 tablets day 1, 5 tablets day 2, 4 tablets day 3, 3 tablets day 4, 2 tablets day 5, 1 tablet day 6 and stop on day 7 21 tablet 0  . valsartan (DIOVAN) 40 MG tablet Take 1 tablet (40 mg total) by mouth daily. 30 tablet 1  . [DISCONTINUED] hydrochlorothiazide 25 MG tablet Take 1 tablet (25 mg total) by mouth daily. 30 tablet 11   No current facility-administered medications on file prior to visit.         Objective:   Physical Exam  Constitutional: He is oriented to person, place, and time. No distress.  Obese  HENT:  Head: Normocephalic and atraumatic.  Eyes: EOM are normal. Right eye exhibits no discharge.  Neck: Normal range of motion. Neck supple. No tracheal deviation  present. No thyromegaly present.  Cardiovascular: Normal rate, regular rhythm, normal heart sounds and intact distal pulses.  No murmur heard. Pulmonary/Chest: Effort normal and breath sounds normal. No stridor. No respiratory distress. He has no wheezes.  Abdominal: Soft. Bowel sounds are normal.  Musculoskeletal: Normal range of motion. He exhibits edema.  Neurological: He is alert and oriented to person, place, and time. No cranial nerve deficit. Coordination normal.  Skin: Skin is warm and dry. No rash noted. He is not diaphoretic. No erythema.  Psychiatric: He has a normal mood and affect. His behavior is normal.      Recent chest x-ray 08/11/2017 shows no  acute infiltrate Assessment & Plan:  Marland Kitchen  Hypoxemic respiratory failure  .  Morbid obesity  .  Asthma  .  Obstructive sleep apnea  .  Environmental allergies  Plan:  Exercise oximetry to see what can discontinue his oxygen supplementation  CPAP titration study  We will start you on Singulair for allergies  I will see you back in the office in 3 months

## 2017-08-20 ENCOUNTER — Telehealth: Payer: Self-pay | Admitting: *Deleted

## 2017-08-20 NOTE — Telephone Encounter (Signed)
Copied from [place] [phone]. Topic: General - Other >> Aug 20, 2017  8:15 AM Scherrie Gerlach wrote: Reason for CRM: pt states he was to let [facility] know if he lost any more than 3 lbs after starting the water pill on 8/11. Pt states he is down 4 lbs from yesterday and a total of 7 lbs altogether. Pt has not taken his pill today

## 2017-08-20 NOTE — Telephone Encounter (Signed)
If he still has significant fluid in his legs, he can continue to use his lasix daily. Otherwise, hold and use as needed.  From this point on, please notify us of any GAIN of 3 or more pounds in 1 day.  Thanks, Inda Coke PA-C

## 2017-08-20 NOTE — Telephone Encounter (Signed)
Please see message. °

## 2017-08-20 NOTE — Telephone Encounter (Signed)
Spoke to pt told him Aaron Frost said If you are still having significant fluid in your legs, you can continue to use lasix daily. Otherwise, hold and use as needed. From this point on, please notify us of any GAIN of 3 or more pounds in 1 day. Pt verbalized understanding.

## 2017-08-26 ENCOUNTER — Ambulatory Visit (INDEPENDENT_AMBULATORY_CARE_PROVIDER_SITE_OTHER): Payer: BLUE CROSS/BLUE SHIELD | Admitting: Physician Assistant

## 2017-08-26 ENCOUNTER — Ambulatory Visit: Payer: BLUE CROSS/BLUE SHIELD | Admitting: Physician Assistant

## 2017-08-26 ENCOUNTER — Encounter: Payer: Self-pay | Admitting: Physician Assistant

## 2017-08-26 VITALS — BP 118/80 | HR 70 | Temp 98.6°F | Ht 73.0 in | Wt >= 6400 oz

## 2017-08-26 DIAGNOSIS — I10 Essential (primary) hypertension: Secondary | ICD-10-CM

## 2017-08-26 MED ORDER — VALSARTAN 40 MG PO TABS: 40.0000 mg | ORAL_TABLET | Freq: Every day | ORAL | 1 refills | Status: DC

## 2017-08-26 NOTE — Progress Notes (Signed)
Aaron Frost is a 37 y.o. male is here to follow up on blood pressure medication change.  I acted as a Education administrator for [employer], PA-C [employer], LPN.  History of Present Illness:   Chief Complaint  Patient presents with  . Hypertension    Hypertension  This is a recurrent problem. Episode onset: Pt here for a follo up on blood pressure, medication was changed. Pt has been checking blood pressure for the past week and has been averagingh 119/78-134/87.  The problem has been gradually improving since onset. The problem is controlled. Pertinent negatives include no blurred vision, chest pain, headaches, peripheral edema or shortness of breath. Risk factors for coronary artery disease include obesity. Past treatments include diuretics. The current treatment provides moderate improvement.   Stopped Amlodopine. Only required Lasix 20 mg x 1. Swelling has had improvement.  Wt Readings from Last 5 Encounters:  08/26/17 (!) 405 lb 6.1 oz (183.9 kg)  08/16/17 (!) 414 lb (187.8 kg)  08/16/17 (!) 414 lb (187.8 kg)  08/09/17 (!) 414 lb 7.4 oz (188 kg)  08/09/17 (!) 414 lb (187.8 kg)     There are no preventive care reminders to display for this patient.  Past Medical History:  Diagnosis Date  . Asthma 08/09/2017  . OSA on CPAP   . Umbilical hernia [id number]     Social History   Socioeconomic History  . Marital status: Divorced    Spouse name: Not on file  . Number of children: Not on file  . Years of education: Not on file  . Highest education level: Not on file  Occupational History  . Occupation: Research scientist (physical sciences): Sports coach  Social Needs  . Financial resource strain: Not on file  . Food insecurity:    Worry: Not on file    Inability: Not on file  . Transportation needs:    Medical: Not on file    Non-medical: Not on file  Tobacco Use  . Smoking status: Former Smoker    Packs/day: 1.50    Years: 3.00    Pack years: 4.50    Types: Cigarettes    Last  attempt to quit: 01/09/2004    Years since quitting: 13.6  . Smokeless tobacco: Never Used  Substance and Sexual Activity  . Alcohol use: Yes    Comment: Occassional glass of wine  . Drug use: No  . Sexual activity: Never  Lifestyle  . Physical activity:    Days per week: Not on file    Minutes per session: Not on file  . Stress: Not on file  Relationships  . Social connections:    Talks on phone: Not on file    Gets together: Not on file    Attends religious service: Not on file    Active member of club or organization: Not on file    Attends meetings of clubs or organizations: Not on file    Relationship status: Not on file  . Intimate partner violence:    Fear of current or ex partner: Not on file    Emotionally abused: Not on file    Physically abused: Not on file    Forced sexual activity: Not on file  Other Topics Concern  . Not on file  Social History Narrative   Work for [employer] -- sedentary   Divorced, Dec 2015    Live in [place]: walk, watch TV    Past Surgical History:  Procedure  Laterality Date  . EXTERNAL EAR SURGERY     right ear reconstruction  . MOLE REMOVAL     [place] of Neck  . TONSILLECTOMY  2005    Family History  Problem Relation Age of Onset  . Colon cancer Paternal Grandfather 24  . Heart disease Paternal Grandfather   . Stroke Paternal Grandfather   . Heart disease Father   . Hypertension Mother        grandfather/uncles  . Diabetes Maternal Grandfather   . Cancer Sister        male  . Cancer Paternal Grandmother 7       stomach  . Breast cancer Neg Hx   . Prostate cancer Neg Hx     PMHx, SurgHx, SocialHx, FamHx, Medications, and Allergies were reviewed in the Visit Navigator and updated as appropriate.   Patient Active Problem List   Diagnosis Date Noted  . Normocytic anemia 08/10/2017  . Hyperglycemia 08/10/2017  . Asthma 08/09/2017  . BMI 50.0-59.9, adult ([place]) 04/05/2016  . Hemorrhoids 04/05/2016  .  Essential hypertension 04/05/2016  . Gastroesophageal reflux disease 04/05/2016  . Ventral hernia 12/24/2012  . Pain of left heel 11/24/2011  . OSA on CPAP 10/08/2011  . Leg swelling 10/07/2011  . Radicular pain in left arm 10/07/2011  . Obesity, unspecified 06/10/2007  . DEPRESSION 06/10/2007  . Allergic rhinitis 06/10/2007    Social History   Tobacco Use  . Smoking status: Former Smoker    Packs/day: 1.50    Years: 3.00    Pack years: 4.50    Types: Cigarettes    Last attempt to quit: 01/09/2004    Years since quitting: 13.6  . Smokeless tobacco: Never Used  Substance Use Topics  . Alcohol use: Yes    Comment: Occassional glass of wine  . Drug use: No    Current Medications and Allergies:    Current Outpatient Medications:  .  acetaminophen (TYLENOL) 500 MG tablet, Take 500 mg by mouth every 6 (six) hours as needed for mild pain. , Disp: , Rfl:  .  albuterol (PROVENTIL HFA;VENTOLIN HFA) 108 (90 Base) MCG/ACT inhaler, Inhale 2 puffs into the lungs every 6 (six) hours as needed for wheezing or shortness of breath., Disp: 1 Inhaler, Rfl: 0 .  fluticasone (FLONASE) 50 MCG/ACT nasal spray, Place 2 sprays into both nostrils daily. (Patient taking differently: Place 2 sprays into both nostrils daily as needed for allergies or rhinitis. ), Disp: 16 g, Rfl: 6 .  furosemide (LASIX) 20 MG tablet, Take 1 tablet (20 mg total) by mouth daily as needed for edema., Disp: 30 tablet, Rfl: 3 .  ibuprofen (ADVIL,MOTRIN) 200 MG tablet, Take 400 mg by mouth every 6 (six) hours as needed for headache. , Disp: , Rfl:  .  ipratropium-albuterol (DUONEB) 0.5-2.5 (3) MG/3ML SOLN, Take 3 mLs by nebulization every 6 (six) hours as needed., Disp: 360 mL, Rfl: 0 .  loratadine (CLARITIN) 10 MG tablet, Take 10 mg by mouth daily as needed for allergies. , Disp: , Rfl:  .  montelukast (SINGULAIR) 10 MG tablet, Take 1 tablet (10 mg total) by mouth at bedtime., Disp: 30 tablet, Rfl: 6 .  Multiple Vitamin  (MULTIVITAMIN WITH MINERALS) TABS tablet, Take 1 tablet by mouth daily., Disp: , Rfl:  .  omeprazole (PRILOSEC OTC) 20 MG tablet, Take 20 mg by mouth daily. , Disp: , Rfl:  .  potassium chloride SA (K-DUR,KLOR-CON) 20 MEQ tablet, Take 1 tablet daily when taking 1 tablet  of lasix., Disp: 30 tablet, Rfl: 1 .  valsartan (DIOVAN) 40 MG tablet, Take 1 tablet (40 mg total) by mouth daily., Disp: 30 tablet, Rfl: 1   Allergies  Allergen Reactions  . Aleve [Naproxen] Other (See Comments)    Burning sensation and headaches  . Nickel Rash    Review of Systems   Review of Systems  Eyes: Negative for blurred vision.  Respiratory: Negative for shortness of breath.   Cardiovascular: Negative for chest pain.  Neurological: Negative for headaches.    Vitals:   Vitals:   08/26/17 0740  BP: 118/80  Pulse: 70  Temp: 98.6 F (37 C)  TempSrc: Oral  SpO2: 97%  Weight: (!) 405 lb 6.1 oz (183.9 kg)  Height: 6\' 1"  (1.854 m)     Body mass index is 53.48 kg/m.   Physical Exam:    Physical Exam  Constitutional: He appears well-developed. He is cooperative.  Non-toxic appearance. He does not have a sickly appearance. He does not appear ill. No distress.  Cardiovascular: Normal rate, regular rhythm, S1 normal, S2 normal, normal heart sounds and normal pulses.  Trace bilateral LE edema  Pulmonary/Chest: Effort normal and breath sounds normal.  Neurological: He is alert. GCS eye subscore is 4. GCS verbal subscore is 5. GCS motor subscore is 6.  Skin: Skin is warm, dry and intact.  Psychiatric: He has a normal mood and affect. His speech is normal and behavior is normal.  Nursing note and vitals reviewed.    Assessment and Plan:    Problem List Items Addressed This Visit      Cardiovascular and Mediastinum   Essential hypertension - Primary    Tolerating blood pressure change well. Minimal fluid. Continue Valsartan 40 mg daily. Follow-up in 1 month for CPE.      Relevant Medications    valsartan (DIOVAN) 40 MG tablet       . Reviewed expectations re: course of current medical issues. . Discussed self-management of symptoms. . Outlined signs and symptoms indicating need for more acute intervention. . Patient verbalized understanding and all questions were answered. . See orders for this visit as documented in the electronic medical record. . Patient received an After Visit Summary.  CMA or LPN served as scribe during this visit. History, Physical, and Plan performed by medical provider. Documentation and orders reviewed and attested to.   Inda Coke, PA-C [place], [place] 08/26/2017  Follow-up: No follow-ups on file.

## 2017-08-26 NOTE — Assessment & Plan Note (Addendum)
Tolerating blood pressure change well. Minimal fluid. Continue Valsartan 40 mg daily. Follow-up in 1 month for CPE.

## 2017-08-28 ENCOUNTER — Telehealth: Payer: Self-pay | Admitting: *Deleted

## 2017-08-28 NOTE — Telephone Encounter (Signed)
See note

## 2017-08-28 NOTE — Telephone Encounter (Signed)
Left message on voicemail to call office.  

## 2017-08-28 NOTE — Telephone Encounter (Signed)
Pt returned call

## 2017-08-28 NOTE — Telephone Encounter (Signed)
Copied from [facility] [phone]. Topic: General - Other >> Aug 27, 2017  1:12 PM Synthia Innocent wrote: Reason for CRM: FMLA was completed incorrectly, patient works from home. Only need work excuse for 08/05/17, 07/12/17-07/16/17. Would like call back. >> Aug 28, 2017  1:18 PM Hewitt Shorts wrote: Pt is calling to check on status of the FMLA paper work corrections and to make sure that the dates 02/02/17 and 08/09/2017 is in the [employer] number [phone]

## 2017-08-29 NOTE — Telephone Encounter (Signed)
Called patient and wanted clarity of the dates he needs for his FMLA paperwork. Patient stated that he missed work from 07/31/2017-08/02/2017 and returned to work on 08/05/2017. He also missed work due to his appointment on 08/09/2017 but that's the only days he has missed. He didn't mis everyday from 07/31/17-08/12/17 that's on the paperwork. I informed him to have his [place] to refax the papers to [place] to correct the information on them. Patient verbalized understanding.

## 2017-08-30 ENCOUNTER — Ambulatory Visit (INDEPENDENT_AMBULATORY_CARE_PROVIDER_SITE_OTHER): Payer: BLUE CROSS/BLUE SHIELD | Admitting: Nurse Practitioner

## 2017-08-30 ENCOUNTER — Encounter: Payer: Self-pay | Admitting: Nurse Practitioner

## 2017-08-30 DIAGNOSIS — G4733 Obstructive sleep apnea (adult) (pediatric): Secondary | ICD-10-CM

## 2017-08-30 DIAGNOSIS — J45901 Unspecified asthma with (acute) exacerbation: Secondary | ICD-10-CM | POA: Diagnosis not present

## 2017-08-30 DIAGNOSIS — J302 Other seasonal allergic rhinitis: Secondary | ICD-10-CM | POA: Diagnosis not present

## 2017-08-30 DIAGNOSIS — J9601 Acute respiratory failure with hypoxia: Secondary | ICD-10-CM | POA: Insufficient documentation

## 2017-08-30 DIAGNOSIS — Z9989 Dependence on other enabling machines and devices: Secondary | ICD-10-CM

## 2017-08-30 NOTE — Assessment & Plan Note (Signed)
Patient Instructions  OK not to wear O2 Please keep a close check on O2 sats Please call with any drops in sats requiring O2 Continue to exercise as tolerated Continue voice training exercises Follow up with Dr. Ander Slade in 3 months

## 2017-08-30 NOTE — Assessment & Plan Note (Signed)
Patient is compliant with CPAP Continue at current settings CPAP titration has been ordered

## 2017-08-30 NOTE — Assessment & Plan Note (Signed)
Continue current medications. 

## 2017-08-30 NOTE — Progress Notes (Signed)
@Patient  ID: Aaron Frost, male    DOB: February 04, 1980, 37 y.o.   MRN: [id number]  Chief Complaint  Patient presents with  . Follow-up    Does not need O2 any longer    Referring provider: Inda Coke, PA  HPI   37 year old male with hypoxemic respiratory failure, morbid obesity, asthma, OSA, and environmental allergies. Followed by Dr. Ander Slade.    OV 08-30-17 Hypoxemic respiratory failure and OSA and allergies Patient presents today for a check in on O2 levels. He has not used his oxygen since Saturday. He states that he has been doing really well since last visit. He is working on being more active and will start back vocal/breathing training with singing. Patient has a pulse ox at home and can check his pulse ox frequently. States that his O2 sats have been stable in the upper 90's without O2. Today he was walked in the office and his sats remained in the upper 90's while ambulating on RA. Denies any shortness of breath or wheezing. He is compliant with CPAP at night. He states that he was started on Singulair at last visit and he has been compliant with the medication and has had significant improvement since he started it.       Allergies  Allergen Reactions  . Aleve [Naproxen] Other (See Comments)    Burning sensation and headaches  . Nickel Rash    Immunization History  Administered Date(s) Administered  . Influenza Inj Mdck Quad Pf 11/02/2016  . Influenza Split 10/27/2011  . Influenza-Unspecified 10/08/2012  . Tdap 03/16/2011    Past Medical History:  Diagnosis Date  . Asthma 08/09/2017  . OSA on CPAP   . Umbilical hernia 7741    Tobacco History: Social History   Tobacco Use  Smoking Status Former Smoker  . Packs/day: 1.50  . Years: 3.00  . Pack years: 4.50  . Types: Cigarettes  . Last attempt to quit: 01/09/2004  . Years since quitting: 13.6  Smokeless Tobacco Never Used   Counseling given: Yes   Outpatient Encounter Medications as of  08/30/2017  Medication Sig  . acetaminophen (TYLENOL) 500 MG tablet Take 500 mg by mouth every 6 (six) hours as needed for mild pain.   Marland Kitchen albuterol (PROVENTIL HFA;VENTOLIN HFA) 108 (90 Base) MCG/ACT inhaler Inhale 2 puffs into the lungs every 6 (six) hours as needed for wheezing or shortness of breath.  . fluticasone (FLONASE) 50 MCG/ACT nasal spray Place 2 sprays into both nostrils daily. (Patient taking differently: Place 2 sprays into both nostrils daily as needed for allergies or rhinitis. )  . furosemide (LASIX) 20 MG tablet Take 1 tablet (20 mg total) by mouth daily as needed for edema.  Marland Kitchen ibuprofen (ADVIL,MOTRIN) 200 MG tablet Take 400 mg by mouth every 6 (six) hours as needed for headache.   . ipratropium-albuterol (DUONEB) 0.5-2.5 (3) MG/3ML SOLN Take 3 mLs by nebulization every 6 (six) hours as needed.  . loratadine (CLARITIN) 10 MG tablet Take 10 mg by mouth daily as needed for allergies.   . montelukast (SINGULAIR) 10 MG tablet Take 1 tablet (10 mg total) by mouth at bedtime.  . Multiple Vitamin (MULTIVITAMIN WITH MINERALS) TABS tablet Take 1 tablet by mouth daily.  Marland Kitchen omeprazole (PRILOSEC OTC) 20 MG tablet Take 20 mg by mouth daily.   . potassium chloride SA (K-DUR,KLOR-CON) 20 MEQ tablet Take 1 tablet daily when taking 1 tablet of lasix.  Marland Kitchen valsartan (DIOVAN) 40 MG tablet Take 1 tablet (  40 mg total) by mouth daily.  . [DISCONTINUED] hydrochlorothiazide 25 MG tablet Take 1 tablet (25 mg total) by mouth daily.   No facility-administered encounter medications on file as of 08/30/2017.      Review of Systems  Review of Systems  Constitutional: Negative.   HENT: Negative.   Respiratory: Negative for cough and shortness of breath.   Cardiovascular: Negative.   Gastrointestinal: Negative.   Allergic/Immunologic: Negative.   Neurological: Negative.   Psychiatric/Behavioral: Negative.        Physical Exam  BP 130/76 (BP Location: Right Arm, Patient Position: Sitting, Cuff Size:  Large)   Pulse 67   Ht 6\' 1"  (1.854 m)   Wt (!) 405 lb 6.4 oz (183.9 kg)   SpO2 98%   BMI 53.49 kg/m   Wt Readings from Last 5 Encounters:  08/30/17 (!) 405 lb 6.4 oz (183.9 kg)  08/26/17 (!) 405 lb 6.1 oz (183.9 kg)  08/16/17 (!) 414 lb (187.8 kg)  08/16/17 (!) 414 lb (187.8 kg)  08/09/17 (!) 414 lb 7.4 oz (188 kg)     Physical Exam  Constitutional: He is oriented to person, place, and time. He appears well-developed and well-nourished. No distress.  obese  HENT:  MP 3 airway  Cardiovascular: Normal rate and regular rhythm.  Pulmonary/Chest: Effort normal and breath sounds normal.  Neurological: He is Frost and oriented to person, place, and time.  Skin: Skin is warm and dry.  Psychiatric: He has a normal mood and affect.  Nursing note and vitals reviewed.  Imaging: Dg Chest 2 View  Result Date: 08/11/2017 CLINICAL DATA:  Shortness of breath.  Hypoxia.  Pneumonia. EXAM: CHEST - 2 VIEW COMPARISON:  08/09/2017 FINDINGS: The heart size and mediastinal contours are within normal limits. Both lungs are clear. The visualized skeletal structures are unremarkable. IMPRESSION: Stable exam.  No active cardiopulmonary disease. Electronically Signed   By: Earle Gell M.D.   On: 08/11/2017 07:56   Dg Chest 2 View  Result Date: 08/09/2017 CLINICAL DATA:  Short of breath EXAM: CHEST - 2 VIEW COMPARISON:  07/31/2017 FINDINGS: The heart size and mediastinal contours are within normal limits. Both lungs are clear. The visualized skeletal structures are unremarkable. IMPRESSION: No active cardiopulmonary disease. Electronically Signed   By: Franchot Gallo M.D.   On: 08/09/2017 11:16     Assessment & Plan:   Allergic rhinitis Continue current medications  OSA on CPAP Patient is compliant with CPAP Continue at current settings CPAP titration has been ordered   Asthma Continue current mediactions  Acute hypoxemic respiratory failure ([place]) Patient Instructions  OK not to wear  O2 Please keep a close check on O2 sats Please call with any drops in sats requiring O2 Continue to exercise as tolerated Continue voice training exercises Follow up with Dr. Ander Slade in 3 months       Fenton Foy, NP 08/30/2017

## 2017-08-30 NOTE — Patient Instructions (Signed)
OK not to wear O2 Please keep a close check on O2 sats Please call with any drops in sats requiring O2 Continue to exercise as tolerated Continue voice training exercises Follow up with Dr. Ander Slade in 3 months

## 2017-08-30 NOTE — Assessment & Plan Note (Signed)
Continue current mediactions

## 2017-09-02 ENCOUNTER — Telehealth: Payer: Self-pay | Admitting: General Surgery

## 2017-09-02 ENCOUNTER — Encounter: Payer: Self-pay | Admitting: Nurse Practitioner

## 2017-09-02 NOTE — Addendum Note (Signed)
Addended by: Nena Polio on: 09/02/2017 11:42 AM   Modules accepted: Orders

## 2017-09-06 NOTE — Telephone Encounter (Signed)
Patient traveling out of town for a week per conversation during last office visit. Patient will not be back in town until week of 09/10/17.

## 2017-09-12 DIAGNOSIS — G4733 Obstructive sleep apnea (adult) (pediatric): Secondary | ICD-10-CM | POA: Diagnosis not present

## 2017-09-18 ENCOUNTER — Encounter (HOSPITAL_BASED_OUTPATIENT_CLINIC_OR_DEPARTMENT_OTHER): Payer: BLUE CROSS/BLUE SHIELD

## 2017-09-30 NOTE — Telephone Encounter (Signed)
See appointment tab. Patient was scheduled for sleep study 09/18/17 and cancelled, notes indicate the patient will call back to reschedule.

## 2017-10-14 DIAGNOSIS — G4733 Obstructive sleep apnea (adult) (pediatric): Secondary | ICD-10-CM | POA: Diagnosis not present

## 2017-10-18 ENCOUNTER — Ambulatory Visit (INDEPENDENT_AMBULATORY_CARE_PROVIDER_SITE_OTHER): Payer: BLUE CROSS/BLUE SHIELD | Admitting: Nurse Practitioner

## 2017-10-18 ENCOUNTER — Encounter: Payer: Self-pay | Admitting: Nurse Practitioner

## 2017-10-18 ENCOUNTER — Ambulatory Visit (INDEPENDENT_AMBULATORY_CARE_PROVIDER_SITE_OTHER)
Admission: RE | Admit: 2017-10-18 | Discharge: 2017-10-18 | Disposition: A | Discharge status: Home / Self Care | Payer: BLUE CROSS/BLUE SHIELD | Source: Ambulatory Visit | Attending: Nurse Practitioner | Admitting: Nurse Practitioner

## 2017-10-18 VITALS — BP 130/80 | HR 86 | Ht 73.0 in | Wt >= 6400 oz

## 2017-10-18 DIAGNOSIS — J45901 Unspecified asthma with (acute) exacerbation: Secondary | ICD-10-CM | POA: Diagnosis not present

## 2017-10-18 DIAGNOSIS — R05 Cough: Secondary | ICD-10-CM | POA: Diagnosis not present

## 2017-10-18 NOTE — Progress Notes (Signed)
@Patient  ID: Aaron Frost, male    DOB: [birth year], 37 y.o.   MRN: [id number]  Chief Complaint  Patient presents with  . Wheezing    with yellow congestion    Referring provider: Inda Coke, PA  HPI 37 year old male with hypoxemic respiratory failure, morbid obesity, asthma, OSA, and environmental allergies followed by Dr. Ander Slade.   OV 10/21/17 - acute - wheezing/cough Patient presents with wheezing and cough that started 1 week ago. He feels that symptoms are progressively worsening. He is compliant with duoneb and Singulair. His O2 sats have been 96% or higher. He has not been wearing his oxygen. His cough is productive of yellow sputum. He denies any chest pain, shortness of breath, or fever.    Allergies  Allergen Reactions  . Aleve [Naproxen] Other (See Comments)    Burning sensation and headaches  . Nickel Rash    Immunization History  Administered Date(s) Administered  . Influenza Inj Mdck Quad Pf 11/02/2016  . Influenza Split 10/27/2011  . Influenza-Unspecified 10/08/2012  . Tdap 03/16/2011    Past Medical History:  Diagnosis Date  . Asthma 08/09/2017  . OSA on CPAP   . Umbilical hernia 1749    Tobacco History: Social History   Tobacco Use  Smoking Status Former Smoker  . Packs/day: 1.50  . Years: 3.00  . Pack years: 4.50  . Types: Cigarettes  . Last attempt to quit: 01/09/2004  . Years since quitting: 13.7  Smokeless Tobacco Never Used   Counseling given: Yes   Outpatient Encounter Medications as of 10/18/2017  Medication Sig  . acetaminophen (TYLENOL) 500 MG tablet Take 500 mg by mouth every 6 (six) hours as needed for mild pain.   Marland Kitchen albuterol (PROVENTIL HFA;VENTOLIN HFA) 108 (90 Base) MCG/ACT inhaler Inhale 2 puffs into the lungs every 6 (six) hours as needed for wheezing or shortness of breath.  . fluticasone (FLONASE) 50 MCG/ACT nasal spray Place 2 sprays into both nostrils daily. (Patient taking differently: Place 2 sprays into  both nostrils daily as needed for allergies or rhinitis. )  . furosemide (LASIX) 20 MG tablet Take 1 tablet (20 mg total) by mouth daily as needed for edema.  Marland Kitchen ibuprofen (ADVIL,MOTRIN) 200 MG tablet Take 400 mg by mouth every 6 (six) hours as needed for headache.   . ipratropium-albuterol (DUONEB) 0.5-2.5 (3) MG/3ML SOLN Take 3 mLs by nebulization every 6 (six) hours as needed.  . montelukast (SINGULAIR) 10 MG tablet Take 1 tablet (10 mg total) by mouth at bedtime.  . Multiple Vitamin (MULTIVITAMIN WITH MINERALS) TABS tablet Take 1 tablet by mouth daily.  Marland Kitchen omeprazole (PRILOSEC OTC) 20 MG tablet Take 20 mg by mouth daily.   . potassium chloride SA (K-DUR,KLOR-CON) 20 MEQ tablet Take 1 tablet daily when taking 1 tablet of lasix.  Marland Kitchen valsartan (DIOVAN) 40 MG tablet Take 1 tablet (40 mg total) by mouth daily.  Marland Kitchen levofloxacin (LEVAQUIN) 500 MG tablet Take 1 tablet (500 mg total) by mouth daily.  . [DISCONTINUED] hydrochlorothiazide 25 MG tablet Take 1 tablet (25 mg total) by mouth daily.  . [DISCONTINUED] loratadine (CLARITIN) 10 MG tablet Take 10 mg by mouth daily as needed for allergies.    No facility-administered encounter medications on file as of 10/18/2017.      Review of Systems  Review of Systems  Constitutional: Negative.  Negative for chills and fever.  HENT: Positive for congestion. Negative for sinus pressure and sinus pain.   Respiratory: Positive for cough  and wheezing. Negative for shortness of breath.   Cardiovascular: Negative.  Negative for chest pain, palpitations and leg swelling.  Gastrointestinal: Negative.   Allergic/Immunologic: Negative.   Neurological: Negative.   Psychiatric/Behavioral: Negative.        Physical Exam  BP 130/80 (BP Location: Right Arm, Patient Position: Sitting, Cuff Size: Large)   Pulse 86   Ht 6\' 1"  (1.854 m)   Wt (!) 428 lb 6.4 oz (194.3 kg)   SpO2 96%   BMI 56.52 kg/m   Wt Readings from Last 5 Encounters:  10/18/17 (!) 428 lb 6.4  oz (194.3 kg)  08/30/17 (!) 405 lb 6.4 oz (183.9 kg)  08/26/17 (!) 405 lb 6.1 oz (183.9 kg)  08/16/17 (!) 414 lb (187.8 kg)  08/16/17 (!) 414 lb (187.8 kg)     Physical Exam  Constitutional: He is oriented to person, place, and time. He appears well-developed and well-nourished. No distress.  Cardiovascular: Normal rate and regular rhythm.  Pulmonary/Chest: Effort normal. No respiratory distress. He has wheezes. He has no rales.  Musculoskeletal: He exhibits no edema.  Neurological: He is alert and oriented to person, place, and time.  Skin: Skin is warm and dry.  Psychiatric: He has a normal mood and affect.  Nursing note and vitals reviewed.    Imaging: Dg Chest 2 View  Result Date: 10/18/2017 CLINICAL DATA:  Productive cough EXAM: CHEST - 2 VIEW COMPARISON:  08/11/2017 FINDINGS: Normal heart size. Negative mediastinal contours. Suspect central bronchitic airway thickening. There is no edema, consolidation, effusion, or pneumothorax. No acute osseous finding. IMPRESSION: Suspect bronchitic airway thickening.  No collapse or consolidation. Electronically Signed   By: Monte Fantasia M.D.   On: 10/18/2017 16:50     Assessment & Plan:  Asthma with acute exacerbation, unspecified asthma severity, unspecified whether persistent - Plan: DG Chest 2 View   Asthma Patient Instructions  Will check chest x ray Will call with results Please use duonebs every 6 hours as needed Continue all other medications Follow up with Dr. Ander Slade in 3-4 weeks or sooner if needed       Fenton Foy, NP 10/21/2017

## 2017-10-18 NOTE — Patient Instructions (Addendum)
Will check chest x ray Will call with results Please use duonebs every 6 hours as needed Continue all other medications Follow up with Dr. Ander Slade in 3-4 weeks or sooner if needed

## 2017-10-21 ENCOUNTER — Encounter: Payer: Self-pay | Admitting: Nurse Practitioner

## 2017-10-21 MED ORDER — LEVOFLOXACIN 500 MG PO TABS: 500.0000 mg | ORAL_TABLET | Freq: Every day | ORAL | 0 refills | Status: DC

## 2017-10-21 NOTE — Assessment & Plan Note (Signed)
Patient Instructions  Will check chest x ray Will call with results Please use duonebs every 6 hours as needed Continue all other medications Follow up with Dr. Ander Slade in 3-4 weeks or sooner if needed

## 2017-11-15 ENCOUNTER — Ambulatory Visit: Payer: BLUE CROSS/BLUE SHIELD | Admitting: Pulmonary Disease

## 2017-12-12 ENCOUNTER — Encounter: Payer: Self-pay | Admitting: Pulmonary Disease

## 2017-12-12 ENCOUNTER — Ambulatory Visit (INDEPENDENT_AMBULATORY_CARE_PROVIDER_SITE_OTHER): Payer: BLUE CROSS/BLUE SHIELD | Admitting: Pulmonary Disease

## 2017-12-12 VITALS — BP 128/72 | HR 77 | Ht 73.0 in | Wt >= 6400 oz

## 2017-12-12 DIAGNOSIS — J9611 Chronic respiratory failure with hypoxia: Secondary | ICD-10-CM

## 2017-12-12 NOTE — Patient Instructions (Signed)
Obstructive sleep apnea-compliant with CPAP use  Recent pneumonia for which she was started on oxygen at night  By your request, we will discontinue oxygen supplementation  Kindly bring your CPAP machine card in the next time you come in so that we can get a download from the machine to make sure it is working well  I will see you back in the office in about 3 months  Call with any other significant concerns

## 2017-12-12 NOTE — Progress Notes (Signed)
Subjective:    Patient ID: Aaron Frost, male    DOB: 19-Feb-1980, 37 y.o.   MRN: [id number]  Chief complaint: Recent hospitalization, history of obstructive sleep apnea has not been compliant recently Recent hypoxemic respiratory failure, pneumonia Symptoms have largely resolved  HPI Multiple comorbidities on oxygen supplementation since recent hospitalization-has not been using oxygen, feels he does not need it any longer He has a history of obstructive sleep apnea was very compliant with CPAP, as resumed using CPAP on a regular basis, has no concerns with the pressure settings  This is his oxygen he has consistently run in the 90s and he has not been using oxygen supplementation  History of multiple allergies History of hypertension History of asthma History of depression, morbid obesity   Review of Systems  Constitutional: Negative for fever and unexpected weight change.  Eyes: Negative for redness and itching.  Respiratory: Positive for cough. Negative for shortness of breath.   Genitourinary: Negative for dysuria.  Musculoskeletal: Positive for joint swelling.  Skin: Negative for rash.  Allergic/Immunologic: Negative.   Neurological: Negative for headaches.  Hematological: Does not bruise/bleed easily.  Psychiatric/Behavioral: The patient is not nervous/anxious.    Past Medical History:  Diagnosis Date  . Asthma 08/09/2017  . OSA on CPAP   . Umbilical hernia 4098   Social History   Socioeconomic History  . Marital status: Divorced    Spouse name: Not on file  . Number of children: Not on file  . Years of education: Not on file  . Highest education level: Not on file  Occupational History  . Occupation: Research scientist (physical sciences): Sports coach  Social Needs  . Financial resource strain: Not on file  . Food insecurity:    Worry: Not on file    Inability: Not on file  . Transportation needs:    Medical: Not on file    Non-medical: Not on file  Tobacco  Use  . Smoking status: Former Smoker    Packs/day: 1.50    Years: 3.00    Pack years: 4.50    Types: Cigarettes    Last attempt to quit: 01/09/2004    Years since quitting: 13.9  . Smokeless tobacco: Never Used  Substance and Sexual Activity  . Alcohol use: Yes    Comment: Occassional glass of wine  . Drug use: No  . Sexual activity: Never  Lifestyle  . Physical activity:    Days per week: Not on file    Minutes per session: Not on file  . Stress: Not on file  Relationships  . Social connections:    Talks on phone: Not on file    Gets together: Not on file    Attends religious service: Not on file    Active member of club or organization: Not on file    Attends meetings of clubs or organizations: Not on file    Relationship status: Not on file  . Intimate partner violence:    Fear of current or ex partner: Not on file    Emotionally abused: Not on file    Physically abused: Not on file    Forced sexual activity: Not on file  Other Topics Concern  . Not on file  Social History Narrative   Work for [employer] -- sedentary   Divorced, Dec 2015    Live in [place]   Fun: walk, watch TV   Current Outpatient Medications on File Prior to Visit  Medication Sig Dispense Refill  .  acetaminophen (TYLENOL) 500 MG tablet Take 500 mg by mouth every 6 (six) hours as needed for mild pain.     Marland Kitchen albuterol (PROVENTIL HFA;VENTOLIN HFA) 108 (90 Base) MCG/ACT inhaler Inhale 2 puffs into the lungs every 6 (six) hours as needed for wheezing or shortness of breath. 1 Inhaler 0  . fluticasone (FLONASE) 50 MCG/ACT nasal spray Place 2 sprays into both nostrils daily. (Patient taking differently: Place 2 sprays into both nostrils daily as needed for allergies or rhinitis. ) 16 g 6  . furosemide (LASIX) 20 MG tablet Take 1 tablet (20 mg total) by mouth daily as needed for edema. 30 tablet 3  . ibuprofen (ADVIL,MOTRIN) 200 MG tablet Take 400 mg by mouth every 6 (six) hours as needed for  headache.     . ipratropium-albuterol (DUONEB) 0.5-2.5 (3) MG/3ML SOLN Take 3 mLs by nebulization every 6 (six) hours as needed. 360 mL 0  . levofloxacin (LEVAQUIN) 500 MG tablet Take 1 tablet (500 mg total) by mouth daily. 5 tablet 0  . montelukast (SINGULAIR) 10 MG tablet Take 1 tablet (10 mg total) by mouth at bedtime. 30 tablet 6  . Multiple Vitamin (MULTIVITAMIN WITH MINERALS) TABS tablet Take 1 tablet by mouth daily.    Marland Kitchen omeprazole (PRILOSEC OTC) 20 MG tablet Take 20 mg by mouth daily.     . potassium chloride SA (K-DUR,KLOR-CON) 20 MEQ tablet Take 1 tablet daily when taking 1 tablet of lasix. 30 tablet 1  . valsartan (DIOVAN) 40 MG tablet Take 1 tablet (40 mg total) by mouth daily. 30 tablet 1  . [DISCONTINUED] hydrochlorothiazide 25 MG tablet Take 1 tablet (25 mg total) by mouth daily. 30 tablet 11   No current facility-administered medications on file prior to visit.         Objective:   Physical Exam  Constitutional: No distress.  Obese  HENT:  Head: Normocephalic and atraumatic.  Eyes: EOM are normal. Right eye exhibits no discharge.  Neck: Normal range of motion. Neck supple. No tracheal deviation present. No thyromegaly present.  Cardiovascular: Normal rate and regular rhythm.  No murmur heard. Pulmonary/Chest: Effort normal and breath sounds normal. No stridor. No respiratory distress. He has no wheezes. He has no rales.  Abdominal: Soft. Bowel sounds are normal.  Skin: He is not diaphoretic.  Psychiatric: He has a normal mood and affect. His behavior is normal.      Recent chest x-ray 08/11/2017-reviewed, no acute infiltrate  Assessment & Plan:  Marland Kitchen  Hypoxemic respiratory failure -Has resolved  .  Morbid obesity -Working on weight loss  .  Asthma -Has had stable symptoms  .  Obstructive sleep apnea -Compliant with CPAP use  .  Environmental allergies  Nocturnal hypoxemia Patient states he has stopped using his oxygen in the last few weeks he does not want  to continue using it Declines to have an oximetry performed to ascertain whether he still has desaturations  Plan:  Continue CPAP use  I encouraged him to bring his smartcard to the office during the next appointment in about 3 months that we can download the card and ascertain that the pressure is appropriate and efficient in treating his sleep disordered breathing  Will discontinue oxygen supplementation per patient request, aware of risks of not supplementing oxygen if needed

## 2017-12-26 ENCOUNTER — Ambulatory Visit (INDEPENDENT_AMBULATORY_CARE_PROVIDER_SITE_OTHER): Payer: BLUE CROSS/BLUE SHIELD | Admitting: Physician Assistant

## 2017-12-26 ENCOUNTER — Encounter: Payer: Self-pay | Admitting: Physician Assistant

## 2017-12-26 ENCOUNTER — Other Ambulatory Visit: Payer: Self-pay | Admitting: *Deleted

## 2017-12-26 VITALS — BP 130/86 | HR 76 | Temp 98.7°F | Ht 73.0 in | Wt >= 6400 oz

## 2017-12-26 DIAGNOSIS — Z0001 Encounter for general adult medical examination with abnormal findings: Secondary | ICD-10-CM | POA: Diagnosis not present

## 2017-12-26 DIAGNOSIS — Z114 Encounter for screening for human immunodeficiency virus [HIV]: Secondary | ICD-10-CM | POA: Diagnosis not present

## 2017-12-26 DIAGNOSIS — Z136 Encounter for screening for cardiovascular disorders: Secondary | ICD-10-CM

## 2017-12-26 DIAGNOSIS — E669 Obesity, unspecified: Secondary | ICD-10-CM

## 2017-12-26 DIAGNOSIS — H1033 Unspecified acute conjunctivitis, bilateral: Secondary | ICD-10-CM

## 2017-12-26 DIAGNOSIS — Z13220 Encounter for screening for lipoid disorders: Secondary | ICD-10-CM | POA: Diagnosis not present

## 2017-12-26 DIAGNOSIS — Z6841 Body Mass Index (BMI) 40.0 and over, adult: Secondary | ICD-10-CM

## 2017-12-26 DIAGNOSIS — E8881 Metabolic syndrome: Secondary | ICD-10-CM | POA: Diagnosis not present

## 2017-12-26 DIAGNOSIS — E88819 Insulin resistance, unspecified: Secondary | ICD-10-CM

## 2017-12-26 LAB — LIPID PANEL
CHOL/HDL RATIO: 4
Cholesterol: 173 mg/dL (ref 0–200)
HDL: 42.5 mg/dL (ref >39.00)
LDL Cholesterol: 114 mg/dL — ABNORMAL HIGH (ref 0–99)
NONHDL: 130.79
Triglycerides: 86 mg/dL (ref 0.0–149.0)
VLDL: 17.2 mg/dL (ref 0.0–40.0)

## 2017-12-26 LAB — COMPREHENSIVE METABOLIC PANEL
ALK PHOS: 67 U/L (ref 39–117)
ALT: 37 U/L (ref 0–53)
AST: 22 U/L (ref 0–37)
Albumin: 4.3 g/dL (ref 3.5–5.2)
BILIRUBIN TOTAL: 0.5 mg/dL (ref 0.2–1.2)
BUN: 15 mg/dL (ref 6–23)
CO2: 28 meq/L (ref 19–32)
Calcium: 9.5 mg/dL (ref 8.4–10.5)
Chloride: 103 mEq/L (ref 96–112)
Creatinine, Ser: 0.85 mg/dL (ref 0.40–1.50)
GFR: 106.93 mL/min (ref >60.00)
GLUCOSE: 116 mg/dL — AB (ref 70–99)
Potassium: 4.2 mEq/L (ref 3.5–5.1)
SODIUM: 141 meq/L (ref 135–145)
TOTAL PROTEIN: 6.8 g/dL (ref 6.0–8.3)

## 2017-12-26 LAB — CBC WITH DIFFERENTIAL/PLATELET
BASOS ABS: 0.1 10*3/uL (ref 0.0–0.1)
Basophils Relative: 0.7 % (ref 0.0–3.0)
EOS ABS: 0.3 10*3/uL (ref 0.0–0.7)
Eosinophils Relative: 2.9 % (ref 0.0–5.0)
HCT: 47.4 % (ref 39.0–52.0)
Hemoglobin: 15.9 g/dL (ref 13.0–17.0)
LYMPHS ABS: 2.2 10*3/uL (ref 0.7–4.0)
Lymphocytes Relative: 24.3 % (ref 12.0–46.0)
MCHC: 33.6 g/dL (ref 30.0–36.0)
MCV: 83.6 fl (ref 78.0–100.0)
MONO ABS: 0.6 10*3/uL (ref 0.1–1.0)
MONOS PCT: 6.4 % (ref 3.0–12.0)
NEUTROS PCT: 65.7 % (ref 43.0–77.0)
Neutro Abs: 5.9 10*3/uL (ref 1.4–7.7)
Platelets: 247 10*3/uL (ref 150.0–400.0)
RBC: 5.67 Mil/uL (ref 4.22–5.81)
RDW: 14.4 % (ref 11.5–15.5)
WBC: 9 10*3/uL (ref 4.0–10.5)

## 2017-12-26 LAB — HEMOGLOBIN A1C: HEMOGLOBIN A1C: 5.9 % (ref 4.6–6.5)

## 2017-12-26 MED ORDER — VALSARTAN 40 MG PO TABS: 40.0000 mg | ORAL_TABLET | Freq: Every day | ORAL | 1 refills | Status: DC

## 2017-12-26 MED ORDER — POLYMYXIN B-TRIMETHOPRIM 10000-0.1 UNIT/ML-% OP SOLN: 2.0000 [drp] | OPHTHALMIC | 0 refills | Status: DC

## 2017-12-26 NOTE — Progress Notes (Signed)
I acted as a Education administrator for [employer], PA-C Anselmo Pickler, LPN  Subjective:    Aaron Frost is a 37 y.o. male and is here for a comprehensive physical exam.  HPI  There are no preventive care reminders to display for this patient.  Acute Concerns: Eye redness -- states that he has had some issues with eye irritation when exposed to paint  Chronic Issues: Obesity -- thinking of doing bariatric surgery HTN -- Currently taking Valsartan 40 mg. He has not taken Lasix since the late summer. At home blood pressure readings are: <140/80. Patient denies chest pain, SOB, blurred vision, dizziness, unusual headaches, lower leg swelling. Patient is compliant with medication. Denies excessive caffeine intake, stimulant usage, excessive alcohol intake, or increase in salt consumption. OSA -- compliant with CPAP, continues to see pulmonology GERD -- heartburn well controlled with prn prilosec  Health Maintenance: Immunizations -- UTD Colonoscopy -- N/A PSA -- N/A Diet -- continue to work on diet Caffeine intake -- minimal Sleep habits -- good with CPAP Exercise -- limited due to weight Weight -- Weight: (!) 425 lb 4 oz (192.9 kg)  Weight history Wt Readings from Last 10 Encounters:  12/26/17 (!) 425 lb 4 oz (192.9 kg)  12/12/17 (!) 410 lb (186 kg)  10/18/17 (!) 428 lb 6.4 oz (194.3 kg)  08/30/17 (!) 405 lb 6.4 oz (183.9 kg)  08/26/17 (!) 405 lb 6.1 oz (183.9 kg)  08/16/17 (!) 414 lb (187.8 kg)  08/16/17 (!) 414 lb (187.8 kg)  08/09/17 (!) 414 lb 7.4 oz (188 kg)  08/09/17 (!) 414 lb (187.8 kg)  07/31/17 (!) 414 lb 6.4 oz (188 kg)   Mood -- overall good spirits Tobacco use -- none Alcohol use --- very rare glass of wine  Depression screen PHQ 2/9 12/26/2017  Decreased Interest 0  Down, Depressed, Hopeless 0  PHQ - 2 Score 0   Other providers/specialists: Patient Care Team: Inda Coke, [place] as PCP - General (Physician Assistant)   PMHx, SurgHx, SocialHx,  Medications, and Allergies were reviewed in the Visit Navigator and updated as appropriate.   Past Medical History:  Diagnosis Date  . Asthma 08/09/2017  . OSA on CPAP   . Umbilical hernia 9983     Past Surgical History:  Procedure Laterality Date  . EXTERNAL EAR SURGERY     right ear reconstruction  . MOLE REMOVAL     Baco of Neck  . TONSILLECTOMY  2005     Family History  Problem Relation Age of Onset  . Colon cancer Paternal Grandfather 44  . Heart disease Paternal Grandfather   . Stroke Paternal Grandfather   . Heart disease Father   . Hypertension Mother        grandfather/uncles  . Diabetes Maternal Grandfather   . Cancer Sister        male  . Cancer Paternal Grandmother 78       stomach  . Breast cancer Neg Hx   . Prostate cancer Neg Hx     Social History   Tobacco Use  . Smoking status: Former Smoker    Packs/day: 1.50    Years: 3.00    Pack years: 4.50    Types: Cigarettes    Last attempt to quit: 01/09/2004    Years since quitting: 13.9  . Smokeless tobacco: Never Used  Substance Use Topics  . Alcohol use: Yes    Comment: very rare glass of wine  . Drug use: No    Review  of Systems:   Review of Systems  Constitutional: Negative.  Negative for chills, fever, malaise/fatigue and weight loss.  HENT: Negative.  Negative for hearing loss, sinus pain and sore throat.   Eyes: Negative.  Negative for blurred vision.  Respiratory: Negative.  Negative for cough and shortness of breath.   Cardiovascular: Negative.  Negative for chest pain, palpitations and leg swelling.  Gastrointestinal: Positive for constipation. Negative for abdominal pain, diarrhea, heartburn, nausea and vomiting.  Genitourinary: Negative.  Negative for dysuria, frequency and urgency.  Musculoskeletal: Negative.  Negative for back pain, myalgias and neck pain.  Skin: Negative.  Negative for itching and rash.  Neurological: Negative.  Negative for dizziness, tingling, seizures, loss  of consciousness and headaches.  Endo/Heme/Allergies: Negative.  Negative for polydipsia.  Psychiatric/Behavioral: Negative.  Negative for depression. The patient is not nervous/anxious.     Objective:   Vitals:   12/26/17 0823  BP: 130/86  Pulse: 76  Temp: 98.7 F (37.1 C)  SpO2: 97%   Body mass index is 56.1 kg/m.  General Appearance:  Alert, cooperative, no distress, appears stated age  Head:  Normocephalic, without obvious abnormality, atraumatic  Eyes:  PERRL, R conjunctiva with erythema >> L, no tearing or crusting, EOM's intact, fundi benign, both eyes       Ears:  Normal TM's and external ear canals, both ears  Nose: Nares normal, septum midline, mucosa normal, no drainage    or sinus tenderness  Throat: Lips, mucosa, and tongue normal; teeth and gums normal  Neck: Supple, symmetrical, trachea midline, no adenopathy; thyroid:  No enlargement/tenderness/nodules; no carotit bruit or JVD  Back:   Symmetric, no curvature, ROM normal, no CVA tenderness  Lungs:   Clear to auscultation bilaterally, respirations unlabored  Chest wall:  No tenderness or deformity  Heart:  Regular rate and rhythm, S1 and S2 normal, no murmur, rub   or gallop  Abdomen:   Soft, non-tender, bowel sounds active all four quadrants, no masses, no organomegaly  Extremities: Extremities normal, atraumatic, no cyanosis or edema  Prostate: Not done.   Skin: Skin color, texture, turgor normal, no rashes or lesions  Lymph nodes: Cervical, supraclavicular, and axillary nodes normal  Neurologic: CNII-XII grossly intact. Normal strength, sensation and reflexes throughout     Assessment/Plan:   Aaron Frost was seen today for annual exam.  Diagnoses and all orders for this visit:  Encounter for general adult medical examination with abnormal findings Today patient counseled on age appropriate routine health concerns for screening and prevention, each reviewed and up to date or declined. Immunizations reviewed  and up to date or declined. Labs ordered and reviewed. Risk factors for depression reviewed and negative. Hearing function and visual acuity are intact. ADLs screened and addressed as needed. Functional ability and level of safety reviewed and appropriate. Education, counseling and referrals performed based on assessed risks today. Patient provided with a copy of personalized plan for preventive services.  BMI 50.0-59.9, adult [facility]) Considering bariatric surgery. I think he would be a fantastic candidate. He wants to wait for new year and new insurance. We will revisit in 3 months. Continue diet and exercise. Discussed metformin, will add if HgbA1c continues to be in pre-diabetic range. -     CBC with Differential/Platelet -     Comprehensive metabolic panel -     Hemoglobin A1c  Insulin resistance See above -     Hemoglobin A1c  Screening for HIV (human immunodeficiency virus)  Encounter for lipid screening for cardiovascular  disease -     Lipid panel  Acute conjunctivitis of both eyes, unspecified acute conjunctivitis type Continue supportive care. Did prescribe polytrim should symptoms worsen or not improve with threatment.  Other orders -     trimethoprim-polymyxin b (POLYTRIM) ophthalmic solution; Place 2 drops into the right eye every 4 (four) hours.    Well Adult Exam: Labs ordered: Yes. Patient counseling was done. See below for items discussed. Discussed the patient's BMI.  The BMI is not in the acceptable range; BMI management plan is completed Follow up in 3 months.  Patient Counseling: [x]   Nutrition: Stressed importance of moderation in sodium/caffeine intake, saturated fat and cholesterol, caloric balance, sufficient intake of fresh fruits, vegetables, and fiber.  [x]   Stressed the importance of regular exercise.   []   Substance Abuse: Discussed cessation/primary prevention of tobacco, alcohol, or other drug use; driving or other dangerous activities under the influence;  availability of treatment for abuse.   [x]   Injury prevention: Discussed safety belts, safety helmets, smoke detector, smoking near bedding or upholstery.   []   Sexuality: Discussed sexually transmitted diseases, partner selection, use of condoms, avoidance of unintended pregnancy  and contraceptive alternatives.   [x]   Dental health: Discussed importance of regular tooth brushing, flossing, and dental visits.  [x]   Health maintenance and immunizations reviewed. Please refer to Health maintenance section.    CMA or LPN served as scribe during this visit. History, Physical, and Plan performed by medical provider. The above documentation has been reviewed and is accurate and complete.  Inda Coke, PA-C [place]

## 2017-12-26 NOTE — Patient Instructions (Signed)
It was great to see you!  Please go to the lab for blood work.   Our office will call you with your results unless you have chosen to receive results via MyChart.  Let's follow-up in 3 months. I would like for you to think about starting metformin.  I have sent in eye drops for you  If anything is abnormal we will treat accordingly and get you in for a follow-up.  Take care,  [facility] Maintenance, Male A healthy lifestyle and preventive care is important for your health and wellness. Ask your health care provider about what schedule of regular examinations is right for you. What should I know about weight and diet? Eat a Healthy Diet  Eat plenty of vegetables, fruits, whole grains, low-fat dairy products, and lean protein.  Do not eat a lot of foods high in solid fats, added sugars, or salt.  Maintain a Healthy Weight Regular exercise can help you achieve or maintain a healthy weight. You should:  Do at least 150 minutes of exercise each week. The exercise should increase your heart rate and make you sweat (moderate-intensity exercise).  Do strength-training exercises at least twice a week. Watch Your Levels of Cholesterol and Blood Lipids  Have your blood tested for lipids and cholesterol every 5 years starting at 38 years of age. If you are at high risk for heart disease, you should start having your blood tested when you are 38 years old. You may need to have your cholesterol levels checked more often if: ? Your lipid or cholesterol levels are high. ? You are older than 38 years of age. ? You are at high risk for heart disease. What should I know about cancer screening? Many types of cancers can be detected early and may often be prevented. Lung Cancer  You should be screened every year for lung cancer if: ? You are a current smoker who has smoked for at least 30 years. ? You are a former smoker who has quit within the past 15 years.  Talk to your health care  provider about your screening options, when you should start screening, and how often you should be screened. Colorectal Cancer  Routine colorectal cancer screening usually begins at 38 years of age and should be repeated every 5-10 years until you are 38 years old. You may need to be screened more often if early forms of precancerous polyps or small growths are found. Your health care provider may recommend screening at an earlier age if you have risk factors for colon cancer.  Your health care provider may recommend using home test kits to check for hidden blood in the stool.  A small camera at the end of a tube can be used to examine your colon (sigmoidoscopy or colonoscopy). This checks for the earliest forms of colorectal cancer. Prostate and Testicular Cancer  Depending on your age and overall health, your health care provider may do certain tests to screen for prostate and testicular cancer.  Talk to your health care provider about any symptoms or concerns you have about testicular or prostate cancer. Skin Cancer  Check your skin from head to toe regularly.  Tell your health care provider about any new moles or changes in moles, especially if: ? There is a change in a mole's size, shape, or color. ? You have a mole that is larger than a pencil eraser.  Always use sunscreen. Apply sunscreen liberally and repeat throughout the day.  Protect  yourself by wearing long sleeves, pants, a wide-brimmed hat, and sunglasses when outside. What should I know about heart disease, diabetes, and high blood pressure?  If you are 23-6 years of age, have your blood pressure checked every 3-5 years. If you are 41 years of age or older, have your blood pressure checked every year. You should have your blood pressure measured twice-once when you are at a hospital or clinic, and once when you are not at a hospital or clinic. Record the average of the two measurements. To check your blood pressure when you  are not at a hospital or clinic, you can use: ? An automated blood pressure machine at a pharmacy. ? A home blood pressure monitor.  Talk to your health care provider about your target blood pressure.  If you are between 35-70 years old, ask your health care provider if you should take aspirin to prevent heart disease.  Have regular diabetes screenings by checking your fasting blood sugar level. ? If you are at a normal weight and have a low risk for diabetes, have this test once every three years after the age of 58. ? If you are overweight and have a high risk for diabetes, consider being tested at a younger age or more often.  A one-time screening for abdominal aortic aneurysm (AAA) by ultrasound is recommended for men aged 37-75 years who are current or former smokers. What should I know about preventing infection? Hepatitis B If you have a higher risk for hepatitis B, you should be screened for this virus. Talk with your health care provider to find out if you are at risk for hepatitis B infection. Hepatitis C Blood testing is recommended for:  Everyone born from [birth year] through 1966.  Anyone with known risk factors for hepatitis C. Sexually Transmitted Diseases (STDs)  You should be screened each year for STDs including gonorrhea and chlamydia if: ? You are sexually active and are younger than 38 years of age. ? You are older than 38 years of age and your health care provider tells you that you are at risk for this type of infection. ? Your sexual activity has changed since you were last screened and you are at an increased risk for chlamydia or gonorrhea. Ask your health care provider if you are at risk.  Talk with your health care provider about whether you are at high risk of being infected with HIV. Your health care provider may recommend a prescription medicine to help prevent HIV infection. What else can I do?  Schedule regular health, dental, and eye exams.  Stay current  with your vaccines (immunizations).  Do not use any tobacco products, such as cigarettes, chewing tobacco, and e-cigarettes. If you need help quitting, ask your health care provider.  Limit alcohol intake to no more than 2 drinks per day. One drink equals 12 ounces of beer, 5 ounces of wine, or 1 ounces of hard liquor.  Do not use street drugs.  Do not share needles.  Ask your health care provider for help if you need support or information about quitting drugs.  Tell your health care provider if you often feel depressed.  Tell your health care provider if you have ever been abused or do not feel safe at home. This information is not intended to replace advice given to you by your health care provider. Make sure you discuss any questions you have with your health care provider. Document Released: 06/23/2007 Document Revised: 08/24/2015 Document Reviewed:  09/28/2014 Elsevier Interactive Patient Education  2019 [employer]  

## 2017-12-27 ENCOUNTER — Other Ambulatory Visit: Payer: Self-pay | Admitting: Physician Assistant

## 2017-12-27 MED ORDER — METFORMIN HCL ER 500 MG PO TB24: ORAL_TABLET | ORAL | 3 refills | Status: DC

## 2018-02-13 ENCOUNTER — Encounter: Payer: Self-pay | Admitting: Physician Assistant

## 2018-02-13 ENCOUNTER — Ambulatory Visit: Payer: BLUE CROSS/BLUE SHIELD | Admitting: Physician Assistant

## 2018-02-13 VITALS — BP 120/82 | HR 75 | Temp 98.6°F | Ht 73.0 in | Wt >= 6400 oz

## 2018-02-13 DIAGNOSIS — M545 Low back pain, unspecified: Secondary | ICD-10-CM

## 2018-02-13 DIAGNOSIS — G8929 Other chronic pain: Secondary | ICD-10-CM

## 2018-02-13 NOTE — Progress Notes (Signed)
Aaron Frost is a 38 y.o. male is here to discuss: Stand up desk  I acted as a Education administrator for [employer], PA-C Anselmo Pickler, LPN   History of Present Illness:   Chief Complaint  Patient presents with  . Discuss Stand up desk for work    HPI  Pt here today to discuss getting a stand up desk for work. He states that while he is contemplating his bariatric surgery, he would like to use a standup desk to help with lower back pain and to promote healthier lifestyle. His work is offering this for him.  There are no preventive care reminders to display for this patient.  Past Medical History:  Diagnosis Date  . Asthma 08/09/2017  . OSA on CPAP   . Umbilical hernia 7672     Social History   Socioeconomic History  . Marital status: Divorced    Spouse name: Not on file  . Number of children: Not on file  . Years of education: Not on file  . Highest education level: Not on file  Occupational History  . Occupation: Research scientist (physical sciences): Sports coach  Social Needs  . Financial resource strain: Not on file  . Food insecurity:    Worry: Not on file    Inability: Not on file  . Transportation needs:    Medical: Not on file    Non-medical: Not on file  Tobacco Use  . Smoking status: Former Smoker    Packs/day: 1.50    Years: 3.00    Pack years: 4.50    Types: Cigarettes    Last attempt to quit: 01/09/2004    Years since quitting: 14.1  . Smokeless tobacco: Never Used  Substance and Sexual Activity  . Alcohol use: Yes    Comment: very rare glass of wine  . Drug use: No  . Sexual activity: Never  Lifestyle  . Physical activity:    Days per week: Not on file    Minutes per session: Not on file  . Stress: Not on file  Relationships  . Social connections:    Talks on phone: Not on file    Gets together: Not on file    Attends religious service: Not on file    Active member of club or organization: Not on file    Attends meetings of clubs or organizations:  Not on file    Relationship status: Not on file  . Intimate partner violence:    Fear of current or ex partner: Not on file    Emotionally abused: Not on file    Physically abused: Not on file    Forced sexual activity: Not on file  Other Topics Concern  . Not on file  Social History Narrative   Work for [employer] -- sedentary   Divorced, Dec 2015    Live in [place]: walk, watch TV    Past Surgical History:  Procedure Laterality Date  . EXTERNAL EAR SURGERY     right ear reconstruction  . MOLE REMOVAL     Baco of Neck  . TONSILLECTOMY  2005    Family History  Problem Relation Age of Onset  . Colon cancer Paternal Grandfather 72  . Heart disease Paternal Grandfather   . Stroke Paternal Grandfather   . Heart disease Father   . Hypertension Mother        grandfather/uncles  . Diabetes Maternal Grandfather   . Cancer Sister  male  . Cancer Paternal Grandmother 61       stomach  . Breast cancer Neg Hx   . Prostate cancer Neg Hx     PMHx, SurgHx, SocialHx, FamHx, Medications, and Allergies were reviewed in the Visit Navigator and updated as appropriate.   Patient Active Problem List   Diagnosis Date Noted  . Acute hypoxemic respiratory failure ([place]) 08/30/2017  . Normocytic anemia 08/10/2017  . Hyperglycemia 08/10/2017  . Asthma 08/09/2017  . BMI 50.0-59.9, adult ([place]) 04/05/2016  . Hemorrhoids 04/05/2016  . Essential hypertension 04/05/2016  . Gastroesophageal reflux disease 04/05/2016  . Ventral hernia 12/24/2012  . Pain of left heel 11/24/2011  . OSA on CPAP 10/08/2011  . Leg swelling 10/07/2011  . Radicular pain in left arm 10/07/2011  . Obesity, unspecified 06/10/2007  . DEPRESSION 06/10/2007  . Allergic rhinitis 06/10/2007    Social History   Tobacco Use  . Smoking status: Former Smoker    Packs/day: 1.50    Years: 3.00    Pack years: 4.50    Types: Cigarettes    Last attempt to quit: 01/09/2004    Years since quitting:  14.1  . Smokeless tobacco: Never Used  Substance Use Topics  . Alcohol use: Yes    Comment: very rare glass of wine  . Drug use: No    Current Medications and Allergies:    Current Outpatient Medications:  .  acetaminophen (TYLENOL) 500 MG tablet, Take 500 mg by mouth every 6 (six) hours as needed for mild pain. , Disp: , Rfl:  .  fluticasone (FLONASE) 50 MCG/ACT nasal spray, Place 2 sprays into both nostrils daily. (Patient taking differently: Place 2 sprays into both nostrils daily as needed for allergies or rhinitis. ), Disp: 16 g, Rfl: 6 .  furosemide (LASIX) 20 MG tablet, Take 1 tablet (20 mg total) by mouth daily as needed for edema., Disp: 30 tablet, Rfl: 3 .  ibuprofen (ADVIL,MOTRIN) 200 MG tablet, Take 400 mg by mouth every 6 (six) hours as needed for headache. , Disp: , Rfl:  .  metFORMIN (GLUCOPHAGE XR) 500 MG 24 hr tablet, Start 500mg  PO qpm., Disp: 90 tablet, Rfl: 3 .  montelukast (SINGULAIR) 10 MG tablet, Take 1 tablet (10 mg total) by mouth at bedtime., Disp: 30 tablet, Rfl: 6 .  Multiple Vitamin (MULTIVITAMIN WITH MINERALS) TABS tablet, Take 1 tablet by mouth daily., Disp: , Rfl:  .  omeprazole (PRILOSEC OTC) 20 MG tablet, Take 20 mg by mouth daily. , Disp: , Rfl:  .  potassium chloride SA (K-DUR,KLOR-CON) 20 MEQ tablet, Take 1 tablet daily when taking 1 tablet of lasix., Disp: 30 tablet, Rfl: 1 .  valsartan (DIOVAN) 40 MG tablet, Take 1 tablet (40 mg total) by mouth daily., Disp: 90 tablet, Rfl: 1   Allergies  Allergen Reactions  . Aleve [Naproxen] Other (See Comments)    Burning sensation and headaches  . Nickel Rash    Review of Systems   Review of Systems  Constitutional: Negative for chills, fever, malaise/fatigue and weight loss.  Respiratory: Negative for shortness of breath.   Cardiovascular: Negative for chest pain, orthopnea, claudication and leg swelling.  Gastrointestinal: Negative for heartburn, nausea and vomiting.  Neurological: Negative for  dizziness, tingling and headaches.   Vitals:   Vitals:   02/13/18 0744  BP: 120/82  Pulse: 75  Temp: 98.6 F (37 C)  TempSrc: Oral  SpO2: 97%  Weight: (!) 433 lb 4 oz (196.5 kg)  Height: 6'  1" (1.854 m)     Body mass index is 57.16 kg/m.  Physical Exam:    Physical Exam Vitals signs and nursing note reviewed.  Constitutional:      General: He is not in acute distress.    Appearance: He is well-developed. He is not ill-appearing or toxic-appearing.  Cardiovascular:     Rate and Rhythm: Normal rate and regular rhythm.     Pulses: Normal pulses.     Heart sounds: Normal heart sounds, S1 normal and S2 normal.     Comments: No LE edema Pulmonary:     Effort: Pulmonary effort is normal.     Breath sounds: Normal breath sounds.  Skin:    General: Skin is warm and dry.  Neurological:     Mental Status: He is alert.     GCS: GCS eye subscore is 4. GCS verbal subscore is 5. GCS motor subscore is 6.  Psychiatric:        Speech: Speech normal.        Behavior: Behavior normal. Behavior is cooperative.      Assessment and Plan:    Aaron Frost was seen today for discuss stand up desk for work.  Diagnoses and all orders for this visit:  Class 3 severe obesity without serious comorbidity in adult, unspecified BMI, unspecified obesity type ([place])  Chronic midline low back pain without sciatica   Provided note for patient regarding appropriate need for stand-up desk for work.  . Reviewed expectations re: course of current medical issues. . Discussed self-management of symptoms. . Outlined signs and symptoms indicating need for more acute intervention. . Patient verbalized understanding and all questions were answered. . See orders for this visit as documented in the electronic medical record. . Patient received an After Visit Summary.  CMA or LPN served as scribe during this visit. History, Physical, and Plan performed by medical provider. The above documentation has been  reviewed and is accurate and complete.  Inda Coke, PA-C [place], [place] 02/13/2018  Follow-up: No follow-ups on file.

## 2018-03-11 ENCOUNTER — Ambulatory Visit: Payer: BLUE CROSS/BLUE SHIELD | Admitting: Physician Assistant

## 2018-03-11 ENCOUNTER — Encounter: Payer: Self-pay | Admitting: Physician Assistant

## 2018-03-11 VITALS — BP 130/80 | HR 85 | Temp 98.6°F | Ht 73.0 in | Wt >= 6400 oz

## 2018-03-11 DIAGNOSIS — Z6841 Body Mass Index (BMI) 40.0 and over, adult: Secondary | ICD-10-CM | POA: Diagnosis not present

## 2018-03-11 DIAGNOSIS — R739 Hyperglycemia, unspecified: Secondary | ICD-10-CM | POA: Diagnosis not present

## 2018-03-11 DIAGNOSIS — E8881 Metabolic syndrome: Secondary | ICD-10-CM | POA: Diagnosis not present

## 2018-03-11 DIAGNOSIS — R079 Chest pain, unspecified: Secondary | ICD-10-CM

## 2018-03-11 LAB — GLUCOSE, POCT (MANUAL RESULT ENTRY): POC Glucose: 88 mg/dl (ref 70–99)

## 2018-03-11 LAB — COMPREHENSIVE METABOLIC PANEL
ALT: 34 U/L (ref 0–53)
AST: 27 U/L (ref 0–37)
Albumin: 4.2 g/dL (ref 3.5–5.2)
Alkaline Phosphatase: 71 U/L (ref 39–117)
BILIRUBIN TOTAL: 0.6 mg/dL (ref 0.2–1.2)
BUN: 14 mg/dL (ref 6–23)
CO2: 27 mEq/L (ref 19–32)
Calcium: 9 mg/dL (ref 8.4–10.5)
Chloride: 101 mEq/L (ref 96–112)
Creatinine, Ser: 0.85 mg/dL (ref 0.40–1.50)
GFR: 100.5 mL/min (ref >60.00)
Glucose, Bld: 88 mg/dL (ref 70–99)
Potassium: 4.2 mEq/L (ref 3.5–5.1)
Sodium: 136 mEq/L (ref 135–145)
Total Protein: 6.6 g/dL (ref 6.0–8.3)

## 2018-03-11 LAB — CBC WITH DIFFERENTIAL/PLATELET
Basophils Absolute: 0.1 10*3/uL (ref 0.0–0.1)
Basophils Relative: 0.8 % (ref 0.0–3.0)
EOS ABS: 0.2 10*3/uL (ref 0.0–0.7)
Eosinophils Relative: 2.5 % (ref 0.0–5.0)
HCT: 48.7 % (ref 39.0–52.0)
Hemoglobin: 15.9 g/dL (ref 13.0–17.0)
Lymphocytes Relative: 23.1 % (ref 12.0–46.0)
Lymphs Abs: 1.9 10*3/uL (ref 0.7–4.0)
MCHC: 32.7 g/dL (ref 30.0–36.0)
MCV: 83.6 fl (ref 78.0–100.0)
Monocytes Absolute: 0.7 10*3/uL (ref 0.1–1.0)
Monocytes Relative: 8.1 % (ref 3.0–12.0)
Neutro Abs: 5.3 10*3/uL (ref 1.4–7.7)
Neutrophils Relative %: 65.5 % (ref 43.0–77.0)
Platelets: 240 10*3/uL (ref 150.0–400.0)
RBC: 5.82 Mil/uL — ABNORMAL HIGH (ref 4.22–5.81)
RDW: 14.2 % (ref 11.5–15.5)
WBC: 8.1 10*3/uL (ref 4.0–10.5)

## 2018-03-11 LAB — TSH: TSH: 2.03 u[IU]/mL (ref 0.35–4.50)

## 2018-03-11 NOTE — Progress Notes (Addendum)
Aaron Frost is a 38 y.o. male here for a new problem.  I acted as a Education administrator for [employer], PA-C Anselmo Pickler, LPN  History of Present Illness:   Chief Complaint  Patient presents with  . Generalized Body Aches    HPI   Intermittent chest discomfort on L side x 1 week. When going from sitting to standing having lightheadedness, he states that this is happening 1-2 times per day. No unusual HA's, slurred speech, confusion.  He reports that since he has been in his 20s he has had some intermittent left shoulder/chest pain, at that time he thought it was related to his heart and had a thorough work-up and his heart was ruled out.  He is concerned because he still having some intermittent left shoulder pain, that does not radiate.  He denies nausea, weight loss, abdominal pain, changes in urine.  He does report that he sometimes he feels a little shaky or off, wants to make sure his blood sugar isn't too low today  He does endorse depression and anxiety. Denies current SI/HI.  He reports that his symptoms do not worsen with activity.  He states he has been checking his oxygen level at home and has been intermittent but always in the 90s.  He also reports that he checks his blood pressure intermittently and its been pretty normal for him.  Patient has a strong family history of heart disease.  Grandfather had a stroke, father has heart disease, and several male relatives with hypertension.  Wt Readings from Last 5 Encounters:  03/11/18 (!) 432 lb (196 kg)  02/13/18 (!) 433 lb 4 oz (196.5 kg)  12/26/17 (!) 425 lb 4 oz (192.9 kg)  12/12/17 (!) 410 lb (186 kg)  10/18/17 (!) 428 lb 6.4 oz (194.3 kg)   BP Readings from Last 3 Encounters:  03/11/18 130/80  02/13/18 120/82  12/26/17 130/86   He is also ready to pursue bariatric surgery.   Past Medical History:  Diagnosis Date  . Asthma 08/09/2017  . OSA on CPAP   . Umbilical hernia 6237     Social History   Socioeconomic  History  . Marital status: Divorced    Spouse name: Not on file  . Number of children: Not on file  . Years of education: Not on file  . Highest education level: Not on file  Occupational History  . Occupation: Research scientist (physical sciences): Sports coach  Social Needs  . Financial resource strain: Not on file  . Food insecurity:    Worry: Not on file    Inability: Not on file  . Transportation needs:    Medical: Not on file    Non-medical: Not on file  Tobacco Use  . Smoking status: Former Smoker    Packs/day: 1.50    Years: 3.00    Pack years: 4.50    Types: Cigarettes    Last attempt to quit: 01/09/2004    Years since quitting: 14.1  . Smokeless tobacco: Never Used  Substance and Sexual Activity  . Alcohol use: Yes    Comment: very rare glass of wine  . Drug use: No  . Sexual activity: Never  Lifestyle  . Physical activity:    Days per week: Not on file    Minutes per session: Not on file  . Stress: Not on file  Relationships  . Social connections:    Talks on phone: Not on file    Gets together: Not on file  Attends religious service: Not on file    Active member of club or organization: Not on file    Attends meetings of clubs or organizations: Not on file    Relationship status: Not on file  . Intimate partner violence:    Fear of current or ex partner: Not on file    Emotionally abused: Not on file    Physically abused: Not on file    Forced sexual activity: Not on file  Other Topics Concern  . Not on file  Social History Narrative   Work for [employer] -- sedentary   Divorced, Dec 2015    Live in [place]: walk, watch TV    Past Surgical History:  Procedure Laterality Date  . EXTERNAL EAR SURGERY     right ear reconstruction  . MOLE REMOVAL     Baco of Neck  . TONSILLECTOMY  2005    Family History  Problem Relation Age of Onset  . Colon cancer Paternal Grandfather 77  . Heart disease Paternal Grandfather   . Stroke Paternal  Grandfather   . Heart disease Father   . Hypertension Mother        grandfather/uncles  . Diabetes Maternal Grandfather   . Cancer Sister        male  . Cancer Paternal Grandmother 42       stomach  . Breast cancer Neg Hx   . Prostate cancer Neg Hx     Allergies  Allergen Reactions  . Aleve [Naproxen] Other (See Comments)    Burning sensation and headaches  . Nickel Rash    Current Medications:   Current Outpatient Medications:  .  acetaminophen (TYLENOL) 500 MG tablet, Take 500 mg by mouth every 6 (six) hours as needed for mild pain. , Disp: , Rfl:  .  ibuprofen (ADVIL,MOTRIN) 200 MG tablet, Take 400 mg by mouth every 6 (six) hours as needed for headache. , Disp: , Rfl:  .  metFORMIN (GLUCOPHAGE XR) 500 MG 24 hr tablet, Start 500mg  PO qpm., Disp: 90 tablet, Rfl: 3 .  montelukast (SINGULAIR) 10 MG tablet, Take 1 tablet (10 mg total) by mouth at bedtime., Disp: 30 tablet, Rfl: 6 .  Multiple Vitamin (MULTIVITAMIN WITH MINERALS) TABS tablet, Take 1 tablet by mouth daily., Disp: , Rfl:  .  omeprazole (PRILOSEC OTC) 20 MG tablet, Take 20 mg by mouth daily. , Disp: , Rfl:  .  valsartan (DIOVAN) 40 MG tablet, Take 1 tablet (40 mg total) by mouth daily., Disp: 90 tablet, Rfl: 1 .  valsartan (DIOVAN) 40 MG tablet, , Disp: , Rfl:  .  fluticasone (FLONASE) 50 MCG/ACT nasal spray, Place 2 sprays into both nostrils daily. (Patient not taking: Reported on 03/11/2018), Disp: 16 g, Rfl: 6 .  furosemide (LASIX) 20 MG tablet, Take 1 tablet (20 mg total) by mouth daily as needed for edema. (Patient not taking: Reported on 03/11/2018), Disp: 30 tablet, Rfl: 3 .  potassium chloride SA (K-DUR,KLOR-CON) 20 MEQ tablet, Take 1 tablet daily when taking 1 tablet of lasix. (Patient not taking: Reported on 03/11/2018), Disp: 30 tablet, Rfl: 1   Review of Systems:   Review of Systems  Constitutional: Negative for chills, fever, malaise/fatigue and weight loss.  Respiratory: Negative for shortness of breath.    Cardiovascular: Positive for chest pain. Negative for orthopnea, claudication and leg swelling.  Gastrointestinal: Negative for heartburn, nausea and vomiting.  Neurological: Negative for dizziness, tingling, speech change, focal weakness, weakness and  headaches.  Psychiatric/Behavioral: Negative for depression and suicidal ideas. The patient is not nervous/anxious.      Vitals:   Vitals:   03/11/18 1142 03/11/18 1223  BP: (!) 150/90 130/80  Pulse: 85   Temp: 98.6 F (37 C)   TempSrc: Oral   SpO2: 96%   Weight: (!) 432 lb (196 kg)   Height: 6\' 1"  (1.854 m)      Body mass index is 57 kg/m.  Physical Exam:   Physical Exam Vitals signs and nursing note reviewed.  Constitutional:      General: He is not in acute distress.    Appearance: He is well-developed. He is not ill-appearing or toxic-appearing.  Cardiovascular:     Rate and Rhythm: Normal rate and regular rhythm.     Pulses: Normal pulses.     Heart sounds: Normal heart sounds, S1 normal and S2 normal.     Comments: No LE edema Pulmonary:     Effort: Pulmonary effort is normal.     Breath sounds: Normal breath sounds.  Chest:     Chest wall: No mass or tenderness.     Comments: No palpable tenderness to chest wall Skin:    General: Skin is warm and dry.  Neurological:     Mental Status: He is alert.     GCS: GCS eye subscore is 4. GCS verbal subscore is 5. GCS motor subscore is 6.  Psychiatric:        Speech: Speech normal.        Behavior: Behavior normal. Behavior is cooperative.     Results for orders placed or performed in visit on 03/11/18  POCT glucose (manual entry)  Result Value Ref Range   POC Glucose 88 70 - 99 mg/dl    Assessment and Plan:   Muzamil was seen today for generalized body aches.  Diagnoses and all orders for this visit:  Chest pain, unspecified type EKG tracing is personally reviewed.  EKG notes NSR.  No acute changes. No exertional symptoms.  Will check labs. I discussed  option of sending to cardiology for possible stress test and he is agreeable to this. Blood pressure on recheck was normal. I do think he has some anxiety component to this. ER precautions given. -     EKG 12-Lead -     Comprehensive metabolic panel -     TSH -     CBC with Differential/Platelet -     Ambulatory referral to Cardiology  Hyperglycemia -     POCT glucose (manual entry)  BMI 50.0-59.9, adult ([place]); Insulin resistance Referral placed today. -     Amb Referral to Bariatric Surgery   . Reviewed expectations re: course of current medical issues. . Discussed self-management of symptoms. . Outlined signs and symptoms indicating need for more acute intervention. . Patient verbalized understanding and all questions were answered. . See orders for this visit as documented in the electronic medical record. . Patient received an After-Visit Summary.  CMA or LPN served as scribe during this visit. History, Physical, and Plan performed by medical provider. The above documentation has been reviewed and is accurate and complete.  Inda Coke, PA-C

## 2018-03-11 NOTE — Patient Instructions (Signed)
It was great to see you!  I'm going to get the process rolling on your weight loss surgery. Please do the free seminar online (see pamphlet for more info.)  You will be contacted about your referral to cardiology so we can possibly pursue stress test.  I will be in touch with your lab results.  If you have any worsening symptoms or changes, please go to the [place]!  Take care,  Inda Coke PA-C

## 2018-03-13 ENCOUNTER — Encounter: Payer: Self-pay | Admitting: Pulmonary Disease

## 2018-03-13 ENCOUNTER — Ambulatory Visit: Payer: BLUE CROSS/BLUE SHIELD | Admitting: Pulmonary Disease

## 2018-03-13 VITALS — BP 140/84 | HR 89 | Ht 73.0 in | Wt >= 6400 oz

## 2018-03-13 DIAGNOSIS — Z9989 Dependence on other enabling machines and devices: Secondary | ICD-10-CM

## 2018-03-13 DIAGNOSIS — G4733 Obstructive sleep apnea (adult) (pediatric): Secondary | ICD-10-CM | POA: Diagnosis not present

## 2018-03-13 NOTE — Progress Notes (Signed)
Subjective:    Patient ID: Aaron Frost, male    DOB: [birth year], 38 y.o.   MRN: [id number]  Chief complaint: History of obstructive sleep apnea, improved compliance with CPAP Has occasional chest discomfort which is being evaluated at present  HPI Multiple comorbidities on oxygen supplementation since recent hospitalization-has not been using oxygen, feels he does not need it any longer He has a history of obstructive sleep apnea was very compliant with CPAP, as resumed using CPAP on a regular basis, has no concerns with the pressure settings  This is his oxygen he has consistently run in the 90s and he has not been using oxygen supplementation  History of multiple allergies History of hypertension History of asthma History of depression, morbid obesity History of reflux   Review of Systems  Constitutional: Negative for fever and unexpected weight change.  Eyes: Negative for redness and itching.  Respiratory: Positive for cough. Negative for shortness of breath.   Cardiovascular: Positive for chest pain.  Genitourinary: Negative for dysuria.  Musculoskeletal: Negative for joint swelling.  Skin: Negative for rash.  Allergic/Immunologic: Negative.    Past Medical History:  Diagnosis Date  . Asthma 08/09/2017  . OSA on CPAP   . Umbilical hernia 7741   Social History   Socioeconomic History  . Marital status: Divorced    Spouse name: Not on file  . Number of children: Not on file  . Years of education: Not on file  . Highest education level: Not on file  Occupational History  . Occupation: Research scientist (physical sciences): Sports coach  Social Needs  . Financial resource strain: Not on file  . Food insecurity:    Worry: Not on file    Inability: Not on file  . Transportation needs:    Medical: Not on file    Non-medical: Not on file  Tobacco Use  . Smoking status: Former Smoker    Packs/day: 1.50    Years: 3.00    Pack years: 4.50    Types: Cigarettes    Last  attempt to quit: 01/09/2004    Years since quitting: 14.1  . Smokeless tobacco: Never Used  Substance and Sexual Activity  . Alcohol use: Yes    Comment: very rare glass of wine  . Drug use: No  . Sexual activity: Never  Lifestyle  . Physical activity:    Days per week: Not on file    Minutes per session: Not on file  . Stress: Not on file  Relationships  . Social connections:    Talks on phone: Not on file    Gets together: Not on file    Attends religious service: Not on file    Active member of club or organization: Not on file    Attends meetings of clubs or organizations: Not on file    Relationship status: Not on file  . Intimate partner violence:    Fear of current or ex partner: Not on file    Emotionally abused: Not on file    Physically abused: Not on file    Forced sexual activity: Not on file  Other Topics Concern  . Not on file  Social History Narrative   Work for [employer] -- sedentary   Divorced, Dec 2015    Live in [place]   Fun: walk, watch TV   Current Outpatient Medications on File Prior to Visit  Medication Sig Dispense Refill  . acetaminophen (TYLENOL) 500 MG tablet Take 500 mg by  mouth every 6 (six) hours as needed for mild pain.     . fluticasone (FLONASE) 50 MCG/ACT nasal spray Place 2 sprays into both nostrils daily. (Patient not taking: Reported on 03/11/2018) 16 g 6  . furosemide (LASIX) 20 MG tablet Take 1 tablet (20 mg total) by mouth daily as needed for edema. (Patient not taking: Reported on 03/11/2018) 30 tablet 3  . ibuprofen (ADVIL,MOTRIN) 200 MG tablet Take 400 mg by mouth every 6 (six) hours as needed for headache.     . metFORMIN (GLUCOPHAGE XR) 500 MG 24 hr tablet Start 500mg  PO qpm. 90 tablet 3  . montelukast (SINGULAIR) 10 MG tablet Take 1 tablet (10 mg total) by mouth at bedtime. 30 tablet 6  . Multiple Vitamin (MULTIVITAMIN WITH MINERALS) TABS tablet Take 1 tablet by mouth daily.    Marland Kitchen omeprazole (PRILOSEC OTC) 20 MG tablet Take  20 mg by mouth daily.     . potassium chloride SA (K-DUR,KLOR-CON) 20 MEQ tablet Take 1 tablet daily when taking 1 tablet of lasix. (Patient not taking: Reported on 03/11/2018) 30 tablet 1  . valsartan (DIOVAN) 40 MG tablet Take 1 tablet (40 mg total) by mouth daily. 90 tablet 1  . valsartan (DIOVAN) 40 MG tablet     . [DISCONTINUED] hydrochlorothiazide 25 MG tablet Take 1 tablet (25 mg total) by mouth daily. 30 tablet 11   No current facility-administered medications on file prior to visit.         Objective:   Physical Exam Constitutional:      General: He is not in acute distress.    Appearance: He is not diaphoretic.     Comments: Obese  HENT:     Head: Normocephalic and atraumatic.     Nose: No congestion or rhinorrhea.  Eyes:     General:        Right eye: No discharge.     Extraocular Movements: Extraocular movements intact.     Pupils: Pupils are equal, round, and reactive to light.  Neck:     Musculoskeletal: Normal range of motion. Neck rigidity present.     Thyroid: No thyromegaly.     Trachea: No tracheal deviation.  Cardiovascular:     Rate and Rhythm: Normal rate and regular rhythm.     Heart sounds: No murmur.  Pulmonary:     Effort: Pulmonary effort is normal. No respiratory distress.     Breath sounds: No stridor. Rales present. No wheezing or rhonchi.  Abdominal:     General: Bowel sounds are normal.     Palpations: Abdomen is soft.      Recent chest x-ray 08/11/2017-reviewed, no acute infiltrate  Recent EKG unremarkable  Assessment & Plan:  Marland Kitchen  Hypoxemic respiratory failure -Solved  .  Morbid obesity -Working on weight loss  .  Asthma -Has had stable symptoms -No significant change in bronchodilator requirement  .  Obstructive sleep apnea -Compliant with CPAP use  .  Environmental allergies  Plan:  Continue CPAP use  Encouraged to bring his smartcard in on next visit  Continue weight loss efforts

## 2018-03-13 NOTE — Patient Instructions (Addendum)
Obstructive sleep apnea Adequately treated  This discomfort being evaluated, negative evaluation so far  I will see you back in the office in about 6 months Call with significant concerns  Continue weight loss efforts

## 2018-04-04 ENCOUNTER — Ambulatory Visit: Payer: BLUE CROSS/BLUE SHIELD | Admitting: Cardiovascular Disease

## 2018-04-08 ENCOUNTER — Telehealth: Payer: Self-pay | Admitting: Pulmonary Disease

## 2018-04-08 MED ORDER — MONTELUKAST SODIUM 10 MG PO TABS: 10.0000 mg | ORAL_TABLET | Freq: Every day | ORAL | 0 refills | Status: DC

## 2018-04-08 NOTE — Telephone Encounter (Signed)
Called and spoke with patient, refill has been sent for 90 day. Nothing further needed.

## 2018-06-06 ENCOUNTER — Telehealth: Payer: Self-pay

## 2018-06-06 NOTE — Telephone Encounter (Signed)
LM2CB please schedule new video pt appt on 6-9 or 6-18 as directed below

## 2018-06-06 NOTE — Telephone Encounter (Signed)
-----   Message from Elouise Munroe, MD sent at 06/05/2018  6:01 PM EDT ----- Regarding: RE: new pt appt Appropriate for video visit, please add him to my schedule for virtual visit on June 9 or June 18.  Thanks, GA ----- Message ----- From: Waylan Rocher, LPN Sent: 9/92/[id number]   2:30 PM EDT To: Elouise Munroe, MD Subject: new pt appt                                    Please review for referral from proficient for new pt appt for chest pain. PCP notes/ekg are in Epic/ Thank you, Sharyn Lull

## 2018-06-12 ENCOUNTER — Telehealth: Payer: Self-pay | Admitting: Internal Medicine

## 2018-06-12 NOTE — Telephone Encounter (Signed)
Lmtcb.  Dr Margaretann Loveless asked that this patient be called (new patient) and scheduled for a virtual visit on June 9 or June 18.  See staff message.  He was a no show in March.  Please schedule.

## 2018-06-16 NOTE — Telephone Encounter (Signed)
[facility] needs new pt appt w/ Dr Margaretann Loveless

## 2018-06-18 NOTE — Telephone Encounter (Signed)
App 07-04-2018

## 2018-06-30 ENCOUNTER — Telehealth: Payer: Self-pay | Admitting: Internal Medicine

## 2018-06-30 NOTE — Telephone Encounter (Signed)
Smartphone/ consent/ my chart via emailed/ pre reg completed

## 2018-07-02 NOTE — Telephone Encounter (Signed)
I called pt to confirm appointment with Dr Margaretann Loveless on 07-04-18.

## 2018-07-04 ENCOUNTER — Telehealth (INDEPENDENT_AMBULATORY_CARE_PROVIDER_SITE_OTHER): Payer: BC Managed Care – PPO | Admitting: Internal Medicine

## 2018-07-04 ENCOUNTER — Telehealth: Payer: Self-pay | Admitting: *Deleted

## 2018-07-04 ENCOUNTER — Encounter: Payer: Self-pay | Admitting: Internal Medicine

## 2018-07-04 VITALS — Ht 74.0 in | Wt >= 6400 oz

## 2018-07-04 DIAGNOSIS — I10 Essential (primary) hypertension: Secondary | ICD-10-CM | POA: Diagnosis not present

## 2018-07-04 DIAGNOSIS — R0609 Other forms of dyspnea: Secondary | ICD-10-CM | POA: Diagnosis not present

## 2018-07-04 DIAGNOSIS — R0600 Dyspnea, unspecified: Secondary | ICD-10-CM

## 2018-07-04 DIAGNOSIS — R079 Chest pain, unspecified: Secondary | ICD-10-CM | POA: Diagnosis not present

## 2018-07-04 DIAGNOSIS — G4730 Sleep apnea, unspecified: Secondary | ICD-10-CM

## 2018-07-04 NOTE — Progress Notes (Signed)
Virtual Visit via Video Note   This visit type was conducted due to national recommendations for restrictions regarding the COVID-19 Pandemic (e.g. social distancing) in an effort to limit this patient's exposure and mitigate transmission in our community.  Due to his co-morbid illnesses, this patient is at least at moderate risk for complications without adequate follow up.  This format is felt to be most appropriate for this patient at this time.  All issues noted in this document were discussed and addressed.  A limited physical exam was performed with this format.  Please refer to the patient's chart for his consent to telehealth for [facility].   Date:  07/04/2018   ID:  Ursula Alert, DOB 05/15/1980, MRN [id number]  Patient Location: Home Provider Location: Office  PCP:  Inda Coke, [place]  Cardiologist:  Elouise Munroe, MD  Electrophysiologist:  None   Evaluation Performed:  New Patient Evaluation  Chief Complaint:  Chest pain  History of Present Illness:    Aaron Frost is a 38 y.o. male with OSA on CPAP, HTN, asthma, depression, morbid obesity, and reflux who presents today for evaluation of chest pain. He had 2 weeks of chest pain in March, and fortunately has had none since that time. He describes the chest pain as left sided, intermittent, no identifiable provoking or relieving factors. Left shoulder pain as well. Does not worsen with deep breathing. Lasts seconds to minutes, comes and goes.  He is a former smoker.   He has recently been moving his home and lifting heavy boxes. In addition he has been swimming quite a bit for exercise. He denies chest pain with these activities but does experience shortness of breath with exertion. He has a history of asthma and OSA and is currently working on his asthma medication regimen with his pulmonologist. He is usually compliant with CPAP but has missed the last few days of use due to moving his residence. He had a  significant illness in August 2019 with hospitalization requiring temporary oxygen therapy.   He was previously beginning an evaluation for bariatric surgery due to morbid obesity, however has been on hold since COVID 19 restrictions are in place.   He has a family history of two uncles with death from MI on his mother's side. One uncle was 12 years old. His father had an MI at age 74, and his paternal grandfather also had an MI.   He feels he has had one episode of syncope in his lifetime. He was climbing a ladder and got his finger caught in a rung, which caused him to "black out" and wake up in a cold sweat.   The patient denies dyspnea at rest, palpitations, PND, orthopnea, or leg swelling. Denies presyncope. Denies dizziness or lightheadedness. Endorses snoring, on CPAP.  The patient does not have symptoms concerning for COVID-19 infection (fever, chills, cough, or new shortness of breath).    Past Medical History:  Diagnosis Date  . Asthma 08/09/2017  . OSA on CPAP   . Umbilical hernia 6834   Past Surgical History:  Procedure Laterality Date  . EXTERNAL EAR SURGERY     right ear reconstruction  . MOLE REMOVAL     Baco of Neck  . TONSILLECTOMY  2005     Current Meds  Medication Sig  . acetaminophen (TYLENOL) 500 MG tablet Take 500 mg by mouth every 6 (six) hours as needed for mild pain.   . fluticasone (FLONASE) 50 MCG/ACT nasal spray Place 2  sprays into both nostrils daily.  . metFORMIN (GLUCOPHAGE XR) 500 MG 24 hr tablet Start 500mg  PO qpm.  . montelukast (SINGULAIR) 10 MG tablet Take 1 tablet (10 mg total) by mouth at bedtime.  . Multiple Vitamin (MULTIVITAMIN WITH MINERALS) TABS tablet Take 1 tablet by mouth daily.  Marland Kitchen omeprazole (PRILOSEC OTC) 20 MG tablet Take 20 mg by mouth daily.   . valsartan (DIOVAN) 40 MG tablet Take 1 tablet (40 mg total) by mouth daily.  . Zinc 30 MG CAPS Take 30 mg by mouth.  . [DISCONTINUED] furosemide (LASIX) 20 MG tablet Take 1 tablet (20 mg  total) by mouth daily as needed for edema.  . [DISCONTINUED] ibuprofen (ADVIL,MOTRIN) 200 MG tablet Take 400 mg by mouth every 6 (six) hours as needed for headache.   . [DISCONTINUED] potassium chloride SA (K-DUR,KLOR-CON) 20 MEQ tablet Take 1 tablet daily when taking 1 tablet of lasix.  . [DISCONTINUED] valsartan (DIOVAN) 40 MG tablet      Allergies:   Aleve [naproxen] and Nickel   Social History   Tobacco Use  . Smoking status: Former Smoker    Packs/day: 1.50    Years: 3.00    Pack years: 4.50    Types: Cigarettes    Quit date: 01/09/2004    Years since quitting: 14.4  . Smokeless tobacco: Never Used  Substance Use Topics  . Alcohol use: Yes    Comment: very rare glass of wine  . Drug use: No     Family Hx: The patient's family history includes Cancer in his sister; Cancer (age of onset: 90) in his paternal grandmother; Colon cancer (age of onset: 58) in his paternal grandfather; Diabetes in his maternal grandfather; Heart disease in his father and paternal grandfather; Hypertension in his mother; Stroke in his paternal grandfather. There is no history of Breast cancer or Prostate cancer.  ROS:   Please see the history of present illness.     All other systems reviewed and are negative.   Prior CV studies:   The following studies were reviewed today:    Labs/Other Tests and Data Reviewed:    EKG:  An ECG dated 03/11/2018 was personally reviewed today and demonstrated:  NSR  Recent Labs: 08/09/2017: B Natriuretic Peptide 9.1 08/16/2017: Magnesium 2.5 03/11/2018: ALT 34; BUN 14; Creatinine, Ser 0.85; Hemoglobin 15.9; Platelets 240.0; Potassium 4.2; Sodium 136; TSH 2.03   Recent Lipid Panel Lab Results  Component Value Date/Time   CHOL 173 12/26/2017 09:06 AM   TRIG 86.0 12/26/2017 09:06 AM   HDL 42.50 12/26/2017 09:06 AM   CHOLHDL 4 12/26/2017 09:06 AM   LDLCALC 114 (H) 12/26/2017 09:06 AM    Wt Readings from Last 3 Encounters:  07/04/18 (!) 407 lb (184.6 kg)   03/13/18 (!) 431 lb (195.5 kg)  03/11/18 (!) 432 lb (196 kg)     Objective:    Vital Signs:  Ht 6\' 2"  (1.88 m)   Wt (!) 407 lb (184.6 kg)   BMI 52.26 kg/m    VITAL SIGNS:  reviewed GEN:  no acute distress EYES:  sclerae anicteric, EOMI - Extraocular Movements Intact RESPIRATORY:  normal respiratory effort, symmetric expansion CARDIOVASCULAR:  no peripheral edema SKIN:  no rash, lesions or ulcers. MUSCULOSKELETAL:  no obvious deformities. NEURO:  alert and oriented x 3, no obvious focal deficit PSYCH:  normal affect  ASSESSMENT & PLAN:    1. Chest pain, moderate coronary artery risk   2. Sleep apnea, unspecified type   3.  DOE (dyspnea on exertion)   4. Essential hypertension    Chest pain/DOE - evaluation delayed due to COVID 19. It is important given his risk factors, symptoms and family history that we evaluate his chest pain and DOE further. The best test will be a treadmill nuclear stress test. Given his weight, the test that will lead to the least amount of artifact would be a nuclear imaging study on the D-SPECT camera with supine and upright images. Could also consider CT coronary angiogram. A treadmill test will also help [place] characterize his DOE. We will perform a treadmill nuc when the treadmill lab reopens. Would use caution with regadenason due to history of asthma.   Sleep apnea/DOE - given his significant respiratory illness last year and continued DOE, an echo will help understand RV function, diastolic function. Continue CPAP.   HTN - continue valsartan, stable.   COVID-19 Education: The signs and symptoms of COVID-19 were discussed with the patient and how to seek care for testing (follow up with PCP or arrange E-visit).  The importance of social distancing was discussed today.  Time:   Today, I have spent 25 minutes with the patient with telehealth technology discussing the above problems.     Medication Adjustments/Labs and Tests Ordered: Current medicines  are reviewed at length with the patient today.  Concerns regarding medicines are outlined above.   Tests Ordered: Orders Placed This Encounter  Procedures  . MYOCARDIAL PERFUSION IMAGING  . ECHOCARDIOGRAM COMPLETE    Medication Changes: No orders of the defined types were placed in this encounter.   Follow Up:  Virtual Visit or In Person in 3 month(s)  Signed, Elouise Munroe, MD  07/04/2018 9:40 AM    [place]

## 2018-07-04 NOTE — Patient Instructions (Addendum)
Echo for dyspnea on exertion, sleep apnea and chest pain, can order now.      Medication Instructions:    NO CHANGES    Lab work:  NOT NEEDED    Testing/Procedures: Will be schedule at [facility] has requested that you have an echocardiogram. Echocardiography is a painless test that uses sound waves to create images of your heart. It provides your doctor with information about the size and shape of your heart and how well your heart's chambers and valves are working. This procedure takes approximately one hour. There are no restrictions for this procedure.  AND Your physician has requested that you have en exercise stress myoview. For further information please visit [email or web address]. Please follow instruction sheet, as given.   Follow-Up: At [facility], you and your health needs are our priority.  As part of our continuing mission to provide you with exceptional heart care, we have created designated Provider Care Teams.  These Care Teams include your primary Cardiologist (physician) and Advanced Practice Providers (APPs -  Physician Assistants and Nurse Practitioners) who all work together to provide you with the care you need, when you need it. . You will need a follow up appointment in 3 months.  Please call our office 2 months in advance to schedule this appointment.  You may see  one of the following Advanced Practice Providers on your designated Care Team:   . Rosaria Ferries, PA-C . Jory Sims, DNP, ANP  Any Other Special Instructions Will Be Listed Below (If Applicable).

## 2018-07-04 NOTE — Telephone Encounter (Signed)
SPOKE TO PATIENT , INSTRUCTION GIVEN FROM TELE-VISIT  6/26.  AVS SUMMARY WILL  BE MAILED TO PATIENT. PATIENT VERBALIZED UNDERSTANDING.

## 2018-07-17 ENCOUNTER — Telehealth (HOSPITAL_COMMUNITY): Payer: Self-pay | Admitting: *Deleted

## 2018-07-17 NOTE — Telephone Encounter (Signed)
Patient given detailed instructions per Myocardial Perfusion Study Information Sheet for the test on 07/21/18 at 10:30. Patient notified to arrive 15 minutes early and that it is imperative to arrive on time for appointment to keep from having the test rescheduled.  If you need to cancel or reschedule your appointment, please call the office within 24 hours of your appointment. . Patient verbalized understanding.Aaron Frost

## 2018-07-21 ENCOUNTER — Ambulatory Visit (HOSPITAL_BASED_OUTPATIENT_CLINIC_OR_DEPARTMENT_OTHER): Payer: BC Managed Care – PPO

## 2018-07-21 ENCOUNTER — Other Ambulatory Visit: Payer: Self-pay

## 2018-07-21 ENCOUNTER — Ambulatory Visit (HOSPITAL_COMMUNITY): Payer: BC Managed Care – PPO | Attending: Internal Medicine

## 2018-07-21 DIAGNOSIS — R0600 Dyspnea, unspecified: Secondary | ICD-10-CM

## 2018-07-21 DIAGNOSIS — R0609 Other forms of dyspnea: Secondary | ICD-10-CM

## 2018-07-21 DIAGNOSIS — R079 Chest pain, unspecified: Secondary | ICD-10-CM

## 2018-07-21 DIAGNOSIS — G4730 Sleep apnea, unspecified: Secondary | ICD-10-CM | POA: Diagnosis not present

## 2018-07-21 MED ORDER — TECHNETIUM TC 99M TETROFOSMIN IV KIT
32.9000 | PACK | Freq: Once | INTRAVENOUS | Status: AC | PRN
  Administered 2018-07-21: 32.9 via INTRAVENOUS
  Filled 2018-07-21: qty 33

## 2018-07-21 MED ORDER — PERFLUTREN LIPID MICROSPHERE
1.0000 mL | INTRAVENOUS | Status: AC | PRN
  Administered 2018-07-21: 2 mL via INTRAVENOUS

## 2018-07-21 MED ORDER — REGADENOSON 0.4 MG/5ML IV SOLN
0.4000 mg | Freq: Once | INTRAVENOUS | Status: AC
  Administered 2018-07-21: 0.4 mg via INTRAVENOUS

## 2018-07-23 ENCOUNTER — Ambulatory Visit (HOSPITAL_COMMUNITY): Payer: BC Managed Care – PPO | Attending: Cardiology

## 2018-07-23 ENCOUNTER — Other Ambulatory Visit: Payer: Self-pay

## 2018-07-23 LAB — MYOCARDIAL PERFUSION IMAGING
LV dias vol: 155 mL (ref 62–150)
LV sys vol: 100 mL
Peak HR: 82 {beats}/min
Rest HR: 57 {beats}/min
SDS: 2
SRS: 0
SSS: 2
TID: 0.93

## 2018-07-23 MED ORDER — TECHNETIUM TC 99M TETROFOSMIN IV KIT
32.6000 | PACK | Freq: Once | INTRAVENOUS | Status: AC | PRN
  Administered 2018-07-23: 32.6 via INTRAVENOUS
  Filled 2018-07-23: qty 33

## 2018-07-25 ENCOUNTER — Ambulatory Visit (HOSPITAL_COMMUNITY): Payer: BC Managed Care – PPO

## 2018-08-06 NOTE — Telephone Encounter (Signed)
err

## 2018-08-13 ENCOUNTER — Telehealth (INDEPENDENT_AMBULATORY_CARE_PROVIDER_SITE_OTHER): Payer: BC Managed Care – PPO | Admitting: Internal Medicine

## 2018-08-13 DIAGNOSIS — I10 Essential (primary) hypertension: Secondary | ICD-10-CM | POA: Diagnosis not present

## 2018-08-13 DIAGNOSIS — E785 Hyperlipidemia, unspecified: Secondary | ICD-10-CM

## 2018-08-13 NOTE — Progress Notes (Signed)
Virtual Visit via Video Note   This visit type was conducted due to national recommendations for restrictions regarding the COVID-19 Pandemic (e.g. social distancing) in an effort to limit this patient's exposure and mitigate transmission in our community.  Due to his co-morbid illnesses, this patient is at least at moderate risk for complications without adequate follow up.  This format is felt to be most appropriate for this patient at this time.  All issues noted in this document were discussed and addressed.  A limited physical exam was performed with this format.  Please refer to the patient's chart for his consent to telehealth for [facility].   Date:  08/13/2018   ID:  Aaron Frost, DOB 1980/01/17, MRN [id number]  Patient Location: Home Provider Location: Home  PCP:  Inda Coke, [place]  Cardiologist:  Elouise Munroe, MD  Electrophysiologist:  None   Evaluation Performed:  Follow-Up Visit  Chief Complaint:  F/u test results  History of Present Illness:    Aaron Frost is a 38 y.o. male with OSA on CPAP, HTN, asthma, depression, morbid obesity, and reflux who presents today for evaluation of chest pain. He had 2 weeks of chest pain in March, and fortunately has had none since that time. He describes the chest pain as left sided, intermittent, no identifiable provoking or relieving factors. Left shoulder pain as well. Does not worsen with deep breathing. Lasts seconds to minutes, comes and goes. He is a former smoker.   We reviewed his echocardiogram which showed moderate LVH, normal ejection fraction, and normal diastolic function.  Right side of the heart look normal and fortunately no evidence of elevated pulmonary pressures were seen.  His stress test was intermediate risk due to a low calculation of ejection fraction and a fixed defect at the apex with evidence of wall motion abnormality.  Correlation with echocardiogram was recommended for those reasons the  test was felt to be intermediate risk.  The patient and I have participated in shared decision making regarding these results at this time the patient is reassured that there is no significant ischemia noted and ejection fraction looks normal on echo.  It is likely given the patient's body habitus that these findings may represent artifact.  We have agreed on a strategy of cautious observation of symptoms.  We will have a low threshold to perform alternate testing if chest pain recurs.  Fortunately he has not had a recurrence of symptoms and this ischemic evaluation was delayed in the setting of COVID-19 restrictions.  He recently moved his home and feels that now that he is more settled his stress level has decreased as well.  The patient does not have symptoms concerning for COVID-19 infection (fever, chills, cough, or new shortness of breath).    Past Medical History:  Diagnosis Date   Asthma 08/09/2017   OSA on CPAP    Umbilical hernia 9030   Past Surgical History:  Procedure Laterality Date   EXTERNAL EAR SURGERY     right ear reconstruction   MOLE REMOVAL     Baco of Neck   TONSILLECTOMY  2005     Current Meds  Medication Sig   acetaminophen (TYLENOL) 500 MG tablet Take 500 mg by mouth every 6 (six) hours as needed for mild pain.    fluticasone (FLONASE) 50 MCG/ACT nasal spray Place 2 sprays into both nostrils daily. (Patient taking differently: Place 2 sprays into both nostrils daily as needed. )   montelukast (SINGULAIR) 10 MG  tablet Take 1 tablet (10 mg total) by mouth at bedtime. (Patient taking differently: Take 10 mg by mouth daily as needed. )   Multiple Vitamin (MULTIVITAMIN WITH MINERALS) TABS tablet Take 1 tablet by mouth daily.   omeprazole (PRILOSEC OTC) 20 MG tablet Take 20 mg by mouth daily as needed.    valsartan (DIOVAN) 40 MG tablet Take 1 tablet (40 mg total) by mouth daily.   Zinc 30 MG CAPS Take 30 mg by mouth.     Allergies:   Aleve [naproxen] and  Nickel   Social History   Tobacco Use   Smoking status: Former Smoker    Packs/day: 1.50    Years: 3.00    Pack years: 4.50    Types: Cigarettes    Quit date: 01/09/2004    Years since quitting: 14.6   Smokeless tobacco: Never Used  Substance Use Topics   Alcohol use: Yes    Comment: very rare glass of wine   Drug use: No     Family Hx: The patient's family history includes Cancer in his sister; Cancer (age of onset: 64) in his paternal grandmother; Colon cancer (age of onset: 69) in his paternal grandfather; Diabetes in his maternal grandfather; Heart disease in his father and paternal grandfather; Hypertension in his mother; Stroke in his paternal grandfather. There is no history of Breast cancer or Prostate cancer.  ROS:   Please see the history of present illness.     All other systems reviewed and are negative.   Prior CV studies:   The following studies were reviewed today:    Labs/Other Tests and Data Reviewed:    EKG:  No ECG reviewed.  Recent Labs: 08/16/2017: Magnesium 2.5 03/11/2018: ALT 34; BUN 14; Creatinine, Ser 0.85; Hemoglobin 15.9; Platelets 240.0; Potassium 4.2; Sodium 136; TSH 2.03   Recent Lipid Panel Lab Results  Component Value Date/Time   CHOL 173 12/26/2017 09:06 AM   TRIG 86.0 12/26/2017 09:06 AM   HDL 42.50 12/26/2017 09:06 AM   CHOLHDL 4 12/26/2017 09:06 AM   LDLCALC 114 (H) 12/26/2017 09:06 AM    Wt Readings from Last 3 Encounters:  07/21/18 (!) 407 lb (184.6 kg)  07/04/18 (!) 407 lb (184.6 kg)  03/13/18 (!) 431 lb (195.5 kg)     Objective:    Vital Signs:  There were no vitals taken for this visit.   VITAL SIGNS:  reviewed GEN:  no acute distress EYES:  sclerae anicteric, EOMI - Extraocular Movements Intact RESPIRATORY:  normal respiratory effort, symmetric expansion CARDIOVASCULAR:  no peripheral edema SKIN:  no rash, lesions or ulcers. MUSCULOSKELETAL:  no obvious deformities. NEURO:  alert and oriented x 3, no obvious  focal deficit PSYCH:  normal affect  ASSESSMENT & PLAN:    1. Morbid obesity ([place])   2. Essential hypertension   3. Hyperlipidemia, unspecified hyperlipidemia type    Chest pain, resolved- both echocardiogram and stress testing are not suggestive of significant ischemia or structural abnormalities.  We will cautiously observe his symptoms if they recur, I would consider an alternate method of stress testing such as coronary CTA to evaluate for obstructive CAD.  Hypertension-continue valsartan, no concerns at this time.  Hyperlipidemia- patient had elevated lipids at the end of 2019 and is not currently on lipid-lowering therapy.  Would be prudent to repeat a lipid panel after the patient has had an opportunity to perform diet lifestyle modifications which he is actively pursuing in addition to a bariatric work-up.  He is  interested in meeting with a dietitian we can certainly arrange this for him.  COVID-19 Education: The signs and symptoms of COVID-19 were discussed with the patient and how to seek care for testing (follow up with PCP or arrange E-visit).  The importance of social distancing was discussed today.  Time:   Today, I have spent 25 minutes with the patient with telehealth technology discussing the above problems.     Medication Adjustments/Labs and Tests Ordered: Current medicines are reviewed at length with the patient today.  Concerns regarding medicines are outlined above.   Tests Ordered: No orders of the defined types were placed in this encounter.   Medication Changes: No orders of the defined types were placed in this encounter.   Follow Up:  4 months  Signed, Elouise Munroe, MD  08/13/2018 8:05 AM    [place]

## 2018-08-13 NOTE — Patient Instructions (Addendum)
Medication Instructions:  Your Physician recommend you continue on your current medication as directed.    If you need a refill on your cardiac medications before your next appointment, please call your pharmacy.   Lab work: Your physician recommends that you return for lab work in 4 months ( fasting lipids)  If you have labs (blood work) drawn today and your tests are completely normal, you will receive your results only by: Marland Kitchen MyChart Message (if you have MyChart) OR . A paper copy in the mail If you have any lab test that is abnormal or we need to change your treatment, we will call you to review the results.  Testing/Procedures: None  Follow-Up: At [facility], you and your health needs are our priority.  As part of our continuing mission to provide you with exceptional heart care, we have created designated Provider Care Teams.  These Care Teams include your primary Cardiologist (physician) and Advanced Practice Providers (APPs -  Physician Assistants and Nurse Practitioners) who all work together to provide you with the care you need, when you need it. You will need a follow up appointment in 4 months.  Please call our office 2 months in advance to schedule this appointment.  You may see Elouise Munroe, MD or one of the following Advanced Practice Providers on your designated Care Team:   Rosaria Ferries, PA-C . Jory Sims, DNP, ANP

## 2018-09-03 ENCOUNTER — Ambulatory Visit: Payer: BC Managed Care – PPO | Admitting: Skilled Nursing Facility1

## 2018-10-08 ENCOUNTER — Ambulatory Visit: Payer: BC Managed Care – PPO | Admitting: Pulmonary Disease

## 2018-10-08 ENCOUNTER — Encounter: Payer: Self-pay | Admitting: Pulmonary Disease

## 2018-10-08 ENCOUNTER — Other Ambulatory Visit: Payer: Self-pay

## 2018-10-08 ENCOUNTER — Encounter: Payer: BC Managed Care – PPO | Attending: Physician Assistant | Admitting: Dietician

## 2018-10-08 ENCOUNTER — Encounter: Payer: Self-pay | Admitting: Dietician

## 2018-10-08 VITALS — BP 132/78 | HR 76 | Temp 97.3°F | Ht 73.0 in | Wt >= 6400 oz

## 2018-10-08 DIAGNOSIS — Z23 Encounter for immunization: Secondary | ICD-10-CM

## 2018-10-08 DIAGNOSIS — G4733 Obstructive sleep apnea (adult) (pediatric): Secondary | ICD-10-CM

## 2018-10-08 DIAGNOSIS — Z9989 Dependence on other enabling machines and devices: Secondary | ICD-10-CM

## 2018-10-08 DIAGNOSIS — E669 Obesity, unspecified: Secondary | ICD-10-CM | POA: Diagnosis not present

## 2018-10-08 NOTE — Progress Notes (Signed)
Medical Nutrition Therapy  Appt Start Time: 2:00pm   End Time: 3:00pm  Primary concerns today: food choices and meal ideas for weight maintenance and prediabetes   Referral diagnosis: E66.01- morbid obesity;  I10- essential hypertension Preferred learning style: no preference indicated Learning readiness: ready   NUTRITION ASSESSMENT   Clinical Medical Hx: obesity, asthma, HTN, sleep apnea, prediabetes  Lifestyle & Dietary Hx Pt arrived to appointment today by himself. States he recently noticed he struggles with anxiety, primarily related to certain health concerns such as high blood pressure and prediabetes, as well as hernia. States he is considering pursuing bariatric surgery. Would like to try to lose weight via diet and exercise and see how successful he is with that first.   States he is divorced so he only cooks for himself. Patient seems knowledgable about nutrition, for example pointing out things such as complex carbohydrates, understanding the importance of fiber, and trying to balance his meals (especially breakfast) with protein. Dislikes fruit. Eats "late" breakfast around 9 or 10am. Prior to COVID-19, went to work in person where food was catered and snacks were available, so working from home for the past few months has been helpful in taking those readily available/unhealthy foods away. Previously drank lots of diet sodas, now focuses on water and will also drink coffee/hot tea and low-calorie flavored water. Prefers to make a meal and have it last for a few more meals throughout the week.   States he usually does not eat "a lot" of food at once since it causes stomach pain.    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    NUTRITION INTERVENTION  Nutrition education (E-1) on the following topics:  . Balanced, healthful eating for weight maintenance and blood sugar stabilization   Handouts Provided Include   MyPlate & Meal Ideas  Sample 2-Day Meal Plan (2,200 kcal)   Learning Style & Readiness for Change Teaching method utilized: Visual & Auditory  Demonstrated degree of understanding via: Teach Back  Barriers to learning/adherence to lifestyle change: None Identified   Goals Established by Pt . Focus on building balanced meals by incorporating vegetables, complex carbohydrates, and lean protein    MONITORING & EVALUATION Dietary intake, weekly physical activity, and goals prn.  RD's Notes for Next Visit  . Balanced Snacks  . Prediabetes-Focused MNT  Next Steps  Patient is to return to [facility] for follow up visit as desired. Pt states he would like time to work on goals before calling to schedule his next appointment.

## 2018-10-08 NOTE — Patient Instructions (Signed)
Obstructive sleep apnea  Continue CPAP Continue weight loss efforts  Flu shot today  I will follow-up with you in 1 year

## 2018-10-08 NOTE — Progress Notes (Signed)
Subjective:    Patient ID: Emron Reckner, male    DOB: [birth year], 38 y.o.   MRN: [id number]  Chief complaint: History of obstructive sleep apnea, improved compliance with CPAP  No significant health issues since her last visit Generally better, more active  HPI Multiple comorbidities on oxygen supplementation since recent hospitalization-has not been using oxygen, feels he does not need it any longer He has a history of obstructive sleep apnea was very compliant with CPAP, as resumed using CPAP on a regular basis, has no concerns with the pressure settings  This is his oxygen he has consistently run in the 90s and he has not been using oxygen supplementation  History of multiple allergies History of hypertension History of asthma History of depression, morbid obesity History of reflux  No recent significant concerns with his health No recent hospitalizations or hospital visits   Review of Systems  Constitutional: Negative for fever and unexpected weight change.  Eyes: Negative for redness and itching.  Respiratory: Negative for cough and shortness of breath.   Cardiovascular: Negative for chest pain.  Genitourinary: Negative for dysuria.  Musculoskeletal: Negative for joint swelling.  Skin: Negative for rash.  Allergic/Immunologic: Negative.    Past Medical History:  Diagnosis Date  . Asthma 08/09/2017  . OSA on CPAP   . Umbilical hernia [id number]   Social History   Socioeconomic History  . Marital status: Divorced    Spouse name: Not on file  . Number of children: Not on file  . Years of education: Not on file  . Highest education level: Not on file  Occupational History  . Occupation: Research scientist (physical sciences): Sports coach  Social Needs  . Financial resource strain: Not on file  . Food insecurity    Worry: Not on file    Inability: Not on file  . Transportation needs    Medical: Not on file    Non-medical: Not on file  Tobacco Use  . Smoking status:  Former Smoker    Packs/day: 1.50    Years: 3.00    Pack years: 4.50    Types: Cigarettes    Quit date: 01/09/2004    Years since quitting: 14.7  . Smokeless tobacco: Never Used  Substance and Sexual Activity  . Alcohol use: Yes    Comment: very rare glass of wine  . Drug use: No  . Sexual activity: Never  Lifestyle  . Physical activity    Days per week: Not on file    Minutes per session: Not on file  . Stress: Not on file  Relationships  . Social Herbalist on phone: Not on file    Gets together: Not on file    Attends religious service: Not on file    Active member of club or organization: Not on file    Attends meetings of clubs or organizations: Not on file    Relationship status: Not on file  . Intimate partner violence    Fear of current or ex partner: Not on file    Emotionally abused: Not on file    Physically abused: Not on file    Forced sexual activity: Not on file  Other Topics Concern  . Not on file  Social History Narrative   Work for [employer] -- sedentary   Divorced, Dec 2015    Live in [place]   Fun: walk, watch TV   Current Outpatient Medications on File Prior to Visit  Medication  Sig Dispense Refill  . acetaminophen (TYLENOL) 500 MG tablet Take 500 mg by mouth every 6 (six) hours as needed for mild pain.     . fluticasone (FLONASE) 50 MCG/ACT nasal spray Place 2 sprays into both nostrils daily. (Patient taking differently: Place 2 sprays into both nostrils daily as needed. ) 16 g 6  . metFORMIN (GLUCOPHAGE XR) 500 MG 24 hr tablet Start 500mg  PO qpm. 90 tablet 3  . montelukast (SINGULAIR) 10 MG tablet Take 1 tablet (10 mg total) by mouth at bedtime. (Patient taking differently: Take 10 mg by mouth daily as needed. ) 90 tablet 0  . Multiple Vitamin (MULTIVITAMIN WITH MINERALS) TABS tablet Take 1 tablet by mouth daily.    Marland Kitchen omeprazole (PRILOSEC OTC) 20 MG tablet Take 20 mg by mouth daily as needed.     . valsartan (DIOVAN) 40 MG tablet  Take 1 tablet (40 mg total) by mouth daily. 90 tablet 1  . Zinc 30 MG CAPS Take 30 mg by mouth.    . [DISCONTINUED] hydrochlorothiazide 25 MG tablet Take 1 tablet (25 mg total) by mouth daily. 30 tablet 11   No current facility-administered medications on file prior to visit.     Vitals:   10/08/18 1552  BP: 132/78  Pulse: 76  Temp: (!) 97.3 F (36.3 C)  SpO2: 97%  ls     Objective:   Physical Exam Constitutional:      General: He is not in acute distress.    Appearance: He is not diaphoretic.     Comments: Obese  HENT:     Head: Normocephalic and atraumatic.     Nose: No congestion or rhinorrhea.  Eyes:     General:        Right eye: No discharge.     Extraocular Movements: Extraocular movements intact.     Pupils: Pupils are equal, round, and reactive to light.  Neck:     Musculoskeletal: Normal range of motion. No neck rigidity.     Thyroid: No thyromegaly.     Trachea: No tracheal deviation.  Cardiovascular:     Rate and Rhythm: Normal rate and regular rhythm.     Heart sounds: No murmur.  Pulmonary:     Effort: Pulmonary effort is normal. No respiratory distress.     Breath sounds: No stridor. No wheezing, rhonchi or rales.      Recent chest x-ray 08/11/2017-reviewed, no acute infiltrate  Recent EKG unremarkable  Assessment & Plan:  Marland Kitchen  Morbid obesity -Working on weight loss  .  Asthma -Has had stable symptoms -No significant change in bronchodilator requirement  .  Obstructive sleep apnea -Compliant with CPAP use -Not having any significant issues  .  Environmental allergies  Plan:  Continue CPAP use  Encouraged about weight loss efforts   Prescription for CPAP supplies   Follow-up in a year

## 2018-11-25 ENCOUNTER — Other Ambulatory Visit: Payer: Self-pay

## 2018-11-25 DIAGNOSIS — Z20822 Contact with and (suspected) exposure to covid-19: Secondary | ICD-10-CM

## 2018-11-27 LAB — NOVEL CORONAVIRUS, NAA: SARS-CoV-2, NAA: NOT DETECTED

## 2018-12-15 ENCOUNTER — Ambulatory Visit: Payer: BC Managed Care – PPO | Admitting: Internal Medicine

## 2019-01-12 ENCOUNTER — Telehealth: Payer: Self-pay | Admitting: Pulmonary Disease

## 2019-01-12 NOTE — Telephone Encounter (Signed)
Yes please set up for video visit for further evaluation .

## 2019-01-12 NOTE — Telephone Encounter (Signed)
Primary Pulmonologist: AO Last office visit and with whom: 10/08/18 with AO What do we see them for (pulmonary problems): OSA Last OV assessment/plan: Instructions  Obstructive sleep apnea  Continue CPAP Continue weight loss efforts  Flu shot today  I will follow-up with you in 1 year     Was appointment offered to patient (explain)?  Pt wants recommendations. Pt scheduled for covid test already tomorrow 1/5 at 3:30   Reason for call: Called and spoke with pt who stated drainage began to really bother him about 1 week ago and stated he is coughing up yellow to green phlegm. Pt states he has been having some wheezing and also has had slight chills which he said he usually get when he has coughing issues.  Pt denies any complaints of fever and states that his temp today was 98.7. pt said he does take tylenol prn and stated the last time he took tylenol was last night 1/3 prior to going to bed.  Pt said his O2 sats have been good ranging in the mid 90s at rest but at times with activities it will drop to the high 80s.  Pt said that he has already called and scheduled a covid test for tomorrow afternoon at [facility] at 3:30.  Due to pt's current symptoms, he wants to know recommendations we could give. Tammy, please advise on this for pt. Thanks!  (examples of things to ask: : When did symptoms start? Fever? Cough? Productive? Color to sputum? More sputum than usual? Wheezing? Have you needed increased oxygen? Are you taking your respiratory medications? What over the counter measures have you tried?)

## 2019-01-12 NOTE — Telephone Encounter (Signed)
Pt returned call & can be reached at [phone].

## 2019-01-12 NOTE — Telephone Encounter (Signed)
Attempted to call pt but unable to reach. Left pt a detailed message letting him know that we needed to get him scheduled for a video visit in the AM to further evaluate.  When pt returns call, please schedule him for a video visit in the AM 1/5 with APP. Thanks!

## 2019-01-12 NOTE — Telephone Encounter (Signed)
Called pt but unable to reach. Left message for pt to return call. 

## 2019-01-12 NOTE — Telephone Encounter (Signed)
Please set up for visit  Please contact office for sooner follow up if symptoms do not improve or worsen or seek emergency care

## 2019-01-13 ENCOUNTER — Ambulatory Visit: Payer: BC Managed Care – PPO | Attending: Internal Medicine

## 2019-01-13 ENCOUNTER — Telehealth (INDEPENDENT_AMBULATORY_CARE_PROVIDER_SITE_OTHER): Payer: BC Managed Care – PPO | Admitting: Adult Health

## 2019-01-13 ENCOUNTER — Encounter: Payer: Self-pay | Admitting: Adult Health

## 2019-01-13 DIAGNOSIS — J302 Other seasonal allergic rhinitis: Secondary | ICD-10-CM

## 2019-01-13 DIAGNOSIS — Z9989 Dependence on other enabling machines and devices: Secondary | ICD-10-CM

## 2019-01-13 DIAGNOSIS — Z20822 Contact with and (suspected) exposure to covid-19: Secondary | ICD-10-CM

## 2019-01-13 DIAGNOSIS — J45901 Unspecified asthma with (acute) exacerbation: Secondary | ICD-10-CM | POA: Diagnosis not present

## 2019-01-13 DIAGNOSIS — G4733 Obstructive sleep apnea (adult) (pediatric): Secondary | ICD-10-CM

## 2019-01-13 MED ORDER — ALBUTEROL SULFATE (2.5 MG/3ML) 0.083% IN NEBU: INHALATION_SOLUTION | RESPIRATORY_TRACT | 5 refills | Status: DC

## 2019-01-13 MED ORDER — PREDNISONE 20 MG PO TABS: ORAL_TABLET | ORAL | 0 refills | Status: DC

## 2019-01-13 MED ORDER — ALBUTEROL SULFATE HFA 108 (90 BASE) MCG/ACT IN AERS: INHALATION_SPRAY | RESPIRATORY_TRACT | 5 refills | Status: DC

## 2019-01-13 MED ORDER — AZITHROMYCIN 250 MG PO TABS: ORAL_TABLET | ORAL | 0 refills | Status: AC

## 2019-01-13 NOTE — Telephone Encounter (Addendum)
Spoke with pt, he already had a video visit today and nothing further is needed.

## 2019-01-13 NOTE — Patient Instructions (Signed)
Zpack take as directed.  Mucinex DM Twice daily  As needed  Cough/congestion  Fluids and rest  Saline nasal rinses As needed   COVID 19 testing today as planned Self Quarantine until results are returned.  Albuterol inhaler or Neb every 4-6 hr as needed for wheezing  Prednisone 40mg  daily for 3 days , take with food.  Follow up with Dr. Hermina Staggers in 3-4 months and As needed   Please contact office for sooner follow up if symptoms do not improve or worsen or seek emergency care

## 2019-01-13 NOTE — Progress Notes (Signed)
Virtual Visit via Video Note  I connected with Aaron Frost on 01/13/19 at  9:30 AM EST by a video enabled telemedicine application and verified that I am speaking with the correct person using two identifiers.  Location: Patient: Home  provider: Office   I discussed the limitations of evaluation and management by telemedicine and the availability of in person appointments. The patient expressed understanding and agreed to proceed.  History of Present Illness: 39 year old male former smoker followed for obstructive sleep apnea, intermittent asthma and allergic rhinitis Medical history significant for hypertension, morbid obesity Hospitalization August 2019 for asthmatic bronchitic exacerbation and acute respiratory failure with hypoxemia   Today's video visit is for an acute office visit.  Patient complains over the last 5 days he has had increased postnasal drainage nasal congestion and drainage, productive cough with thick yellow-green mucus.  Has had increased wheezing and shortness of breath.  P patient says he has been checking his O2 saturations and have been doing well above 90%.  Today O2 saturations were 95 to 96% on room air.  Patient is not on home oxygen.  Patient says he has had some intermittent chills.  Has had no fever.  Has had no loss of taste or smell.  Has no known sick contacts.  No recent travel.  Patient says he did shop over the holidays .  He has been using his maintenance medications including Singulair and Flonase.  Has taken Mucinex and Tylenol.  Is also used his albuterol inhaler and albuterol nebulizer.  He does need refills of both of these.  Patient has set up Covid 19 testing for later today.  Patient denies any chest pain orthopnea PND or increased leg swelling.  No hemoptysis.  Appetite is good with no nausea vomiting or diarrhea.  Patient Active Problem List   Diagnosis Date Noted  . Acute hypoxemic respiratory failure ([name]) 08/30/2017  . Normocytic  anemia 08/10/2017  . Hyperglycemia 08/10/2017  . Asthma 08/09/2017  . BMI 50.0-59.9, adult ([place]) 04/05/2016  . Hemorrhoids 04/05/2016  . Essential hypertension 04/05/2016  . Gastroesophageal reflux disease 04/05/2016  . Ventral hernia 12/24/2012  . Pain of left heel 11/24/2011  . OSA on CPAP 10/08/2011  . Leg swelling 10/07/2011  . Radicular pain in left arm 10/07/2011  . Obesity, unspecified 06/10/2007  . DEPRESSION 06/10/2007  . Allergic rhinitis 06/10/2007   Current Outpatient Medications on File Prior to Visit  Medication Sig Dispense Refill  . acetaminophen (TYLENOL) 500 MG tablet Take 500 mg by mouth every 6 (six) hours as needed for mild pain.     . fluticasone (FLONASE) 50 MCG/ACT nasal spray Place 2 sprays into both nostrils daily. (Patient taking differently: Place 2 sprays into both nostrils daily as needed. ) 16 g 6  . montelukast (SINGULAIR) 10 MG tablet Take 1 tablet (10 mg total) by mouth at bedtime. (Patient taking differently: Take 10 mg by mouth daily as needed. ) 90 tablet 0  . Multiple Vitamin (MULTIVITAMIN WITH MINERALS) TABS tablet Take 1 tablet by mouth daily.    Marland Kitchen omeprazole (PRILOSEC OTC) 20 MG tablet Take 20 mg by mouth daily as needed.     . valsartan (DIOVAN) 40 MG tablet Take 1 tablet (40 mg total) by mouth daily. 90 tablet 1  . vitamin B-12 (CYANOCOBALAMIN) 1000 MCG tablet Take 1,000 mcg by mouth daily.    . Zinc 30 MG CAPS Take 30 mg by mouth.    . metFORMIN (GLUCOPHAGE XR) 500 MG 24 hr  tablet Start 500mg  PO qpm. (Patient not taking: Reported on 01/13/2019) 90 tablet 3  . [DISCONTINUED] hydrochlorothiazide 25 MG tablet Take 1 tablet (25 mg total) by mouth daily. 30 tablet 11   No current facility-administered medications on file prior to visit.    Observations/Objective: O2 saturations 95 to 96% on room air.  Pulse oximeter was visualized.  Patient appears comfortable with no increased work of breathing and no visible accessory use.  He appears  comfortable and alert and oriented on video.  Assessment and Plan: Acute asthmatic bronchitic exacerbation.  Patient does have acute respiratory symptoms agree with going ahead for COVID-19 testing today.  Patient is to self quarantine until results are returned.  Have advised him if test comes back positive to notify our office immediately.  As patient is high risk for decompensation.  Would consider outpatient monoclonal antibody infusion if positive. For now we will treat for asthmatic bronchitic exacerbation with Z-Pak and short prednisone burst.  Continue on supportive care with Mucinex and Flonase.  Add in saline nasal rinses.  May use albuterol inhaler or nebulizer as needed.  Refills were sent to the pharmacy.  OSA.  Patient is compliant with CPAP.  Continue on current settings.  No changes.  Patient is advised on CPAP mask cleaning.  Possible COVID 19 -test pending for today .  Self quarantine practices discussed.  Please notify office if positive  PLAN  Patient Instructions  Zpack take as directed.  Mucinex DM Twice daily  As needed  Cough/congestion  Fluids and rest  Saline nasal rinses As needed   COVID 19 testing today as planned Self Quarantine until results are returned.  Albuterol inhaler or Neb every 4-6 hr as needed for wheezing  Prednisone 40mg  daily for 3 days , take with food.  Follow up with Dr. Hermina Staggers in 3-4 months and As needed   Please contact office for sooner follow up if symptoms do not improve or worsen or seek emergency care        Follow Up Instructions: Follow-up in 3 to 4 months and as needed. Please contact office for sooner follow up if symptoms do not improve or worsen or seek emergency care   Patient is aware to notify us if his COVID-19 test comes back positive.  Continue to self quarantine until results are available. I discussed the assessment and treatment plan with the patient. The patient was provided an opportunity to ask questions and  all were answered. The patient agreed with the plan and demonstrated an understanding of the instructions.   The patient was advised to call back or seek an in-person evaluation if the symptoms worsen or if the condition fails to improve as anticipated.  I provided 32 minutes of non-face-to-face time during this encounter.   Rexene Edison, NP

## 2019-01-14 ENCOUNTER — Ambulatory Visit: Payer: BC Managed Care – PPO | Admitting: Internal Medicine

## 2019-01-14 LAB — NOVEL CORONAVIRUS, NAA: SARS-CoV-2, NAA: NOT DETECTED

## 2019-02-16 ENCOUNTER — Other Ambulatory Visit: Payer: Self-pay

## 2019-02-16 ENCOUNTER — Ambulatory Visit: Payer: BC Managed Care – PPO | Admitting: Internal Medicine

## 2019-02-16 ENCOUNTER — Encounter: Payer: Self-pay | Admitting: Internal Medicine

## 2019-02-16 VITALS — BP 141/88 | HR 68 | Temp 95.5°F | Ht 73.0 in | Wt >= 6400 oz

## 2019-02-16 DIAGNOSIS — I10 Essential (primary) hypertension: Secondary | ICD-10-CM | POA: Diagnosis not present

## 2019-02-16 DIAGNOSIS — G4730 Sleep apnea, unspecified: Secondary | ICD-10-CM | POA: Diagnosis not present

## 2019-02-16 DIAGNOSIS — E785 Hyperlipidemia, unspecified: Secondary | ICD-10-CM

## 2019-02-16 DIAGNOSIS — R0609 Other forms of dyspnea: Secondary | ICD-10-CM

## 2019-02-16 DIAGNOSIS — R079 Chest pain, unspecified: Secondary | ICD-10-CM

## 2019-02-16 DIAGNOSIS — Z6841 Body Mass Index (BMI) 40.0 and over, adult: Secondary | ICD-10-CM

## 2019-02-16 DIAGNOSIS — R0600 Dyspnea, unspecified: Secondary | ICD-10-CM | POA: Diagnosis not present

## 2019-02-16 NOTE — Patient Instructions (Signed)
Medication Instructions:  No changes *If you need a refill on your cardiac medications before your next appointment, please call your pharmacy*  Lab Work: Not needed  Testing/Procedures: Not needed   Follow-Up: At [facility], you and your health needs are our priority.  As part of our continuing mission to provide you with exceptional heart care, we have created designated Provider Care Teams.  These Care Teams include your primary Cardiologist (physician) and Advanced Practice Providers (APPs -  Physician Assistants and Nurse Practitioners) who all work together to provide you with the care you need, when you need it.  Your next appointment:   3 month(s)  The format for your next appointment:   Either In Person or Virtual  Provider:   Cherlynn Kaiser, MD  Other Instructions n/a

## 2019-02-16 NOTE — Progress Notes (Signed)
Cardiology Office Note:    Date:  02/16/2019   ID:  Aaron Frost, DOB 05-25-1980, MRN [id number]  PCP:  Inda Coke, PA  Cardiologist:  Elouise Munroe, MD  Electrophysiologist:  None   Referring MD: Inda Coke, PA   Chief Complaint: f/u chest pain and HTN.  History of Present Illness:    Aaron Frost is a 39 y.o. male with a history of OSA on CPAP, HTN, asthma, depression, morbid obesity, and reflux who presents today for evaluation of chest pain.  He has had no significant symptoms of chest pain since our last visit.  He is primarily concerned about hypoxia and shortness of breath with exertion.  He is doing a great job taking precautions for Covid, wearing a mask and goggles today.  He tells me his sister is pregnant at [redacted] weeks with twins and is keen to social distance and stay safe.  He has attempted slightly more physical activity but finds it difficult to do.  He is short of breath with exertion.  We discussed the results of stress testing again with the possible artifact versus infarct, no ischemia.  We discussed the echo results again which was mostly remarkable for a moderately increased left ventricular wall thickness.  We discussed red flag symptoms to present to the ER with chest pain.  Blood pressure mildly elevated today, however he states that he thinks it is higher than he records at his primary doctor.  He takes valsartan 40 mg daily and takes hydrochlorothiazide as needed for swelling.  Past Medical History:  Diagnosis Date  . Asthma 08/09/2017  . OSA on CPAP   . Umbilical hernia [id number]    Past Surgical History:  Procedure Laterality Date  . EXTERNAL EAR SURGERY     right ear reconstruction  . MOLE REMOVAL     Baco of Neck  . TONSILLECTOMY  2005    Current Medications: Current Meds  Medication Sig  . acetaminophen (TYLENOL) 500 MG tablet Take 500 mg by mouth every 6 (six) hours as needed for mild pain.   Marland Kitchen albuterol (PROVENTIL) (2.5  MG/3ML) 0.083% nebulizer solution Take 1 vial by nebulization every 4-6 hours as needed for wheezing  . albuterol (VENTOLIN HFA) 108 (90 Base) MCG/ACT inhaler Inhale 2 puffs every 4-6 hours as needed for wheezing  . fluticasone (FLONASE) 50 MCG/ACT nasal spray Place 2 sprays into both nostrils daily. (Patient taking differently: Place 2 sprays into both nostrils daily as needed. )  . metFORMIN (GLUCOPHAGE XR) 500 MG 24 hr tablet Start 500mg  PO qpm.  . montelukast (SINGULAIR) 10 MG tablet Take 1 tablet (10 mg total) by mouth at bedtime. (Patient taking differently: Take 10 mg by mouth daily as needed. )  . Multiple Vitamin (MULTIVITAMIN WITH MINERALS) TABS tablet Take 1 tablet by mouth daily.  Marland Kitchen omeprazole (PRILOSEC OTC) 20 MG tablet Take 20 mg by mouth daily as needed.   . valsartan (DIOVAN) 40 MG tablet Take 1 tablet (40 mg total) by mouth daily.  . vitamin B-12 (CYANOCOBALAMIN) 1000 MCG tablet Take 1,000 mcg by mouth daily.  . Zinc 30 MG CAPS Take 30 mg by mouth.     Allergies:   Aleve [naproxen] and Nickel   Social History   Socioeconomic History  . Marital status: Divorced    Spouse name: Not on file  . Number of children: Not on file  . Years of education: Not on file  . Highest education level: Not on file  Occupational History  .  Occupation: Research scientist (physical sciences): [employer]  Tobacco Use  . Smoking status: Former Smoker    Packs/day: 1.50    Years: 3.00    Pack years: 4.50    Types: Cigarettes    Quit date: 01/09/2004    Years since quitting: 15.1  . Smokeless tobacco: Never Used  Substance and Sexual Activity  . Alcohol use: Yes    Comment: very rare glass of wine  . Drug use: No  . Sexual activity: Never  Other Topics Concern  . Not on file  Social History Narrative   Work for [employer] -- sedentary   Divorced, Dec 2015    Live in [place]: walk, watch TV   Social Determinants of Health   Financial Resource Strain:   . Difficulty of Paying  Living Expenses: Not on file  Food Insecurity:   . Worried About Charity fundraiser in the Last Year: Not on file  . Ran Out of Food in the Last Year: Not on file  Transportation Needs:   . Lack of Transportation (Medical): Not on file  . Lack of Transportation (Non-Medical): Not on file  Physical Activity:   . Days of Exercise per Week: Not on file  . Minutes of Exercise per Session: Not on file  Stress:   . Feeling of Stress : Not on file  Social Connections:   . Frequency of Communication with Friends and Family: Not on file  . Frequency of Social Gatherings with Friends and Family: Not on file  . Attends Religious Services: Not on file  . Active Member of Clubs or Organizations: Not on file  . Attends Archivist Meetings: Not on file  . Marital Status: Not on file     Family History: The patient's family history includes Cancer in his sister; Cancer (age of onset: 45) in his paternal grandmother; Colon cancer (age of onset: 77) in his paternal grandfather; Diabetes in his maternal grandfather; Heart disease in his father and paternal grandfather; Hypertension in his mother; Stroke in his paternal grandfather. There is no history of Breast cancer or Prostate cancer.  ROS:   Please see the history of present illness.    All other systems reviewed and are negative.  EKGs/Labs/Other Studies Reviewed:    The following studies were reviewed today:  EKG: Normal sinus rhythm, rate 68  Recent Labs: 03/11/2018: ALT 34; BUN 14; Creatinine, Ser 0.85; Hemoglobin 15.9; Platelets 240.0; Potassium 4.2; Sodium 136; TSH 2.03  Recent Lipid Panel    Component Value Date/Time   CHOL 173 12/26/2017 0906   TRIG 86.0 12/26/2017 0906   HDL 42.50 12/26/2017 0906   CHOLHDL 4 12/26/2017 0906   VLDL 17.2 12/26/2017 0906   LDLCALC 114 (H) 12/26/2017 0906    Physical Exam:    VS:  BP (!) 141/88   Pulse 68   Temp (!) 95.5 F (35.3 C)   Ht 6\' 1"  (1.854 m)   Wt (!) 451 lb 12.8 oz  (204.9 kg)   SpO2 97%   BMI 59.61 kg/m     Wt Readings from Last 5 Encounters:  02/16/19 (!) 451 lb 12.8 oz (204.9 kg)  10/08/18 (!) 428 lb 9.6 oz (194.4 kg)  07/21/18 (!) 407 lb (184.6 kg)  07/04/18 (!) 407 lb (184.6 kg)  03/13/18 (!) 431 lb (195.5 kg)     Constitutional: No acute distress Eyes: sclera non-icteric, normal conjunctiva and lids ENMT: normal dentition, moist mucous membranes  Cardiovascular: regular rhythm, normal rate, no murmurs. S1 and S2 normal. Radial pulses normal bilaterally. No jugular venous distention.  Respiratory: clear to auscultation bilaterally GI : normal bowel sounds, soft and nontender. No distention.   MSK: extremities warm, well perfused. No edema.  NEURO: grossly nonfocal exam, moves all extremities. PSYCH: alert and oriented x 3, normal mood and affect.   ASSESSMENT:    1. Essential hypertension   2. DOE (dyspnea on exertion)   3. Hyperlipidemia, unspecified hyperlipidemia type   4. Sleep apnea, unspecified type   5. Chest pain, moderate coronary artery risk   6. Morbid obesity ([place])   7. BMI 50.0-59.9, adult [facility])    PLAN:    Chest pain-patient has not had recurrent episodes of chest pain that were concerning.  We will continue to monitor this.  If he has increasing or worsening episodes of chest discomfort with activity, I have asked him to contact our office for consideration for further ischemic testing given mild defect which may be artifact on stress testing.  Dyspnea on exertion-echocardiogram was unremarkable for source of dyspnea.  He gets this sensation with exertion and walking.  It may be multifactorial given that he has reactive airway disease and deconditioning with morbid obesity.  We have discussed exercise recommendations today and he is very motivated in the contemplative phase for lifestyle change.  Hypertension-continue valsartan.  He feels that the office blood pressure is elevated beyond his normal.  We will recheck it  at our next visit.  If still elevated would recommend using HCTZ daily or adding alternate therapy.  Hyperlipidemia-continue to encourage lifestyle modification for improvement of LDL, last lipid panel in 2019, need to repeat at next visit.  Sleep apnea-encourage compliance with CPAP  Obesity-exercise recommendations provided.   Total time of encounter: 30 minutes total time of encounter, including 20 minutes spent in face-to-face patient care. This time includes coordination of care and counseling regarding above mentioned problem list. Remainder of non-face-to-face time involved reviewing chart documents/testing relevant to the patient encounter and documentation in the medical record. I have independently reviewed documentation from referring provider.   Cherlynn Kaiser, MD [place]  [facility]    Medication Adjustments/Labs and Tests Ordered: Current medicines are reviewed at length with the patient today.  Concerns regarding medicines are outlined above.  Orders Placed This Encounter  Procedures  . EKG 12-Lead   No orders of the defined types were placed in this encounter.   Patient Instructions  Medication Instructions:  No changes *If you need a refill on your cardiac medications before your next appointment, please call your pharmacy*  Lab Work: Not needed  Testing/Procedures: Not needed   Follow-Up: At [facility], you and your health needs are our priority.  As part of our continuing mission to provide you with exceptional heart care, we have created designated Provider Care Teams.  These Care Teams include your primary Cardiologist (physician) and Advanced Practice Providers (APPs -  Physician Assistants and Nurse Practitioners) who all work together to provide you with the care you need, when you need it.  Your next appointment:   3 month(s)  The format for your next appointment:   Either In Person or Virtual  Provider:   Cherlynn Kaiser,  MD  Other Instructions n/a

## 2019-03-05 IMAGING — DX DG CHEST 2V
2 series · 2 of 2 positions shown · non-contrast
Comparison: 07/31/2017

CLINICAL DATA: Short of breath

EXAM:
CHEST - 2 VIEW

[chest pa]
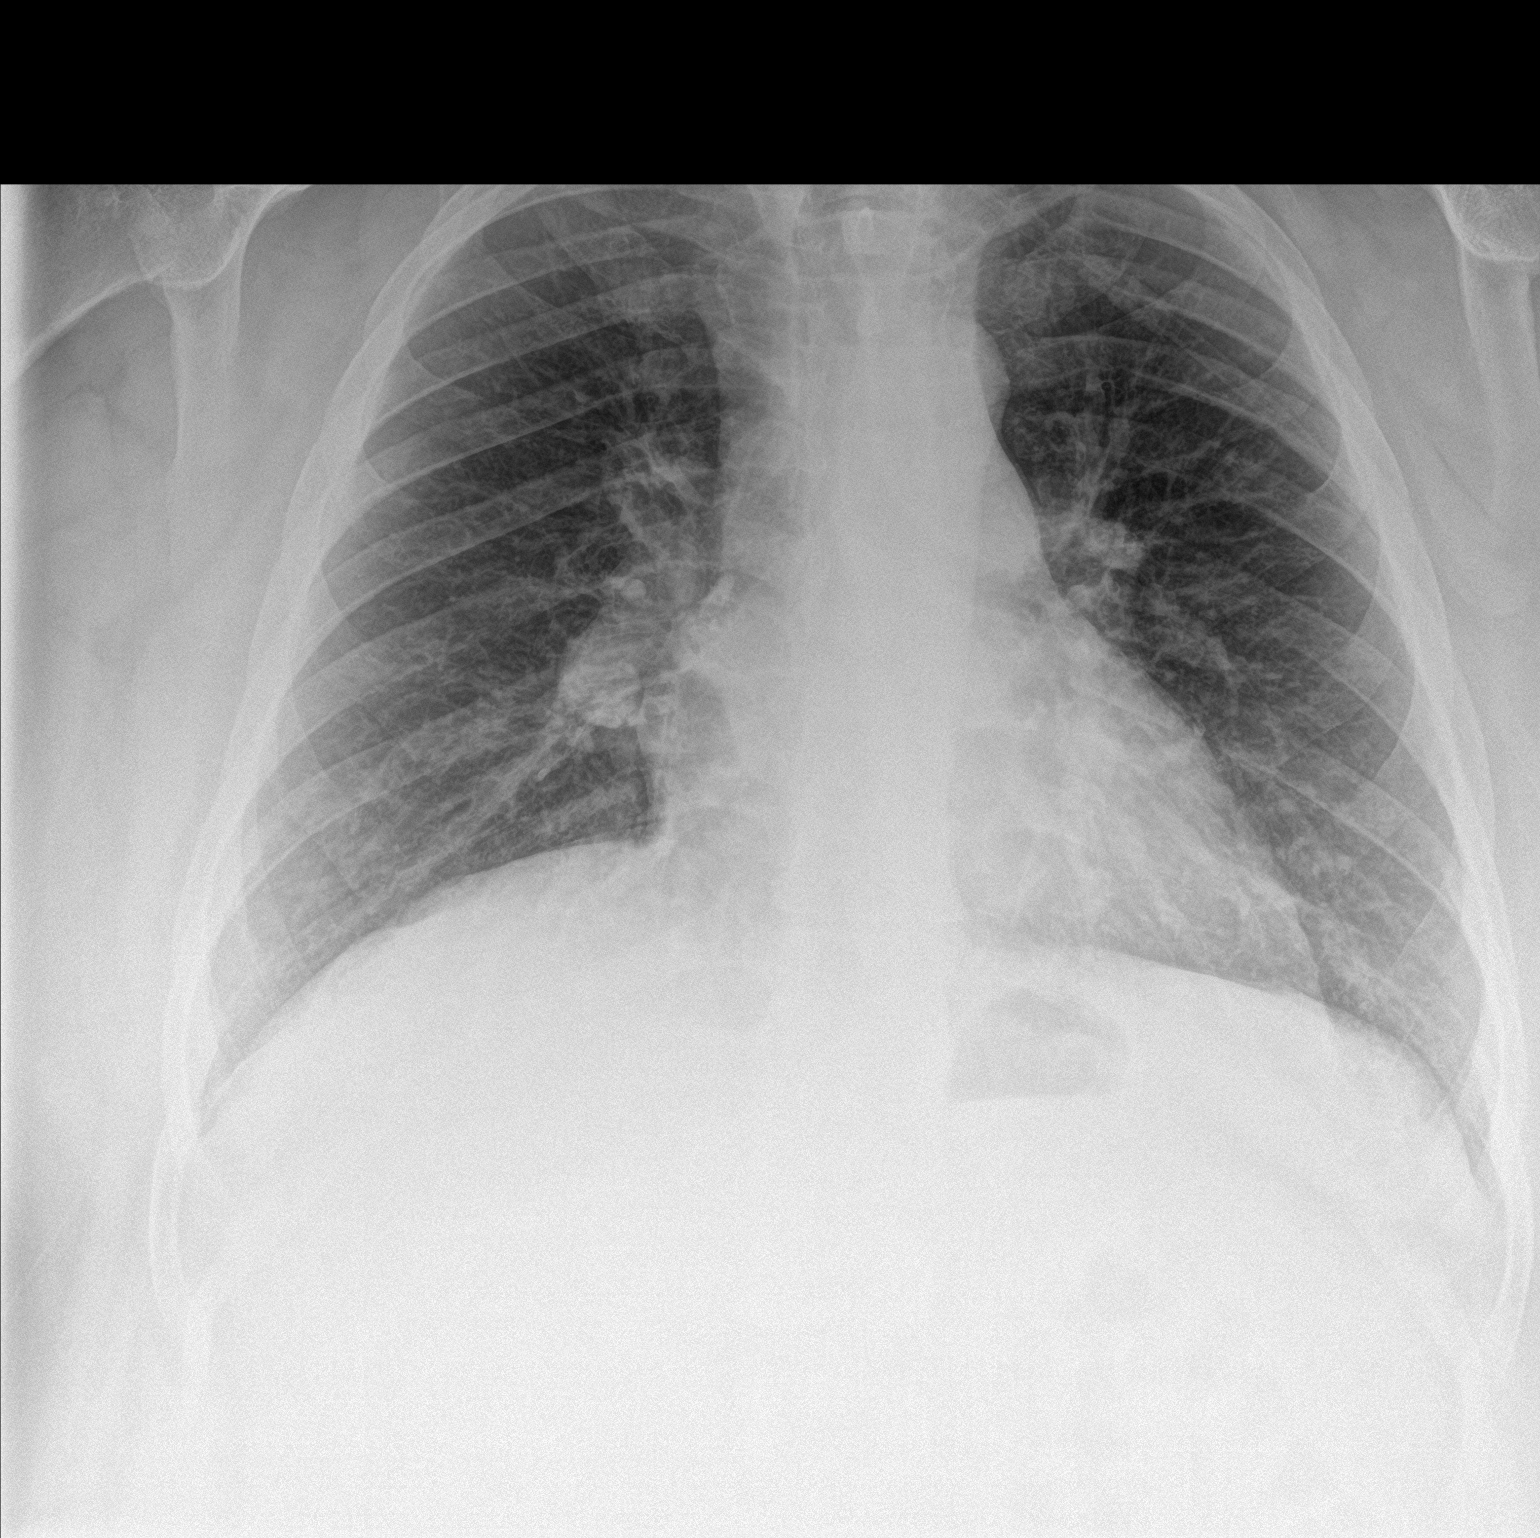

[chest lat]
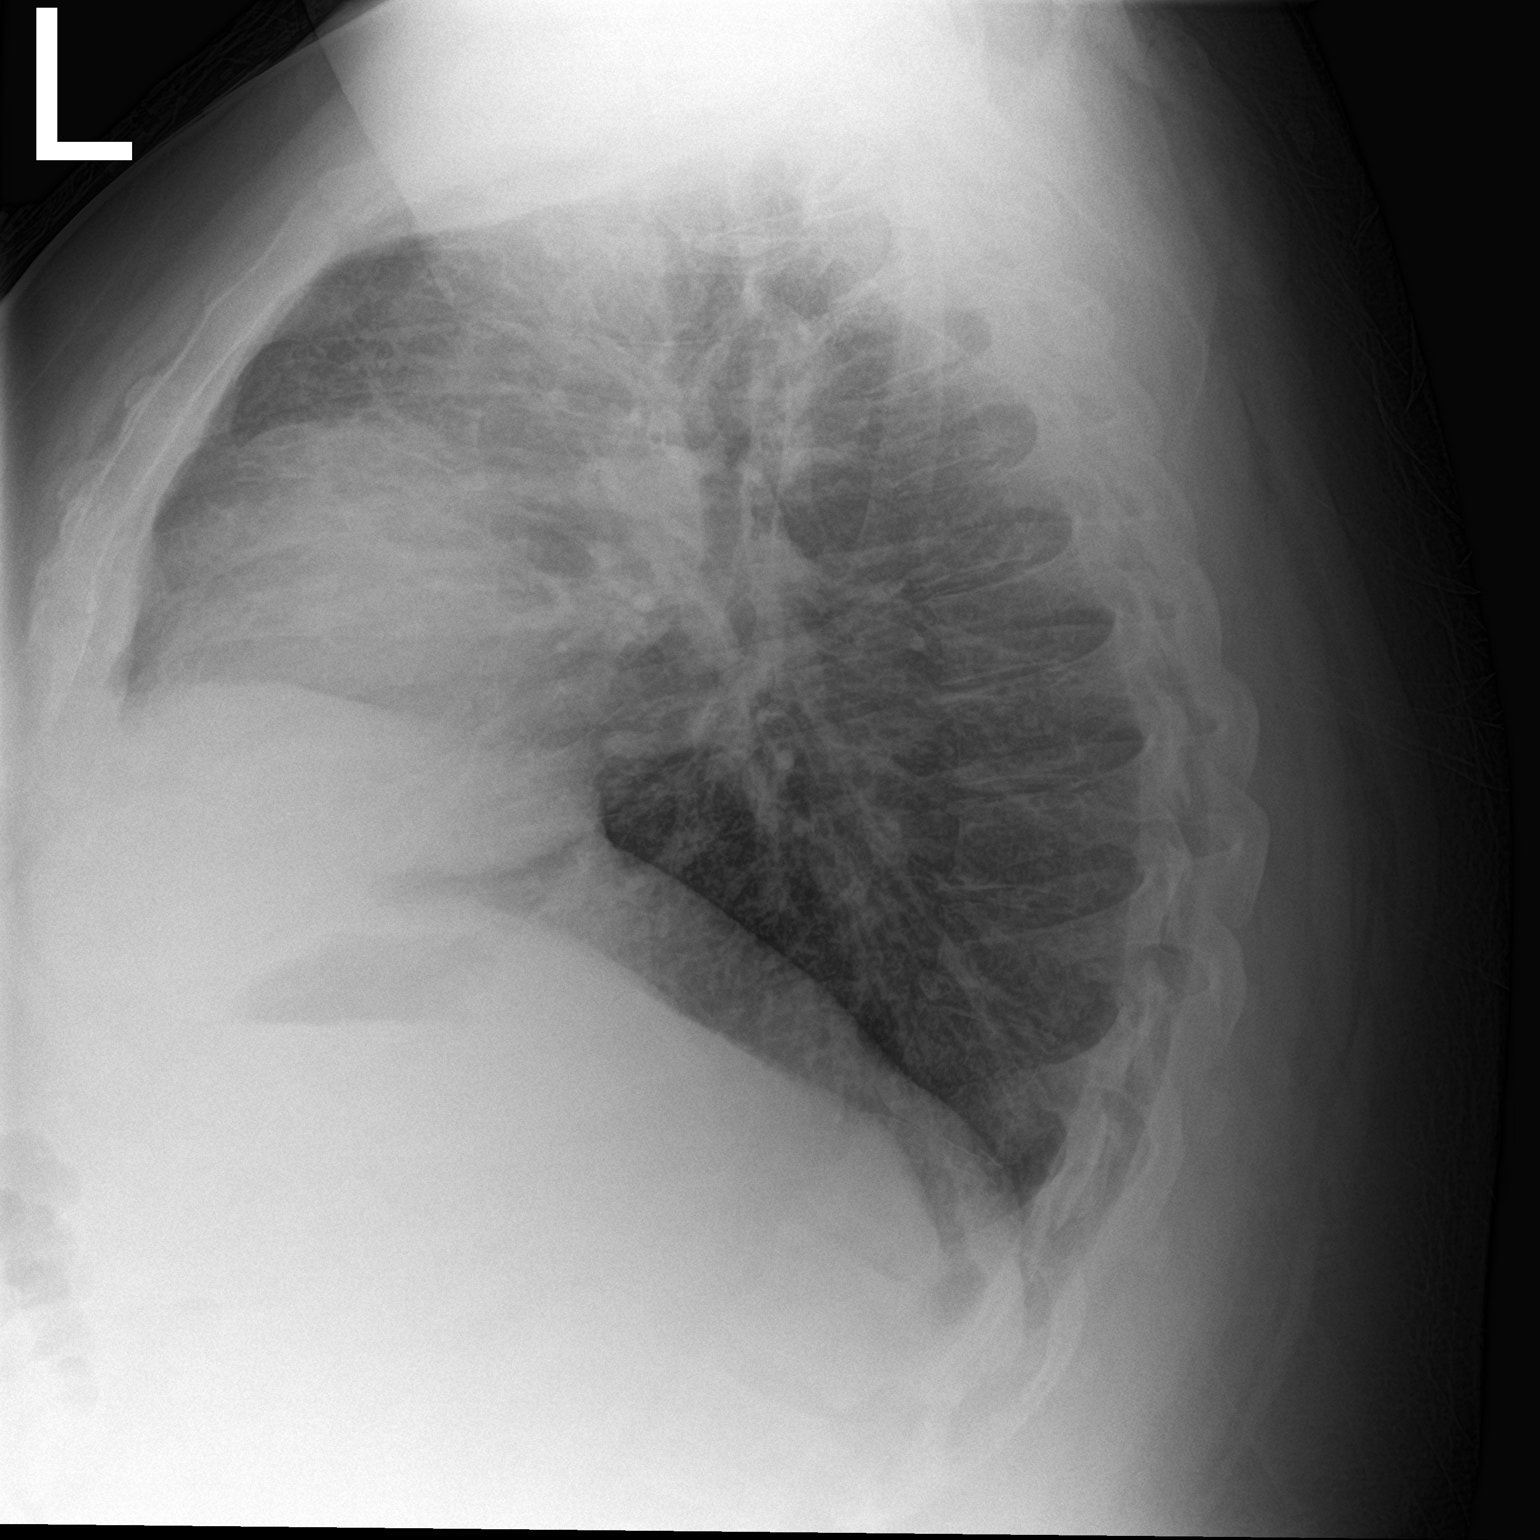

[2 of 2 positions shown; findings below may reference images not displayed]

FINDINGS: The heart size and mediastinal contours are within normal limits.
Both lungs are clear. The visualized skeletal structures are
unremarkable.
IMPRESSION: No active cardiopulmonary disease.

## 2019-03-07 IMAGING — DX DG CHEST 2V
2 series · 2 of 2 positions shown · non-contrast
Comparison: 08/09/2017

CLINICAL DATA: Shortness of breath.  Hypoxia.  Pneumonia.

EXAM:
CHEST - 2 VIEW

[chest pa]
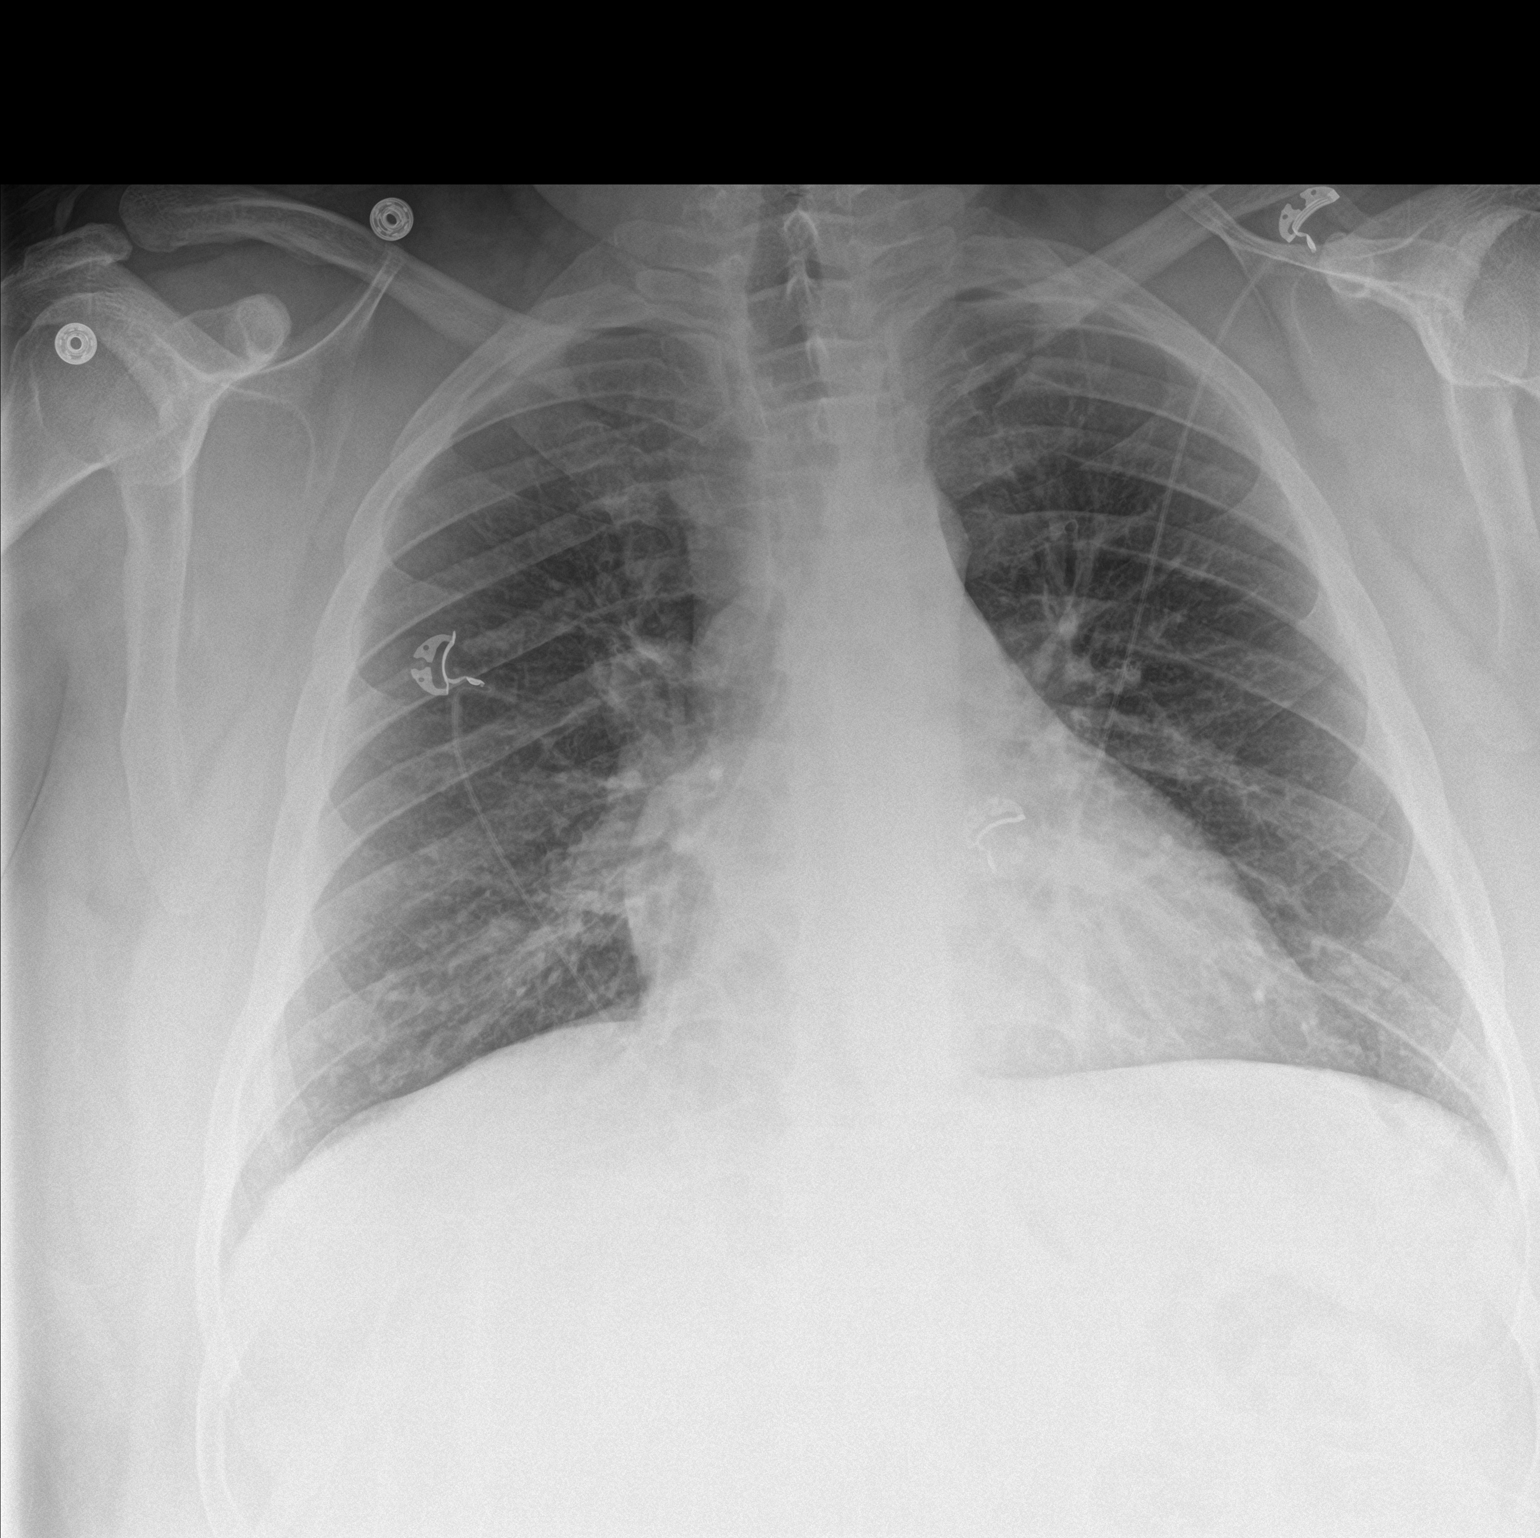

[chest lat]
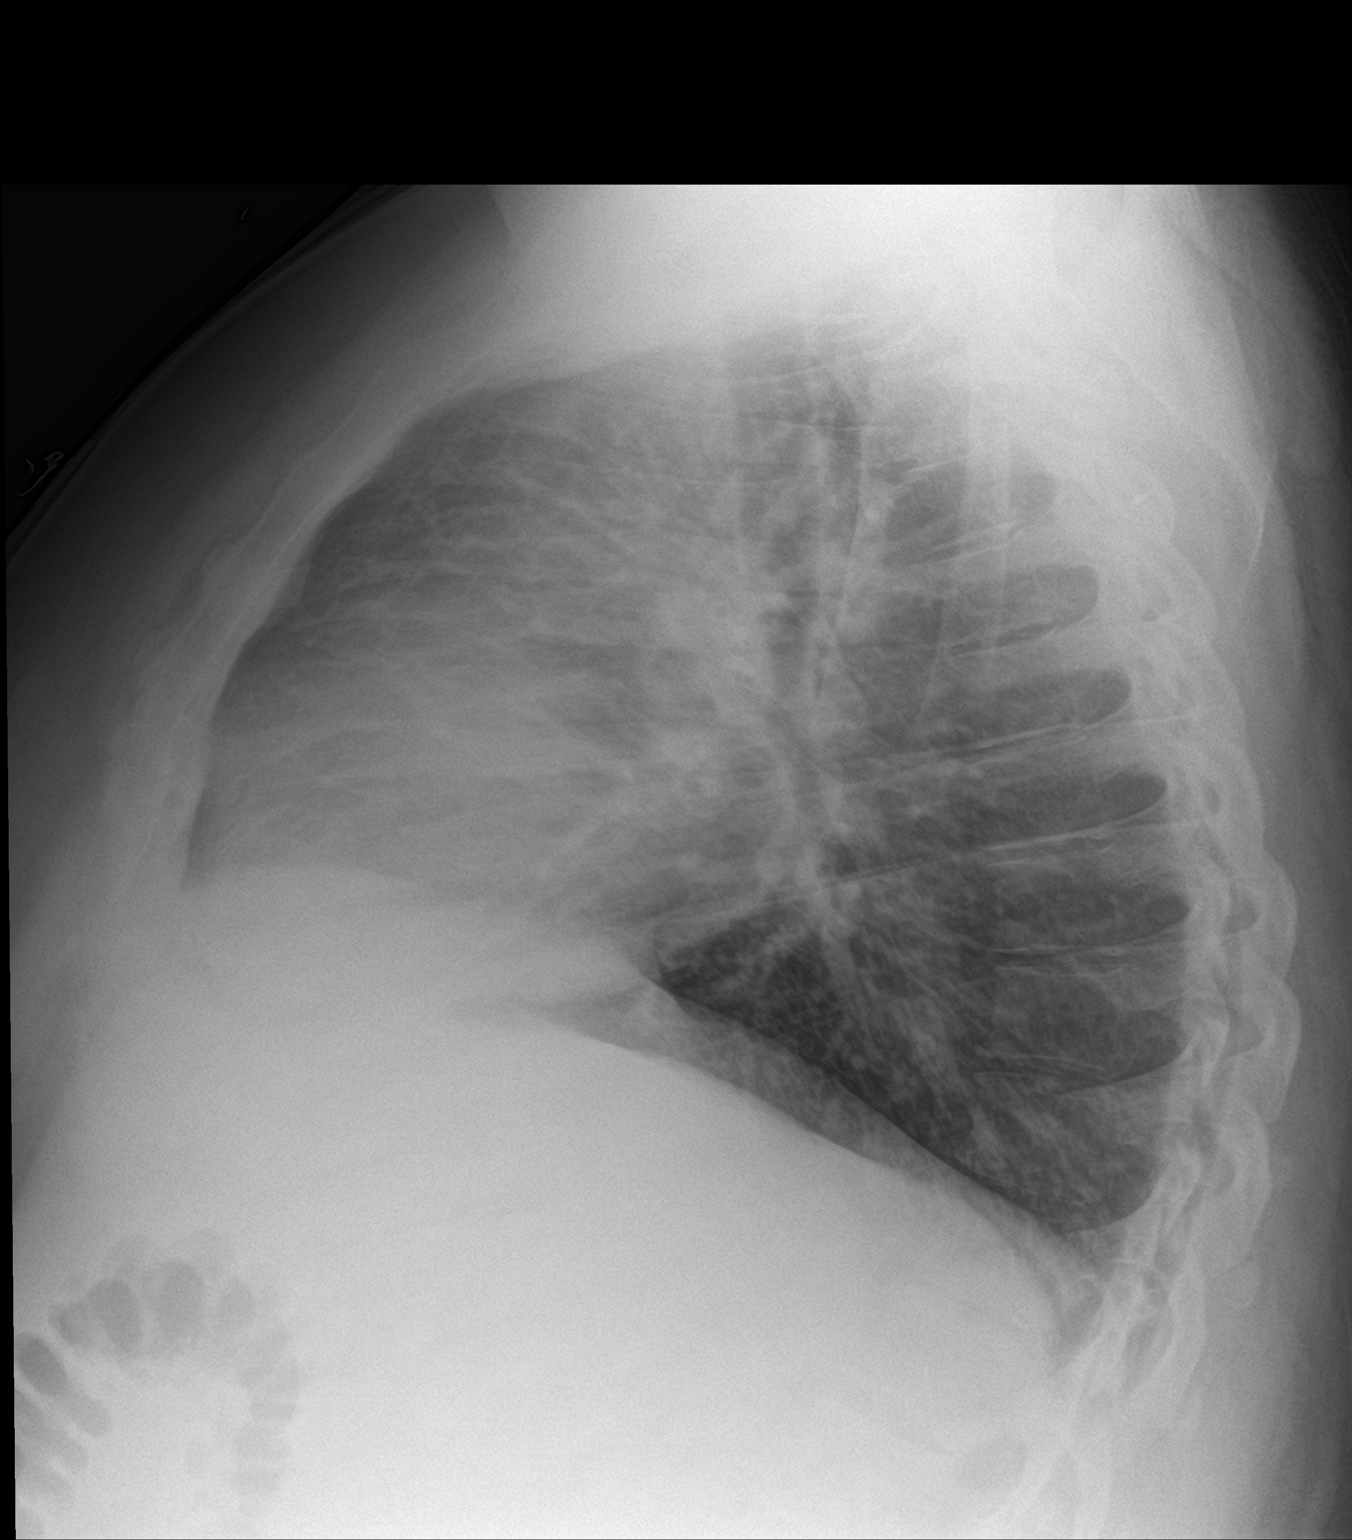

[2 of 2 positions shown; findings below may reference images not displayed]

FINDINGS: The heart size and mediastinal contours are within normal limits.
Both lungs are clear. The visualized skeletal structures are
unremarkable.
IMPRESSION: Stable exam.  No active cardiopulmonary disease.

## 2019-03-28 ENCOUNTER — Ambulatory Visit: Payer: BC Managed Care – PPO

## 2019-03-30 ENCOUNTER — Ambulatory Visit: Payer: BC Managed Care – PPO | Attending: Internal Medicine

## 2019-03-30 DIAGNOSIS — Z23 Encounter for immunization: Secondary | ICD-10-CM

## 2019-03-30 NOTE — Progress Notes (Signed)
   Covid-19 Vaccination Clinic  Name:  Aaron Frost    MRN: [id number] DOB: 1980/10/27  03/30/2019  [name] was observed post Covid-19 immunization for 15 minutes without incident. He was provided with Vaccine Information Sheet and instruction to access the V-Safe system.   [name] was instructed to call 911 with any severe reactions post vaccine: Marland Kitchen Difficulty breathing  . Swelling of face and throat  . A fast heartbeat  . A bad rash all over body  . Dizziness and weakness   Immunizations Administered    Name Date Dose VIS Date Route   Pfizer COVID-19 Vaccine 03/30/2019  8:16 AM 0.3 mL 12/19/2018 Intramuscular   Manufacturer: [place]   Lot: [id number]   [place]: [id number]

## 2019-04-21 ENCOUNTER — Ambulatory Visit: Payer: BC Managed Care – PPO | Attending: Internal Medicine

## 2019-04-21 DIAGNOSIS — Z23 Encounter for immunization: Secondary | ICD-10-CM

## 2019-04-21 NOTE — Progress Notes (Signed)
   Covid-19 Vaccination Clinic  Name:  Aaron Frost    MRN: [id number] DOB: Sep 23, 1980  04/21/2019  Aaron Frost was observed post Covid-19 immunization for 15 minutes without incident. He was provided with Vaccine Information Sheet and instruction to access the V-Safe system.   Aaron Frost was instructed to call 911 with any severe reactions post vaccine: Marland Kitchen Difficulty breathing  . Swelling of face and throat  . A fast heartbeat  . A bad rash all over body  . Dizziness and weakness   Immunizations Administered    Name Date Dose VIS Date Route   Pfizer COVID-19 Vaccine 04/21/2019  2:33 PM 0.3 mL 12/19/2018 Intramuscular   Manufacturer: [place]   Lot: [id number]   [place]: [id number]

## 2019-05-25 ENCOUNTER — Telehealth: Payer: BC Managed Care – PPO | Admitting: Internal Medicine

## 2019-05-28 ENCOUNTER — Telehealth (INDEPENDENT_AMBULATORY_CARE_PROVIDER_SITE_OTHER): Payer: BC Managed Care – PPO | Admitting: Internal Medicine

## 2019-05-28 ENCOUNTER — Telehealth: Payer: Self-pay | Admitting: *Deleted

## 2019-05-28 ENCOUNTER — Encounter: Payer: Self-pay | Admitting: Internal Medicine

## 2019-05-28 DIAGNOSIS — Z6841 Body Mass Index (BMI) 40.0 and over, adult: Secondary | ICD-10-CM

## 2019-05-28 DIAGNOSIS — I10 Essential (primary) hypertension: Secondary | ICD-10-CM

## 2019-05-28 DIAGNOSIS — R0609 Other forms of dyspnea: Secondary | ICD-10-CM

## 2019-05-28 DIAGNOSIS — G4730 Sleep apnea, unspecified: Secondary | ICD-10-CM

## 2019-05-28 DIAGNOSIS — R0600 Dyspnea, unspecified: Secondary | ICD-10-CM

## 2019-05-28 DIAGNOSIS — E785 Hyperlipidemia, unspecified: Secondary | ICD-10-CM

## 2019-05-28 DIAGNOSIS — R079 Chest pain, unspecified: Secondary | ICD-10-CM

## 2019-05-28 NOTE — Patient Instructions (Addendum)
Medication Instructions:  No changes *If you need a refill on your cardiac medications before your next appointment, please call your pharmacy*   Lab Work: None ordered If you have labs (blood work) drawn today and your tests are completely normal, you will receive your results only by: Marland Kitchen MyChart Message (if you have MyChart) OR . A paper copy in the mail If you have any lab test that is abnormal or we need to change your treatment, we will call you to review the results.   Testing/Procedures: None ordered   Follow-Up: At [facility], you and your health needs are our priority.  As part of our continuing mission to provide you with exceptional heart care, we have created designated Provider Care Teams.  These Care Teams include your primary Cardiologist (physician) and Advanced Practice Providers (APPs -  Physician Assistants and Nurse Practitioners) who all work together to provide you with the care you need, when you need it.  We recommend signing up for the patient portal called "MyChart".  Sign up information is provided on this After Visit Summary.  MyChart is used to connect with patients for Virtual Visits (Telemedicine).  Patients are able to view lab/test results, encounter notes, upcoming appointments, etc.  Non-urgent messages can be sent to your provider as well.   To learn more about what you can do with MyChart, go to [email or web address].    Your next appointment:   3 month(s)  The format for your next appointment:   In Person  Provider:   You may see Elouise Munroe, MD or one of the following Advanced Practice Providers on your designated Care Team:    Rosaria Ferries, PA-C  Jory Sims, DNP, ANP  Cadence Kathlen Mody, NP   Other Instructions A referral has been placed to the [facility] and [place].

## 2019-05-28 NOTE — Progress Notes (Signed)
Virtual Visit via Telephone Note   This visit type was conducted due to national recommendations for restrictions regarding the COVID-19 Pandemic (e.g. social distancing) in an effort to limit this patient's exposure and mitigate transmission in our community.  Due to his co-morbid illnesses, this patient is at least at moderate risk for complications without adequate follow up.  This format is felt to be most appropriate for this patient at this time.  The patient did not have access to video technology/had technical difficulties with video requiring transitioning to audio format only (telephone).  All issues noted in this document were discussed and addressed.  No physical exam could be performed with this format.  Please refer to the patient's chart for his  consent to telehealth for [facility].   The patient was identified using 2 identifiers.  Date:  05/28/2019   ID:  Aaron Frost, DOB August 15, 1980, MRN [id number]  Patient Location: Home Provider Location: Office  PCP:  Inda Coke, [place]  Cardiologist:  Elouise Munroe, MD  Electrophysiologist:  None   Evaluation Performed:  Follow-Up Visit  Chief Complaint:  F/u cp, sob, htn, hld  History of Present Illness:    Aaron Frost is a 39 y.o. male with OSA on CPAP, HTN, asthma, depression, morbid obesity, and reflux who presents today for follow up evaluation of chest pain.   No significant CP or SOB, since his last visit. He is mostly concerned about weight and weight management. We discussed weight management strategies in detail. He is also bothered by an umbilical hernia but states operative management is not an option until he has further weight loss.  His sister is anticipating twins and is doing well.   The patient does not have symptoms concerning for COVID-19 infection (fever, chills, cough, or new shortness of breath).    Past Medical History:  Diagnosis Date  . Asthma 08/09/2017  . OSA on CPAP   .  Umbilical hernia [id number]   Past Surgical History:  Procedure Laterality Date  . EXTERNAL EAR SURGERY     right ear reconstruction  . MOLE REMOVAL     Baco of Neck  . TONSILLECTOMY  2005     Current Meds  Medication Sig  . acetaminophen (TYLENOL) 500 MG tablet Take 500 mg by mouth every 6 (six) hours as needed for mild pain.   Marland Kitchen albuterol (PROVENTIL) (2.5 MG/3ML) 0.083% nebulizer solution Take 1 vial by nebulization every 4-6 hours as needed for wheezing  . albuterol (VENTOLIN HFA) 108 (90 Base) MCG/ACT inhaler Inhale 2 puffs every 4-6 hours as needed for wheezing  . fluticasone (FLONASE) 50 MCG/ACT nasal spray Place 2 sprays into both nostrils daily. (Patient taking differently: Place 2 sprays into both nostrils daily as needed. )  . metFORMIN (GLUCOPHAGE XR) 500 MG 24 hr tablet Start 500mg  PO qpm.  . montelukast (SINGULAIR) 10 MG tablet Take 1 tablet (10 mg total) by mouth at bedtime. (Patient taking differently: Take 10 mg by mouth daily as needed. )  . Multiple Vitamin (MULTIVITAMIN WITH MINERALS) TABS tablet Take 1 tablet by mouth daily.  Marland Kitchen omeprazole (PRILOSEC OTC) 20 MG tablet Take 20 mg by mouth daily as needed.   . valsartan (DIOVAN) 40 MG tablet Take 1 tablet (40 mg total) by mouth daily.  . vitamin B-12 (CYANOCOBALAMIN) 1000 MCG tablet Take 1,000 mcg by mouth daily.  . Zinc 30 MG CAPS Take 30 mg by mouth.     Allergies:   Aleve [naproxen] and Nickel  Social History   Tobacco Use  . Smoking status: Former Smoker    Packs/day: 1.50    Years: 3.00    Pack years: 4.50    Types: Cigarettes    Quit date: 01/09/2004    Years since quitting: 15.3  . Smokeless tobacco: Never Used  Substance Use Topics  . Alcohol use: Yes    Comment: very rare glass of wine  . Drug use: No     Family Hx: The patient's family history includes Cancer in his sister; Cancer (age of onset: 60) in his paternal grandmother; Colon cancer (age of onset: 30) in his paternal grandfather; Diabetes in  his maternal grandfather; Heart disease in his father and paternal grandfather; Hypertension in his mother; Stroke in his paternal grandfather. There is no history of Breast cancer or Prostate cancer.  ROS:   Please see the history of present illness.     All other systems reviewed and are negative.   Prior CV studies:   The following studies were reviewed today:    Labs/Other Tests and Data Reviewed:    EKG:  No ECG reviewed.  Recent Labs: No results found for requested labs within last 8760 hours.   Recent Lipid Panel Lab Results  Component Value Date/Time   CHOL 173 12/26/2017 09:06 AM   TRIG 86.0 12/26/2017 09:06 AM   HDL 42.50 12/26/2017 09:06 AM   CHOLHDL 4 12/26/2017 09:06 AM   LDLCALC 114 (H) 12/26/2017 09:06 AM    Wt Readings from Last 3 Encounters:  05/28/19 (!) 451 lb (204.6 kg)  02/16/19 (!) 451 lb 12.8 oz (204.9 kg)  10/08/18 (!) 428 lb 9.6 oz (194.4 kg)     Objective:    Vital Signs:  Ht 6\' 1"  (1.854 m)   Wt (!) 451 lb (204.6 kg)   BMI 59.50 kg/m    VITAL SIGNS:  reviewed GEN:  no acute distress RESPIRATORY:  normal respiratory effort, no increased work of breathing NEURO:  alert and oriented x 3, speech normal PSYCH:  normal affect   ASSESSMENT & PLAN:    1. Morbid obesity ([place])   2. BMI 50.0-59.9, adult ([place])   3. Essential hypertension   4. DOE (dyspnea on exertion)   5. Hyperlipidemia, unspecified hyperlipidemia type   6. Sleep apnea, unspecified type   7. Chest pain, moderate coronary artery risk    Chest pain- no recurrence, counseled on red flag symptoms.   Dyspnea on exertion-improved, and pursing weight loss.    Hypertension-continue valsartan.   Hyperlipidemia-he is pursuing lifestyle modification. Referral to healthy weight [facility] wellness center.    Sleep apnea-encourage compliance with CPAP   Obesity-we will make a referral to healthy weight and wellness center, he is optimistic this well help. I shared that I have had  several patients with success in this program and it is an excellent strategy.    COVID-19 Education: The signs and symptoms of COVID-19 were discussed with the patient and how to seek care for testing (follow up with PCP or arrange E-visit).  The importance of social distancing was discussed today.  Time:   Today, I have spent 20 minutes with the patient with telehealth technology discussing the above problems.     Medication Adjustments/Labs and Tests Ordered: Current medicines are reviewed at length with the patient today.  Concerns regarding medicines are outlined above.   Tests Ordered: No orders of the defined types were placed in this encounter.   Medication Changes: No orders of the  defined types were placed in this encounter.   Follow Up:  3 mo  Signed, Elouise Munroe, MD  05/28/2019 9:25 AM    [place]

## 2019-05-28 NOTE — Telephone Encounter (Signed)
  Patient Consent for Virtual Visit         Aaron Frost has provided verbal consent on 05/28/2019 for a virtual visit (video or telephone).   CONSENT FOR VIRTUAL VISIT FOR:  Aaron Frost  By participating in this virtual visit I agree to the following:  I hereby voluntarily request, consent and authorize [facility] and its employed or contracted physicians, physician assistants, nurse practitioners or other licensed health care professionals (the Practitioner), to provide me with telemedicine health care services (the "Services") as deemed necessary by the treating Practitioner. I acknowledge and consent to receive the Services by the Practitioner via telemedicine. I understand that the telemedicine visit will involve communicating with the Practitioner through live audiovisual communication technology and the disclosure of certain medical information by electronic transmission. I acknowledge that I have been given the opportunity to request an in-person assessment or other available alternative prior to the telemedicine visit and am voluntarily participating in the telemedicine visit.  I understand that I have the right to withhold or withdraw my consent to the use of telemedicine in the course of my care at any time, without affecting my right to future care or treatment, and that the Practitioner or I may terminate the telemedicine visit at any time. I understand that I have the right to inspect all information obtained and/or recorded in the course of the telemedicine visit and may receive copies of available information for a reasonable fee.  I understand that some of the potential risks of receiving the Services via telemedicine include:  Marland Kitchen Delay or interruption in medical evaluation due to technological equipment failure or disruption; . Information transmitted may not be sufficient (e.g. poor resolution of images) to allow for appropriate medical decision making by the  Practitioner; and/or  . In rare instances, security protocols could fail, causing a breach of personal health information.  Furthermore, I acknowledge that it is my responsibility to provide information about my medical history, conditions and care that is complete and accurate to the best of my ability. I acknowledge that Practitioner's advice, recommendations, and/or decision may be based on factors not within their control, such as incomplete or inaccurate data provided by me or distortions of diagnostic images or specimens that may result from electronic transmissions. I understand that the practice of medicine is not an exact science and that Practitioner makes no warranties or guarantees regarding treatment outcomes. I acknowledge that a copy of this consent can be made available to me via my patient portal ([facility]), or I can request a printed copy by calling the office of [facility].    I understand that my insurance will be billed for this visit.   I have read or had this consent read to me. . I understand the contents of this consent, which adequately explains the benefits and risks of the Services being provided via telemedicine.  . I have been provided ample opportunity to ask questions regarding this consent and the Services and have had my questions answered to my satisfaction. . I give my informed consent for the services to be provided through the use of telemedicine in my medical care

## 2019-06-19 ENCOUNTER — Telehealth: Payer: Self-pay | Admitting: Pulmonary Disease

## 2019-06-19 MED ORDER — DOXYCYCLINE HYCLATE 100 MG PO TABS: 100.0000 mg | ORAL_TABLET | Freq: Two times a day (BID) | ORAL | 0 refills | Status: DC

## 2019-06-19 NOTE — Telephone Encounter (Signed)
Spoke with the pt and notified of response per [facility] and he verbalized understanding  Nothing further needed

## 2019-06-19 NOTE — Telephone Encounter (Signed)
Called patient. He stated that he has had a sinus infection for the past 3-4 weeks. He has been using OTC Sudafed but it is not working. He does not have a runny nose, but is suffering from post nasal drip. His throat feels raw and irritated. He is starting to lose his voice. Denied any fevers or body aches. He is UTD on his covid vaccines.   [facility] [place] [facility] [address], can you please advise AO is unavailable today? Thanks!

## 2019-06-19 NOTE — Telephone Encounter (Signed)
Sent in RX doxycyline- take 1 tab twice daily x 7 days. Recommend mucinex twice daily and saline nasal rinse twice daily.

## 2019-07-06 ENCOUNTER — Encounter (INDEPENDENT_AMBULATORY_CARE_PROVIDER_SITE_OTHER): Payer: Self-pay

## 2019-07-23 ENCOUNTER — Telehealth: Payer: Self-pay | Admitting: Internal Medicine

## 2019-07-23 NOTE — Telephone Encounter (Signed)
LVM for patient to return call to get f/u scheduled with Margaretann Loveless from recall lists

## 2019-10-08 ENCOUNTER — Encounter: Payer: Self-pay | Admitting: General Practice

## 2019-12-25 ENCOUNTER — Encounter: Payer: Self-pay | Admitting: Physician Assistant

## 2019-12-25 ENCOUNTER — Other Ambulatory Visit: Payer: Self-pay | Admitting: *Deleted

## 2019-12-25 ENCOUNTER — Other Ambulatory Visit: Payer: Self-pay

## 2019-12-25 ENCOUNTER — Ambulatory Visit (INDEPENDENT_AMBULATORY_CARE_PROVIDER_SITE_OTHER): Payer: BC Managed Care – PPO | Admitting: Physician Assistant

## 2019-12-25 VITALS — BP 134/84 | HR 84 | Temp 97.4°F | Ht 73.0 in | Wt >= 6400 oz

## 2019-12-25 DIAGNOSIS — Z9989 Dependence on other enabling machines and devices: Secondary | ICD-10-CM

## 2019-12-25 DIAGNOSIS — Z842 Family history of other diseases of the genitourinary system: Secondary | ICD-10-CM

## 2019-12-25 DIAGNOSIS — Z0001 Encounter for general adult medical examination with abnormal findings: Secondary | ICD-10-CM

## 2019-12-25 DIAGNOSIS — E669 Obesity, unspecified: Secondary | ICD-10-CM

## 2019-12-25 DIAGNOSIS — K429 Umbilical hernia without obstruction or gangrene: Secondary | ICD-10-CM

## 2019-12-25 DIAGNOSIS — L989 Disorder of the skin and subcutaneous tissue, unspecified: Secondary | ICD-10-CM

## 2019-12-25 DIAGNOSIS — G4733 Obstructive sleep apnea (adult) (pediatric): Secondary | ICD-10-CM

## 2019-12-25 NOTE — Progress Notes (Signed)
Subjective:    Aaron Frost is a 39 y.o. male and is here for a comprehensive physical exam.  HPI  Health Maintenance Due  Topic Date Due  . Hepatitis C Screening  Never done   Acute Concerns: Skin rash -- having some new itchy plaques on the back of his scalp. He tends to pick at these. He has concerns because it is in areas where he has had skin bx in the past. Denies oozing of purulent drainage.  Chronic Issues: OSA -- wears CPAP regularly; needs updated sleep study Family history of prostate issues -- father currently ongoing bx for enlarged prostate; patient reports that he has frequent urination at night but this is close to his baseline. He denies: difficulty urinating/controlling bladder, weak stream. Umbilical hernia -- would like evaluated. Getting larger with time. Gets fleeting pain but nothing significant.  Health Maintenance: Immunizations -- UTD Colonoscopy -- n/a PSA -- n/a Diet -- variable; reports that he is doing keto currently Sleep habits -- good as long as using CPAP Exercise -- limited due to weight and COVID Weight -- Weight: (!) 446 lb (202.3 kg)  Weight history Wt Readings from Last 10 Encounters:  12/25/19 (!) 446 lb (202.3 kg)  05/28/19 (!) 451 lb (204.6 kg)  02/16/19 (!) 451 lb 12.8 oz (204.9 kg)  10/08/18 (!) 428 lb 9.6 oz (194.4 kg)  07/21/18 (!) 407 lb (184.6 kg)  07/04/18 (!) 407 lb (184.6 kg)  03/13/18 (!) 431 lb (195.5 kg)  03/11/18 (!) 432 lb (196 kg)  02/13/18 (!) 433 lb 4 oz (196.5 kg)  12/26/17 (!) 425 lb 4 oz (192.9 kg)   Body mass index is 58.84 kg/m. Mood -- well controlled; no significant concerns right now. Tobacco use --  Tobacco Use: Medium Risk  . Smoking Tobacco Use: Former Smoker  . Smokeless Tobacco Use: Never Used    Alcohol use ---  reports current alcohol use.   Depression screen PHQ 2/9 10/09/2018  Decreased Interest 0  Down, Depressed, Hopeless 0  PHQ - 2 Score 0  Altered sleeping -  Tired,  decreased energy -  Change in appetite -  Feeling bad or failure about yourself  -  Trouble concentrating -  Moving slowly or fidgety/restless -  Suicidal thoughts -  PHQ-9 Score -     Other providers/specialists: Patient Care Team: Aaron Frost, [place] as PCP - General (Physician Assistant) Aaron Munroe, [place] as PCP - Cardiology (Cardiology)   PMHx, SurgHx, SocialHx, Medications, and Allergies were reviewed in the Visit Navigator and updated as appropriate.   Past Medical History:  Diagnosis Date  . Asthma 08/09/2017  . OSA on CPAP   . Umbilical hernia 4818     Past Surgical History:  Procedure Laterality Date  . EXTERNAL EAR SURGERY     right ear reconstruction  . MOLE REMOVAL     [place] of Neck  . TONSILLECTOMY  2005     Family History  Problem Relation Age of Onset  . Colon cancer Paternal Grandfather 31  . Heart disease Paternal Grandfather   . Stroke Paternal Grandfather   . Heart disease Father   . Hypertension Mother        grandfather/uncles  . Diabetes Maternal Grandfather   . Cancer Sister        male  . Cancer Paternal Grandmother 109       stomach  . Breast cancer Neg Hx   . Prostate cancer Neg Hx  Social History   Tobacco Use  . Smoking status: Former Smoker    Packs/day: 1.50    Years: 3.00    Pack years: 4.50    Types: Cigarettes    Quit date: 01/09/2004    Years since quitting: 15.9  . Smokeless tobacco: Never Used  Substance Use Topics  . Alcohol use: Yes    Comment: very rare glass of wine  . Drug use: No    Review of Systems:   Review of Systems  Constitutional: Negative for chills, fever, malaise/fatigue and weight loss.  HENT: Negative for hearing loss, sinus pain and sore throat.   Respiratory: Negative for cough and hemoptysis.   Cardiovascular: Negative for chest pain, palpitations, leg swelling and PND.  Gastrointestinal: Negative for abdominal pain, constipation, diarrhea, heartburn, nausea and vomiting.   Genitourinary: Negative for dysuria, frequency and urgency.  Musculoskeletal: Negative for back pain, myalgias and neck pain.  Skin: Positive for rash. Negative for itching.  Neurological: Negative for dizziness, tingling, seizures and headaches.  Endo/Heme/Allergies: Negative for polydipsia.  Psychiatric/Behavioral: Negative for depression. The patient is not nervous/anxious.     Objective:   Vitals:   12/25/19 1357  BP: 134/84  Pulse: 84  Temp: (!) 97.4 F (36.3 C)  SpO2: 99%   Body mass index is 58.84 kg/m.  General Appearance:  Alert, cooperative, no distress, appears stated age  Head:  Normocephalic, without obvious abnormality, atraumatic  Eyes:  PERRL, conjunctiva/corneas clear, EOM's intact, fundi benign, both eyes       Ears:  Normal TM's and external ear canals, both ears  Nose: Nares normal, septum midline, mucosa normal, no drainage    or sinus tenderness  Throat: Lips, mucosa, and tongue normal; teeth and gums normal  Neck: Supple, symmetrical, trachea midline, no adenopathy; thyroid:  No enlargement/tenderness/nodules; no carotit bruit or JVD  Back:   Symmetric, no curvature, ROM normal, no CVA tenderness  Lungs:   Clear to auscultation bilaterally, respirations unlabored  Chest wall:  No tenderness or deformity  Heart:  Regular rate and rhythm, S1 and S2 normal, no murmur, rub   or gallop  Abdomen:   Soft, non-tender, bowel sounds active all four quadrants, no organomegaly Softball-sized reducible mass superior to umbilicus  Extremities: Extremities normal, atraumatic, no cyanosis or edema  Prostate: Not done.   Skin: Skin color, texture, turgor normal Silvery plaques to hair line with slight scabbing  Lymph nodes: Cervical, supraclavicular, and axillary nodes normal  Neurologic: CNII-XII grossly intact. Normal strength, sensation and reflexes throughout    Assessment/Plan:   Aaron Frost was seen today for annual exam.  Diagnoses and all orders for this  visit:  Encounter for general adult medical examination with abnormal findings Today patient counseled on age appropriate routine health concerns for screening and prevention, each reviewed and up to date or declined. Immunizations reviewed and up to date or declined. Labs ordered and reviewed. Risk factors for depression reviewed and negative. Hearing function and visual acuity are intact. ADLs screened and addressed as needed. Functional ability and level of safety reviewed and appropriate. Education, counseling and referrals performed based on assessed risks today. Patient provided with a copy of personalized plan for preventive services.  OSA on CPAP Recommend reaching out to pulmonary to get updated sleep study scheduled. Continue compliance with CPAP.  Family history of prostate problems Will check PSA due to nocturia and family history. Will refer to urology if indicated. -     PSA; Future  Obesity without serious  comorbidity, unspecified classification, unspecified obesity type Wegovy sample given today.  Recommend patient reach out to [place] in 2-3 weeks to let us know how he is doing with this. Continue healthy diet and activity as tolerated. -     CBC with Differential/Platelet; Future -     Comprehensive metabolic panel; Future -     Lipid panel; Future  Umbilical hernia without obstruction and without gangrene Referral to general surgery. We reviewed red flags that would warrant ER evaluation. -     Ambulatory referral to General Surgery  Skin lesion Possible psoriasis? Will refer to dermatology. -     Ambulatory referral to Dermatology  Well Adult Exam: Labs ordered: Yes. Patient counseling was done. See below for items discussed. Discussed the patient's BMI.  The BMI is not in the acceptable range; BMI management plan is completed Follow up in 1 month.  Patient Counseling: [x]   Nutrition: Stressed importance of moderation in sodium/caffeine intake, saturated fat and  cholesterol, caloric balance, sufficient intake of fresh fruits, vegetables, and fiber.  [x]   Stressed the importance of regular exercise.   []   Substance Abuse: Discussed cessation/primary prevention of tobacco, alcohol, or other drug use; driving or other dangerous activities under the influence; availability of treatment for abuse.   [x]   Injury prevention: Discussed safety belts, safety helmets, smoke detector, smoking near bedding or upholstery.   []   Sexuality: Discussed sexually transmitted diseases, partner selection, use of condoms, avoidance of unintended pregnancy  and contraceptive alternatives.   [x]   Dental health: Discussed importance of regular tooth brushing, flossing, and dental visits.  [x]   Health maintenance and immunizations reviewed. Please refer to Health maintenance section.     Aaron Coke, PA-C [place]

## 2019-12-25 NOTE — Patient Instructions (Signed)
It was great to see you!  1. Follow-up with pulmonary to discuss updating your sleep study 2. You will be contacted about your appointment with dermatology and general surgery 3. Research [phone] and start this medication. Let me know after about 2-3 weeks if you'd like to continue this.  Please go to the lab for blood work.   Our office will call you with your results unless you have chosen to receive results via MyChart.  If your blood work is normal we will follow-up each year for physicals and as scheduled for chronic medical problems.  If anything is abnormal we will treat accordingly and get you in for a follow-up.  Take care,  [facility] Maintenance, Male Adopting a healthy lifestyle and getting preventive care are important in promoting health and wellness. Ask your health care provider about:  The right schedule for you to have regular tests and exams.  Things you can do on your own to prevent diseases and keep yourself healthy. What should I know about diet, weight, and exercise? Eat a healthy diet   Eat a diet that includes plenty of vegetables, fruits, low-fat dairy products, and lean protein.  Do not eat a lot of foods that are high in solid fats, added sugars, or sodium. Maintain a healthy weight Body mass index (BMI) is a measurement that can be used to identify possible weight problems. It estimates body fat based on height and weight. Your health care provider can help determine your BMI and help you achieve or maintain a healthy weight. Get regular exercise Get regular exercise. This is one of the most important things you can do for your health. Most adults should:  Exercise for at least 150 minutes each week. The exercise should increase your heart rate and make you sweat (moderate-intensity exercise).  Do strengthening exercises at least twice a week. This is in addition to the moderate-intensity exercise.  Spend less time sitting. Even light  physical activity can be beneficial. Watch cholesterol and blood lipids Have your blood tested for lipids and cholesterol at 40 years of age, then have this test every 5 years. You may need to have your cholesterol levels checked more often if:  Your lipid or cholesterol levels are high.  You are older than 40 years of age.  You are at high risk for heart disease. What should I know about cancer screening? Many types of cancers can be detected early and may often be prevented. Depending on your health history and family history, you may need to have cancer screening at various ages. This may include screening for:  Colorectal cancer.  Prostate cancer.  Skin cancer.  Lung cancer. What should I know about heart disease, diabetes, and high blood pressure? Blood pressure and heart disease  High blood pressure causes heart disease and increases the risk of stroke. This is more likely to develop in people who have high blood pressure readings, are of African descent, or are overweight.  Talk with your health care provider about your target blood pressure readings.  Have your blood pressure checked: ? Every 3-5 years if you are 90-71 years of age. ? Every year if you are 40 years old or older.  If you are between the ages of 78 and 64 and are a current or former smoker, ask your health care provider if you should have a one-time screening for abdominal aortic aneurysm (AAA). Diabetes Have regular diabetes screenings. This checks your fasting blood sugar  level. Have the screening done:  Once every three years after age 40 if you are at a normal weight and have a low risk for diabetes.  More often and at a younger age if you are overweight or have a high risk for diabetes. What should I know about preventing infection? Hepatitis B If you have a higher risk for hepatitis B, you should be screened for this virus. Talk with your health care provider to find out if you are at risk for  hepatitis B infection. Hepatitis C Blood testing is recommended for:  Everyone born from [birth year] through 1966.  Anyone with known risk factors for hepatitis C. Sexually transmitted infections (STIs)  You should be screened each year for STIs, including gonorrhea and chlamydia, if: ? You are sexually active and are younger than 40 years of age. ? You are older than 40 years of age and your health care provider tells you that you are at risk for this type of infection. ? Your sexual activity has changed since you were last screened, and you are at increased risk for chlamydia or gonorrhea. Ask your health care provider if you are at risk.  Ask your health care provider about whether you are at high risk for HIV. Your health care provider may recommend a prescription medicine to help prevent HIV infection. If you choose to take medicine to prevent HIV, you should first get tested for HIV. You should then be tested every 3 months for as long as you are taking the medicine. Follow these instructions at home: Lifestyle  Do not use any products that contain nicotine or tobacco, such as cigarettes, e-cigarettes, and chewing tobacco. If you need help quitting, ask your health care provider.  Do not use street drugs.  Do not share needles.  Ask your health care provider for help if you need support or information about quitting drugs. Alcohol use  Do not drink alcohol if your health care provider tells you not to drink.  If you drink alcohol: ? Limit how much you have to 0-2 drinks a day. ? Be aware of how much alcohol is in your drink. In the [place] one drink equals one 12 oz bottle of beer (355 mL), one 5 oz glass of wine (148 mL), or one 1 oz glass of hard liquor (44 mL). General instructions  Schedule regular health, dental, and eye exams.  Stay current with your vaccines.  Tell your health care provider if: ? You often feel depressed. ? You have ever been abused or do not feel safe at  home. Summary  Adopting a healthy lifestyle and getting preventive care are important in promoting health and wellness.  Follow your health care provider's instructions about healthy diet, exercising, and getting tested or screened for diseases.  Follow your health care provider's instructions on monitoring your cholesterol and blood pressure. This information is not intended to replace advice given to you by your health care provider. Make sure you discuss any questions you have with your health care provider. Document Revised: 12/18/2017 Document Reviewed: 12/18/2017 Elsevier Patient Education  2020 [employer].

## 2019-12-26 LAB — LIPID PANEL
Cholesterol: 193 mg/dL (ref <200)
HDL: 39 mg/dL — ABNORMAL LOW (ref >=40)
LDL Cholesterol (Calc): 136 mg/dL (calc) — ABNORMAL HIGH
Non-HDL Cholesterol (Calc): 154 mg/dL (calc) — ABNORMAL HIGH (ref <130)
Total CHOL/HDL Ratio: 4.9 (calc) (ref <5.0)
Triglycerides: 82 mg/dL (ref <150)

## 2019-12-26 LAB — COMPREHENSIVE METABOLIC PANEL
AG Ratio: 1.6 (calc) (ref 1.0–2.5)
ALT: 20 U/L (ref 9–46)
AST: 19 U/L (ref 10–40)
Albumin: 4 g/dL (ref 3.6–5.1)
Alkaline phosphatase (APISO): 61 U/L (ref 36–130)
BUN: 15 mg/dL (ref 7–25)
CO2: 26 mmol/L (ref 20–32)
Calcium: 9.2 mg/dL (ref 8.6–10.3)
Chloride: 103 mmol/L (ref 98–110)
Creat: 0.91 mg/dL (ref 0.60–1.35)
Globulin: 2.5 g/dL (calc) (ref 1.9–3.7)
Glucose, Bld: 81 mg/dL (ref 65–99)
Potassium: 4.6 mmol/L (ref 3.5–5.3)
Sodium: 140 mmol/L (ref 135–146)
Total Bilirubin: 0.5 mg/dL (ref 0.2–1.2)
Total Protein: 6.5 g/dL (ref 6.1–8.1)

## 2019-12-26 LAB — CBC WITH DIFFERENTIAL/PLATELET
Absolute Monocytes: 563 cells/uL (ref 200–950)
Basophils Absolute: 50 cells/uL (ref 0–200)
Basophils Relative: 0.6 %
Eosinophils Absolute: 210 cells/uL (ref 15–500)
Eosinophils Relative: 2.5 %
HCT: 50.4 % — ABNORMAL HIGH (ref 38.5–50.0)
Hemoglobin: 16.6 g/dL (ref 13.2–17.1)
Lymphs Abs: 1949 cells/uL (ref 850–3900)
MCH: 27.8 pg (ref 27.0–33.0)
MCHC: 32.9 g/dL (ref 32.0–36.0)
MCV: 84.4 fL (ref 80.0–100.0)
MPV: 11.1 fL (ref 7.5–12.5)
Monocytes Relative: 6.7 %
Neutro Abs: 5628 cells/uL (ref 1500–7800)
Neutrophils Relative %: 67 %
Platelets: 272 10*3/uL (ref 140–400)
RBC: 5.97 10*6/uL — ABNORMAL HIGH (ref 4.20–5.80)
RDW: 13.9 % (ref 11.0–15.0)
Total Lymphocyte: 23.2 %
WBC: 8.4 10*3/uL (ref 3.8–10.8)

## 2019-12-26 LAB — PSA: PSA: 0.52 ng/mL (ref <=4.0)

## 2020-01-15 ENCOUNTER — Telehealth: Payer: Self-pay

## 2020-01-15 NOTE — Telephone Encounter (Signed)
  LAST APPOINTMENT DATE: 12/25/2019   NEXT APPOINTMENT DATE:12/26/2020  MEDICATION: valsartan (DIOVAN) 40 MG tablet and WEGOVY  [id number] [address], [place]  Comments: Patient states that the trial of Wegovy was doing perfect. Just never got a call about his lab results from his last visit and would like to discuss it with someone.    Please advise

## 2020-01-18 MED ORDER — VALSARTAN 40 MG PO TABS: 40.0000 mg | ORAL_TABLET | Freq: Every day | ORAL | 1 refills | Status: DC

## 2020-01-18 MED ORDER — WEGOVY 0.25 MG/0.5ML ~~LOC~~ SOAJ: 0.2500 mg | SUBCUTANEOUS | 1 refills | Status: DC

## 2020-01-18 NOTE — Telephone Encounter (Signed)
Spoke to pt told him Rx's were sent to pharmacy for him. Reviewed labs and told him to continue diet and exercise as [place]ona Bar discussed at last visit. Pt verbalized understanding.

## 2020-01-21 NOTE — Telephone Encounter (Signed)
Prior Auth started thru Covermymeds awaiting response.

## 2020-01-21 NOTE — Telephone Encounter (Signed)
[facility] is [facility] in [facility].

## 2020-01-21 NOTE — Telephone Encounter (Signed)
Spoke to pt told him I started Prior Auth for [facility] waiting for response from insurance. Pt verbalized understanding.

## 2020-01-23 NOTE — Telephone Encounter (Signed)
Received message from [place]: [facility] Your prior authorization for Aaron Frost has been approved!  Called pharmacy and spoke to [place] told him Rx for [facility] prior authorization has been approved. Mitzi Hansen verbalized understanding and will go ahead and run it.  Pt notified PA for Aaron Frost was approved and I contacted the pharmacy. Pt verbalized understanding.

## 2020-03-10 DIAGNOSIS — K420 Umbilical hernia with obstruction, without gangrene: Secondary | ICD-10-CM | POA: Diagnosis not present

## 2020-06-15 ENCOUNTER — Other Ambulatory Visit: Payer: Self-pay | Admitting: Physician Assistant

## 2020-06-27 ENCOUNTER — Ambulatory Visit: Payer: BC Managed Care – PPO | Admitting: Dermatology

## 2020-06-27 ENCOUNTER — Other Ambulatory Visit: Payer: Self-pay

## 2020-06-27 DIAGNOSIS — D224 Melanocytic nevi of scalp and neck: Secondary | ICD-10-CM | POA: Diagnosis not present

## 2020-06-27 DIAGNOSIS — D1800 Hemangioma unspecified site: Secondary | ICD-10-CM

## 2020-06-27 DIAGNOSIS — Z1283 Encounter for screening for malignant neoplasm of skin: Secondary | ICD-10-CM

## 2020-06-27 DIAGNOSIS — I872 Venous insufficiency (chronic) (peripheral): Secondary | ICD-10-CM

## 2020-06-27 DIAGNOSIS — L730 Acne keloid: Secondary | ICD-10-CM | POA: Diagnosis not present

## 2020-06-27 DIAGNOSIS — D2239 Melanocytic nevi of other parts of face: Secondary | ICD-10-CM | POA: Diagnosis not present

## 2020-06-27 DIAGNOSIS — L578 Other skin changes due to chronic exposure to nonionizing radiation: Secondary | ICD-10-CM

## 2020-06-27 DIAGNOSIS — L814 Other melanin hyperpigmentation: Secondary | ICD-10-CM

## 2020-06-27 DIAGNOSIS — L821 Other seborrheic keratosis: Secondary | ICD-10-CM

## 2020-06-27 DIAGNOSIS — L918 Other hypertrophic disorders of the skin: Secondary | ICD-10-CM

## 2020-06-27 DIAGNOSIS — D229 Melanocytic nevi, unspecified: Secondary | ICD-10-CM

## 2020-06-27 MED ORDER — MUPIROCIN 2 % EX OINT: 1.0000 "application " | TOPICAL_OINTMENT | Freq: Every day | CUTANEOUS | 0 refills | Status: DC

## 2020-06-27 MED ORDER — DOXYCYCLINE HYCLATE 100 MG PO TABS: 100.0000 mg | ORAL_TABLET | Freq: Every day | ORAL | 3 refills | Status: AC

## 2020-06-27 MED ORDER — CLOBETASOL PROPIONATE 0.05 % EX FOAM: Freq: Two times a day (BID) | CUTANEOUS | 3 refills | Status: DC

## 2020-06-27 NOTE — Patient Instructions (Signed)
If you have any questions or concerns for your doctor, please call our main line at [phone] and press option 4 to reach your doctor's medical assistant. If no one answers, please leave a voicemail as directed and we will return your call as soon as possible. Messages left after 4 pm will be answered the following business day.   You may also send [place] a message via [place]. We typically respond to MyChart messages within 1-2 business days.  For prescription refills, please ask your pharmacy to contact our office. Our fax number is [phone].  If you have an urgent issue when the clinic is closed that cannot wait until the next business day, you can page your doctor at the number below.    Please note that while we do our best to be available for urgent issues outside of office hours, we are not available 24/7.   If you have an urgent issue and are unable to reach [place], you may choose to seek medical care at your doctor's office, retail clinic, urgent care center, or emergency room.  If you have a medical emergency, please immediately call 911 or go to the emergency department.  Pager Numbers  - Dr. Nehemiah Massed: [phone]  - Dr. Laurence Ferrari: [phone]  - Dr. Nicole Kindred: [phone]  In the event of inclement weather, please call our main line at [phone] for an update on the status of any delays or closures.  Dermatology Medication Tips: Please keep the boxes that topical medications come in in order to help keep track of the instructions about where and how to use these. Pharmacies typically print the medication instructions only on the boxes and not directly on the medication tubes.   If your medication is too expensive, please contact our office at [phone] option 4 or send [place] a message through [place].   We are unable to tell what your co-pay for medications will be in advance as this is different depending on your insurance coverage. However, we may be able to find a substitute  medication at lower cost or fill out paperwork to get insurance to cover a needed medication.   If a prior authorization is required to get your medication covered by your insurance company, please allow [place] 1-2 business days to complete this process.  Drug prices often vary depending on where the prescription is filled and some pharmacies may offer cheaper prices.  The website www.goodrx.com contains coupons for medications through different pharmacies. The prices here do not account for what the cost may be with help from insurance (it may be cheaper with your insurance), but the website can give you the price if you did not use any insurance.  - You can print the associated coupon and take it with your prescription to the pharmacy.  - You may also stop by our office during regular business hours and pick up a GoodRx coupon card.  - If you need your prescription sent electronically to a different pharmacy, notify our office through [facility] or by phone at [phone] option 4.  Doxycycline should be taken with food to prevent nausea. Do not lay down for 30 minutes after taking. Be cautious with sun exposure and use good sun protection while on this medication. Pregnant women should not take this medication.

## 2020-06-27 NOTE — Progress Notes (Signed)
New Patient Visit  Subjective  Aaron Frost is a 40 y.o. male who presents for the following: Annual Exam and rash (On the post scalp/neck x 2 years. Becoming progressively worse. Currently using an OTC shampoo, but no other treatment.). The patient presents for Total-Body Skin Exam (TBSE) for skin cancer screening and mole check.  The following portions of the chart were reviewed this encounter and updated as appropriate:   Tobacco  Allergies  Meds  Problems  Med Hx  Surg Hx  Fam Hx      Review of Systems:  No other skin or systemic complaints except as noted in HPI or Assessment and Plan.  Objective  Well appearing patient in no apparent distress; mood and affect are within normal limits.  A full examination was performed including scalp, head, eyes, ears, nose, lips, neck, chest, axillae, abdomen, back, buttocks, bilateral upper extremities, bilateral lower extremities, hands, feet, fingers, toes, fingernails, and toenails. All findings within normal limits unless otherwise noted below.  L forehead 0.5 cm brown macule slightly irregular   L ant neck 0.5 cm brown macule slightly irregular          B/L leg Erythematous, scaly patches involving the ankle and distal lower leg with associated lower leg edema.   Assessment & Plan  Acne keloidalis nuchae Post scalp/neck Chronic; and persistent. Advised treatable, but not curable -   Start Doxycyline 100mg  po QD with evening meal. Doxycycline should be taken with food to prevent nausea. Do not lay down for 30 minutes after taking. Be cautious with sun exposure and use good sun protection while on this medication. Pregnant women should not take this medication.   Start Clobetasol 0.05% QD-BID. Topical steroids (such as triamcinolone, fluocinolone, fluocinonide, mometasone, clobetasol, halobetasol, betamethasone, hydrocortisone) can cause thinning and lightening of the skin if they are used for too long in the same  area. Your physician has selected the right strength medicine for your problem and area affected on the body. Please use your medication only as directed by your physician to prevent side effects.   Start Mupirocin 2% ointment to aa's QD until healed.  Discussed ILK injections PRN. Do not use a razor when shaving head, use clippers instead.  doxycycline (VIBRA-TABS) 100 MG tablet - Post scalp/neck Take 1 tablet (100 mg total) by mouth daily. Take with food and plenty of drink. clobetasol (OLUX) 0.05 % topical foam - Post scalp/neck Apply topically 2 (two) times daily. Apply to aa's scalp/neck QD/BID PRN. mupirocin ointment (BACTROBAN) 2 % - Post scalp/neck Apply 1 application topically daily. Apply to aa's scalp/neck QD PRN.  Nevus (2) L forehead; L ant neck Benign-appearing.  Observation.  Call clinic for new or changing lesions.  Recommend daily use of broad spectrum spf 30+ sunscreen to sun-exposed areas.   Stasis dermatitis of both legs B/L leg Stasis in the legs causes chronic leg swelling, which may result in itchy or painful rashes, skin discoloration, skin texture changes, and sometimes ulceration.  Recommend daily compression hose/stockings- easiest to put on first thing in morning, remove at bedtime.  Elevate legs as much as possible. Avoid salt/sodium rich foods.   Skin cancer screening  Lentigines - Scattered tan macules - Due to sun exposure - Benign-appering, observe - Recommend daily broad spectrum sunscreen SPF 30+ to sun-exposed areas, reapply every 2 hours as needed. - Call for any changes  Seborrheic Keratoses - Stuck-on, waxy, tan-brown papules and/or plaques  - Benign-appearing - Discussed benign etiology and prognosis. -  Observe - Call for any changes  Melanocytic Nevi - Tan-brown and/or pink-flesh-colored symmetric macules and papules - Benign appearing on exam today - Observation - Call clinic for new or changing moles - Recommend daily use of broad  spectrum spf 30+ sunscreen to sun-exposed areas.   Hemangiomas - Red papules - Discussed benign nature - Observe - Call for any changes  Actinic Damage - Chronic condition, secondary to cumulative UV/sun exposure - diffuse scaly erythematous macules with underlying dyspigmentation - Recommend daily broad spectrum sunscreen SPF 30+ to sun-exposed areas, reapply every 2 hours as needed.  - Staying in the shade or wearing long sleeves, sun glasses (UVA+UVB protection) and wide brim hats (4-inch brim around the entire circumference of the hat) are also recommended for sun protection.  - Call for new or changing lesions.  Acrochordons (Skin Tags) - Fleshy, skin-colored pedunculated papules - Benign appearing.  - Observe. - If desired, they can be removed with an in office procedure that is not covered by insurance. - Please call the clinic if you notice any new or changing lesions.  Skin cancer screening performed today.  Return in about 2 months (around 08/27/2020) for [facility] f/u .  I, Rudell Cobb, CMA, am acting as scribe for Sarina Ser, MD .  Documentation: I have reviewed the above documentation for accuracy and completeness, and I agree with the above.  Sarina Ser, MD

## 2020-06-30 ENCOUNTER — Encounter: Payer: Self-pay | Admitting: Dermatology

## 2020-07-27 ENCOUNTER — Other Ambulatory Visit: Payer: Self-pay | Admitting: Physician Assistant

## 2020-08-29 ENCOUNTER — Ambulatory Visit: Payer: BC Managed Care – PPO | Admitting: Dermatology

## 2020-09-06 ENCOUNTER — Telehealth (INDEPENDENT_AMBULATORY_CARE_PROVIDER_SITE_OTHER): Payer: BC Managed Care – PPO | Admitting: Family Medicine

## 2020-09-06 ENCOUNTER — Encounter: Payer: Self-pay | Admitting: Family Medicine

## 2020-09-06 DIAGNOSIS — U071 COVID-19: Secondary | ICD-10-CM | POA: Diagnosis not present

## 2020-09-06 MED ORDER — BENZONATATE 100 MG PO CAPS: 100.0000 mg | ORAL_CAPSULE | Freq: Three times a day (TID) | ORAL | 0 refills | Status: DC | PRN

## 2020-09-06 MED ORDER — NIRMATRELVIR/RITONAVIR (PAXLOVID)TABLET: 3.0000 | ORAL_TABLET | Freq: Two times a day (BID) | ORAL | 0 refills | Status: AC

## 2020-09-06 NOTE — Patient Instructions (Addendum)
HOME CARE TIPS:  -I sent the medication(s) we discussed to your pharmacy: Meds ordered this encounter  Medications   nirmatrelvir/ritonavir EUA (PAXLOVID) 20 x 150 MG & 10 x 100MG TABS    Sig: Take 3 tablets by mouth 2 (two) times daily for 5 days. (Take nirmatrelvir 150 mg two tablets twice daily for 5 days and ritonavir 100 mg one tablet twice daily for 5 days) Patient GFR is 98    Dispense:  30 tablet    Refill:  0   benzonatate (TESSALON PERLES) 100 MG capsule    Sig: Take 1 capsule (100 mg total) by mouth 3 (three) times daily as needed.    Dispense:  20 capsule    Refill:  0     -I sent in the [facility] treatment or referral you requested per our discussion. Please see the information provided below and discuss further with the pharmacist/treatment team.  -If taking Paxlovid, please review all medications, supplement and over the counter drugs with your pharmacist and ask them to check for any interactions. Please make the following changes to your regular medications while taking Paxlovid: monitor for signs of low BP and hold you valsartan if BP is running low.   -If taking Paxlovid, there is a chance of rebound illness after finishing your treatment. If you become sick again please isolate for an additional 5 days.    -can use tylenol if needed for fevers, aches and pains per instructions  -can use nasal saline a few times per day if you have nasal congestion; sometimes  a short course of Afrin nasal spray for 3 days can help with symptoms as well  -stay hydrated, drink plenty of fluids and eat small healthy meals - avoid dairy  -If the Covid test is positive, check out the [facility] website for more information on home care, transmission and treatment for COVID19  -follow up with your doctor in 2-3 days unless improving and feeling better  -stay home while sick, except to seek medical care. If you have COVID19, ideally it would be best to stay home for a full 10 days since the onset  of symptoms PLUS one day of no fever and feeling better. Wear a good mask that fits snugly (such as N95 or KN95) if around others to reduce the risk of transmission.  It was nice to meet you today, and I really hope you are feeling better soon. I help [facility] out with telemedicine visits on Tuesdays and Thursdays and am available for visits on those days. If you have any concerns or questions following this visit please schedule a follow up visit with your Primary Care doctor or seek care at a local urgent care clinic to avoid delays in care.    Seek in person care or schedule a follow up video visit promptly if your symptoms worsen, new concerns arise or you are not improving with treatment. Call 911 and/or seek emergency care if your symptoms are severe or life threatening.  FACT SHEET FOR PATIENTS, PARENTS, AND CAREGIVERS EMERGENCY USE AUTHORIZATION (EUA) OF PAXLOVID FOR CORONAVIRUS DISEASE 2019 (COVID-19) You are being given this Fact Sheet because your healthcare provider believes it is necessary to provide you with PAXLOVID for the treatment of mild-to-moderate coronavirus disease (COVID-19) caused by the SARS-CoV-2 virus. This Fact Sheet contains information to help you understand the risks and benefits of taking the PAXLOVID you have received or may receive. The U.S. Food and Drug Administration (FDA) has issued an Emergency  Use Authorization (EUA) to make PAXLOVID available during the COVID-19 pandemic (for more details about an EUA please see "What is an Emergency Use Authorization?" at the end of this document). PAXLOVID is not an FDA-approved medicine in the [place]. Read this Fact Sheet for information about PAXLOVID. Talk to your healthcare provider about your options or if you have any questions. It is your choice to take PAXLOVID.  What is COVID-19? COVID-19 is caused by a virus called a coronavirus. You can get COVID-19 through close contact with another person who  has the virus. COVID-19 illnesses have ranged from very mild-to-severe, including illness resulting in death. While information so far suggests that most COVID-19 illness is mild, serious illness can happen and may cause some of your other medical conditions to become worse. Older people and people of all ages with severe, long lasting (chronic) medical conditions like heart disease, lung disease, and diabetes, for example seem to be at higher risk of being hospitalized for COVID-19.  What is PAXLOVID? PAXLOVID is an investigational medicine used to treat mild-to-moderate COVID-19 in adults and children [67 years of age and older weighing at least 11 pounds (66 kg)] with positive results of direct SARS-CoV-2 viral testing, and who are at high risk for progression to severe COVID-19, including hospitalization or death. PAXLOVID is investigational because it is still being studied. There is limited information about the safety and effectiveness of using PAXLOVID to treat people with mild-to-moderate COVID-19.  The FDA has authorized the emergency use of PAXLOVID for the treatment of mild-tomoderate COVID-19 in adults and children [78 years of age and older weighing at least 82 pounds (61 kg)] with a positive test for the virus that causes COVID-19, and who are at high risk for progression to severe COVID-19, including hospitalization or death, under an EUA. 1 Revised: 25 March 2020   What should I tell my healthcare provider before I take PAXLOVID? Tell your healthcare provider if you: ? Have any allergies ? Have liver or kidney disease ? Are pregnant or plan to become pregnant ? Are breastfeeding a child ? Have any serious illnesses  Tell your healthcare provider about all the medicines you take, including prescription and over-the-counter medicines, vitamins, and herbal supplements. Some medicines may interact with PAXLOVID and may cause serious side effects. Keep a list of your  medicines to show your healthcare provider and pharmacist when you get a new medicine.  You can ask your healthcare provider or pharmacist for a list of medicines that interact with PAXLOVID. Do not start taking a new medicine without telling your healthcare provider. Your healthcare provider can tell you if it is safe to take PAXLOVID with other medicines.  Tell your healthcare provider if you are taking combined hormonal contraceptive. PAXLOVID may affect how your birth control pills work. Females who are able to become pregnant should use another effective alternative form of contraception or an additional barrier method of contraception. Talk to your healthcare provider if you have any questions about contraceptive methods that might be right for you.  How do I take PAXLOVID? ? PAXLOVID consists of 2 medicines: nirmatrelvir and ritonavir. o Take 2 pink tablets of nirmatrelvir with 1 white tablet of ritonavir by mouth 2 times each day (in the morning and in the evening) for 5 days. For each dose, take all 3 tablets at the same time. o If you have kidney disease, talk to your healthcare provider. You may need a different dose. ? Swallow the  tablets whole. Do not chew, break, or crush the tablets. ? Take PAXLOVID with or without food. ? Do not stop taking PAXLOVID without talking to your healthcare provider, even if you feel better. ? If you miss a dose of PAXLOVID within 8 hours of the time it is usually taken, take it as soon as you remember. If you miss a dose by more than 8 hours, skip the missed dose and take the next dose at your regular time. Do not take 2 doses of PAXLOVID at the same time. ? If you take too much PAXLOVID, call your healthcare provider or go to the nearest hospital emergency room right away. ? If you are taking a ritonavir- or cobicistat-containing medicine to treat hepatitis C or Human Immunodeficiency Virus (HIV), you should continue to take your medicine  as prescribed by your healthcare provider. 2 Revised: 25 March 2020    Talk to your healthcare provider if you do not feel better or if you feel worse after 5 days.  Who should generally not take PAXLOVID? Do not take PAXLOVID if: ? You are allergic to nirmatrelvir, ritonavir, or any of the ingredients in PAXLOVID. ? You are taking any of the following medicines: o Alfuzosin o Pethidine, propoxyphene o Ranolazine o Amiodarone, dronedarone, flecainide, propafenone, quinidine o Colchicine o Lurasidone, pimozide, clozapine o Dihydroergotamine, ergotamine, methylergonovine o Lovastatin, simvastatin o Sildenafil (Revatio) for pulmonary arterial hypertension (PAH) o Triazolam, oral midazolam o Apalutamide o Carbamazepine, phenobarbital, phenytoin o Rifampin o St. John's Wort (hypericum perforatum) Taking PAXLOVID with these medicines may cause serious or life-threatening side effects or affect how PAXLOVID works.  These are not the only medicines that may cause serious side effects if taken with PAXLOVID. PAXLOVID may increase or decrease the levels of multiple other medicines. It is very important to tell your healthcare provider about all of the medicines you are taking because additional laboratory tests or changes in the dose of your other medicines may be necessary while you are taking PAXLOVID. Your healthcare provider may also tell you about specific symptoms to watch out for that may indicate that you need to stop or decrease the dose of some of your other medicines.  What are the important possible side effects of PAXLOVID? Possible side effects of PAXLOVID are: ? Allergic Reactions. Allergic reactions can happen in people taking PAXLOVID, even after only 1 dose. Stop taking PAXLOVID and call your healthcare provider right away if you get any of the following symptoms of an allergic reaction: o hives o trouble swallowing or breathing o swelling of the mouth, lips, or  face o throat tightness o hoarseness 3 Revised: 25 March 2020  o skin rash ? Liver Problems. Tell your healthcare provider right away if you have any of these signs and symptoms of liver problems: loss of appetite, yellowing of your skin and the whites of eyes (jaundice), dark-colored urine, pale colored stools and itchy skin, stomach area (abdominal) pain. ? Resistance to HIV Medicines. If you have untreated HIV infection, PAXLOVID may lead to some HIV medicines not working as well in the future. ? Other possible side effects include: o altered sense of taste o diarrhea o high blood pressure o muscle aches These are not all the possible side effects of PAXLOVID. Not many people have taken PAXLOVID. Serious and unexpected side effects may happen. PAXLOVID is still being studied, so it is possible that all of the risks are not known at this time.  What other treatment choices are  there? Veklury (remdesivir) is FDA-approved for the treatment of mild-to-moderate DPOEU-23 in certain adults and children. Talk with your doctor to see if Marijean Heath is appropriate for you. Like PAXLOVID, FDA may also allow for the emergency use of other medicines to treat people with COVID-19. Go to https://price.info/ for information on the emergency use of other medicines that are authorized by FDA to treat people with COVID-19. Your healthcare provider may talk with you about clinical trials for which you may be eligible. It is your choice to be treated or not to be treated with PAXLOVID. Should you decide not to receive it or for your child not to receive it, it will not change your standard medical care.  What if I am pregnant or breastfeeding? There is no experience treating pregnant women or breastfeeding mothers with PAXLOVID. For a mother and unborn baby, the benefit of taking PAXLOVID may  be greater than the risk from the treatment. If you are pregnant, discuss your options and specific situation with your healthcare provider. It is recommended that you use effective barrier contraception or do not have sexual activity while taking PAXLOVID. If you are breastfeeding, discuss your options and specific situation with your healthcare provider. 4 Revised: 25 March 2020   How do I report side effects with PAXLOVID? Contact your healthcare provider if you have any side effects that bother you or do not go away. Report side effects to FDA MedWatch at SmoothHits.hu or call 1-800-FDA1088 or you can report side effects to [employer]. at the contact information provided below. Website Fax number Telephone number www.pfizersafetyreporting.com [phone] How should I store [place]? Store PAXLOVID tablets at room temperature, between 68?F to 77?F (20?C to 25?C). How can I learn more about COVID-19? ? Ask your healthcare provider. ? Visit [email or web address]-johnson.[email or web address] ? Contact your local or state public health department. What is an Emergency Use Authorization (EUA)? The [place] FDA has made PAXLOVID available under an emergency access mechanism called an Emergency Use Authorization (EUA). The EUA is supported by a Education officer, museum and Human Service (HHS) declaration that circumstances exist to justify the emergency use of drugs and biological products during the COVID-19 pandemic. PAXLOVID for the treatment of mild-to-moderate COVID-19 in adults and children [42 years of age and older weighing at least 63 pounds (51 kg)] with positive results of direct SARS-CoV-2 viral testing, and who are at high risk for progression to severe COVID-19, including hospitalization or death, has not undergone the same type of review as an FDA-approved product. In issuing an EUA under the OIZTI-45 public health emergency, the FDA has determined, among other things,  that based on the total amount of scientific evidence available including data from adequate and well-controlled clinical trials, if available, it is reasonable to believe that the product may be effective for diagnosing, treating, or preventing COVID-19, or a serious or life-threatening disease or condition caused by COVID-19; that the known and potential benefits of the product, when used to diagnose, treat, or prevent such disease or condition, outweigh the known and potential risks of such product; and that there are no adequate, approved, and available alternatives. All of these criteria must be met to allow for the product to be used in the treatment of patients during the COVID-19 pandemic. The EUA for PAXLOVID is in effect for the duration of the COVID-19 declaration justifying emergency use of this product, unless terminated or revoked (after which the products may no longer be used under the EUA). 5 Revised:  25 March 2020     Additional Information For general questions, visit the website or call the telephone number provided below. Website Telephone number www.COVID19oralRx.com [phone] (1-877-C19-PACK) You can also go to www.pfizermedinfo.com or call [phone] for more information. [id number] Revised: 25 March 2020

## 2020-09-06 NOTE — Progress Notes (Signed)
Virtual Visit via Video Note  I connected with Aaron Frost  on 09/06/20 at 10:00 AM EDT by a video enabled telemedicine application and verified that I am speaking with the correct person using two identifiers.  Location patient: home, [name] Location provider:work or home office Persons participating in the virtual visit: patient, provider  I discussed the limitations of evaluation and management by telemedicine and the availability of in person appointments. The patient expressed understanding and agreed to proceed.   HPI:  Acute telemedicine visit for Covid19: -Onset: yesterday; positive covid test yesterday, parents positive with covid -Symptoms include: low grade temperature, chills, ha, body aches, sinus issues -Denies: CP, wheezing, SOB, NVD, inability to eat, drink, get out of bed -Has tried:none -Pertinent past medical history: obesity, HTN, asthma  -Pertinent medication allergies:  Allergies  Allergen Reactions   Aleve [Naproxen] Other (See Comments)    Burning sensation and headaches   Nickel Rash  -COVID-19 vaccine status: vaccinated and had 2 boosters -eGFR 98 in the last year and stable on prior labs -he really wishes to take Paxlovi  ROS: See pertinent positives and negatives per HPI.  Past Medical History:  Diagnosis Date   Asthma 08/09/2017   OSA on CPAP    Umbilical hernia 2025    Past Surgical History:  Procedure Laterality Date   EXTERNAL EAR SURGERY     right ear reconstruction   MOLE REMOVAL     Baco of Neck   TONSILLECTOMY  2005     Current Outpatient Medications:    acetaminophen (TYLENOL) 500 MG tablet, Take 500 mg by mouth every 6 (six) hours as needed for mild pain. , Disp: , Rfl:    albuterol (PROVENTIL) (2.5 MG/3ML) 0.083% nebulizer solution, Take 1 vial by nebulization every 4-6 hours as needed for wheezing, Disp: 150 mL, Rfl: 5   albuterol (VENTOLIN HFA) 108 (90 Base) MCG/ACT inhaler, Inhale 2 puffs every 4-6 hours as needed for wheezing, Disp: 8  g, Rfl: 5   benzonatate (TESSALON PERLES) 100 MG capsule, Take 1 capsule (100 mg total) by mouth 3 (three) times daily as needed., Disp: 20 capsule, Rfl: 0   clobetasol (OLUX) 0.05 % topical foam, Apply topically 2 (two) times daily. Apply to aa's scalp/neck QD/BID PRN., Disp: 50 g, Rfl: 3   doxycycline (VIBRA-TABS) 100 MG tablet, Take 1 tablet (100 mg total) by mouth daily. Take with food and plenty of drink., Disp: 30 tablet, Rfl: 3   fluticasone (FLONASE) 50 MCG/ACT nasal spray, Place 2 sprays into both nostrils daily., Disp: 16 g, Rfl: 6   montelukast (SINGULAIR) 10 MG tablet, Take 1 tablet (10 mg total) by mouth at bedtime., Disp: 90 tablet, Rfl: 0   Multiple Vitamin (MULTIVITAMIN WITH MINERALS) TABS tablet, Take 1 tablet by mouth daily., Disp: , Rfl:    mupirocin ointment (BACTROBAN) 2 %, Apply 1 application topically daily. Apply to aa's scalp/neck QD PRN., Disp: 22 g, Rfl: 0   nirmatrelvir/ritonavir EUA (PAXLOVID) 20 x 150 MG & 10 x 100MG TABS, Take 3 tablets by mouth 2 (two) times daily for 5 days. (Take nirmatrelvir 150 mg two tablets twice daily for 5 days and ritonavir 100 mg one tablet twice daily for 5 days) Patient GFR is 98, Disp: 30 tablet, Rfl: 0   omeprazole (PRILOSEC OTC) 20 MG tablet, Take 20 mg by mouth daily as needed. , Disp: , Rfl:    valsartan (DIOVAN) 40 MG tablet, TAKE 1 TABLET BY MOUTH EVERY DAY, Disp: 90 tablet, Rfl:  1   vitamin B-12 (CYANOCOBALAMIN) 1000 MCG tablet, Take 1,000 mcg by mouth daily., Disp: , Rfl:    WEGOVY 0.25 MG/0.5ML SOAJ, INJECT 0.25MG INTO THE SKIN ONE TIME PER WEEK, Disp: 2 mL, Rfl: 1   Zinc 30 MG CAPS, Take 30 mg by mouth., Disp: , Rfl:   EXAM:  VITALS per patient if applicable:  GENERAL: alert, oriented, appears well and in no acute distress  HEENT: atraumatic, conjunttiva clear, no obvious abnormalities on inspection of external nose and ears  NECK: normal movements of the head and neck  LUNGS: on inspection no signs of respiratory  distress, breathing rate appears normal, no obvious gross SOB, gasping or wheezing  CV: no obvious cyanosis  MS: moves all visible extremities without noticeable abnormality  PSYCH/NEURO: pleasant and cooperative, no obvious depression or anxiety, speech and thought processing grossly intact  ASSESSMENT AND PLAN:  Discussed the following assessment and plan:  COVID-19   Discussed treatment options (infusions and oral options and risk of drug interactions), ideal treatment window, potential complications, isolation and precautions for COVID-19.  Discussed possibility of rebound with antivirals and the need to reisolate if it should occur for 5 days. Checked for/reviewed any labs done in the last 90 days with GFR listed in HPI if available. After lengthy discussion, the patient opted for treatment with PAxlovid due to being higher risk for complications of covid or severe disease and other factors. HE did not wish to go get updated labs as feels his kidney function has been stable and had labs in the last year. Discussed EUA status of this drug and the fact that there is preliminary limited knowledge of risks/interactions/side effects per EUA document vs possible benefits and precautions. This information was shared with patient during the visit and also was provided in patient instructions. Also, advised that patient discuss risks/interactions and use with pharmacist/treatment team as well. The patient did want a prescription for cough, Tessalon Rx sent.  Other symptomatic care measures summarized in patient instructions. Work/School slipped offered:  declined Advised to seek prompt in person care if worsening, new symptoms arise, or if is not improving with treatment. Discussed options for inperson care if PCP office not available. Did let this patient know that I only do telemedicine on Tuesdays and Thursdays for [place]. Advised to schedule follow up visit with PCP or [facility] if any further questions  or concerns to avoid delays in care.   I discussed the assessment and treatment plan with the patient. The patient was provided an opportunity to ask questions and all were answered. The patient agreed with the plan and demonstrated an understanding of the instructions.     Lucretia Kern, DO

## 2020-09-25 DIAGNOSIS — Z23 Encounter for immunization: Secondary | ICD-10-CM | POA: Diagnosis not present

## 2020-09-25 DIAGNOSIS — H10021 Other mucopurulent conjunctivitis, right eye: Secondary | ICD-10-CM | POA: Diagnosis not present

## 2020-11-12 ENCOUNTER — Other Ambulatory Visit: Payer: Self-pay | Admitting: Dermatology

## 2020-11-12 DIAGNOSIS — L730 Acne keloid: Secondary | ICD-10-CM

## 2020-12-23 NOTE — Progress Notes (Signed)
Subjective:    Aaron Frost is a 40 y.o. male and is here for a comprehensive physical exam.  HPI  Health Maintenance Due  Topic Date Due   Pneumococcal Vaccine 26-79 Years old (1 - PCV) Never done   Hepatitis C Screening  Never done   COVID-19 Vaccine (4 - Booster for Pfizer series) 12/07/2019    Acute Concerns: Abdominal Mass  States he noticed a small abdominal mass on his LUQ that has been present for a couple of weeks. Pt does admit that he often leans against this part of his abdomen on his desk at work. At this time he would like to have this evaluated further just to be safe rather than sorry. He does report he had one in the past on his back that was present for years, but one day it popped following the start of a new medication per past dermatologist. Denies pain.   Skin Rash During our prior visit, Aaron Frost expressed concerns of itchy plaques on the back of his scalp. At this time he is still having this concern and has reported he did follow up with past referred dermatology. Due to difficulty keeping contact with that office, he would like to be referred to a different dermatologist. Denies oozing of purulent drainage.   Obesity/Pre-DM Aaron Frost has found it difficult to lose weight due to managing his work schedule and stress levels. At this time he is interested in trialing a medication due to desire to decrease his weight so he can proceed with the removal of his umbilical hernia. In the past he was given  sample of wegovy and found this to be beneficial but due to shortage, was unable to continue use.   Chronic Issues: HTN Currently taking diovan 40 mg daily, usually around 11am due to not eating breakfast. States he hasn't taken his medication upon today's visit. At home blood pressure readings are: not checked often. Patient denies chest pain, SOB, blurred vision, dizziness, unusual headaches, lower leg swelling. Denies excessive caffeine intake, stimulant usage,  excessive alcohol intake, or increase in salt consumption.  BP Readings from Last 3 Encounters:  12/26/20 (!) 144/90  12/25/19 134/84  02/16/19 (!) 141/88   Obstructive Sleep Apnea Patient is currently compliant with using CPAP nightly with no complications. He is tolerating well.   Umbilical Hernia  Aaron Frost states he did follow up with general surgery as referred per last visit and was stated he would need to lose weight, about 60 pounds, before moving forward with removal.   Health Maintenance: Immunizations -- Covid- UTD Influenza-  UTD Tdap- UTD; 2013 Colonoscopy -- N/A PSA --  Lab Results  Component Value Date   PSA 0.52 12/25/2019   Diet -- Eats all food groups Ophthalmology- Will schedule soon Dentistry- UTD Sleep habits -- Normal schedule  Exercise -- Not currently  Weight -- Increased 9 pounds Weight history Wt Readings from Last 10 Encounters:  12/26/20 (!) 457 lb 8 oz (207.5 kg)  12/25/19 (!) 446 lb (202.3 kg)  05/28/19 (!) 451 lb (204.6 kg)  02/16/19 (!) 451 lb 12.8 oz (204.9 kg)  10/08/18 (!) 428 lb 9.6 oz (194.4 kg)  07/21/18 (!) 407 lb (184.6 kg)  07/04/18 (!) 407 lb (184.6 kg)  03/13/18 (!) 431 lb (195.5 kg)  03/11/18 (!) 432 lb (196 kg)  02/13/18 (!) 433 lb 4 oz (196.5 kg)   Body mass index is 60.36 kg/m. Mood -- Stable Tobacco use --  Tobacco Use: Medium Risk  Smoking Tobacco Use: Former   Smokeless Tobacco Use: Never   Passive Exposure: Not on file    Alcohol use --- Very rare use; 1 glass of wine around holidays  Depression screen [facility] 2/9 12/26/2020  Decreased Interest 1  Down, Depressed, Hopeless 0  PHQ - 2 Score 1  Altered sleeping 0  Tired, decreased energy 2  Change in appetite 2  Feeling bad or failure about yourself  0  Trouble concentrating 1  Moving slowly or fidgety/restless 1  Suicidal thoughts 0  PHQ-9 Score 7  Difficult doing work/chores Not difficult at all     Other providers/specialists: Patient Care  Team: Inda Coke, [place] as PCP - General (Physician Assistant) Elouise Munroe, MD as PCP - Cardiology (Cardiology)   PMHx, SurgHx, SocialHx, Medications, and Allergies were reviewed in the Visit Navigator and updated as appropriate.   Past Medical History:  Diagnosis Date   Asthma 08/09/2017   OSA on CPAP    Umbilical hernia 4580     Past Surgical History:  Procedure Laterality Date   EXTERNAL EAR SURGERY     right ear reconstruction   MOLE REMOVAL     Baco of Neck   TONSILLECTOMY  2005     Family History  Problem Relation Age of Onset   Hypertension Mother        grandfather/uncles   Heart disease Father    Cancer Sister        male   Diabetes Maternal Grandfather    Colon cancer Paternal Grandfather 40   Heart disease Paternal Grandfather    Stroke Paternal Grandfather    Breast cancer Neg Hx    Prostate cancer Neg Hx     Social History   Tobacco Use   Smoking status: Former    Packs/day: 1.50    Years: 3.00    Pack years: 4.50    Types: Cigarettes    Quit date: 01/09/2004    Years since quitting: 16.9   Smokeless tobacco: Never  Substance Use Topics   Alcohol use: Yes    Comment: very rare glass of wine   Drug use: No    Review of Systems:   Review of Systems  Constitutional:  Negative for chills, fever, malaise/fatigue and weight loss.  HENT:  Negative for hearing loss, sinus pain and sore throat.   Respiratory:  Negative for cough and hemoptysis.   Cardiovascular:  Negative for chest pain, palpitations, leg swelling and PND.  Gastrointestinal:  Negative for abdominal pain, constipation, diarrhea, heartburn, nausea and vomiting.  Genitourinary:  Negative for dysuria, frequency and urgency.  Musculoskeletal:  Negative for back pain, myalgias and neck pain.  Skin:  Negative for itching and rash.  Neurological:  Negative for dizziness, tingling, seizures and headaches.  Endo/Heme/Allergies:  Negative for polydipsia.  Psychiatric/Behavioral:   Negative for depression. The patient is not nervous/anxious.    Objective:   Vitals:   12/26/20 0833  BP: (!) 144/90  Pulse: 71  Temp: 97.7 F (36.5 C)  SpO2: 96%   Body mass index is 60.36 kg/m.  General Appearance:  Alert, cooperative, no distress, appears stated age  Head:  Normocephalic, without obvious abnormality, atraumatic  Eyes:  PERRL, conjunctiva/corneas clear, EOM's intact, fundi benign, both eyes       Ears:  Normal TM's and external ear canals, both ears  Nose: Nares normal, septum midline, mucosa normal, no drainage    or sinus tenderness  Throat: Lips, mucosa, and tongue normal; teeth and  gums normal  Neck: Supple, symmetrical, trachea midline, no adenopathy; thyroid:  No enlargement/tenderness/nodules; no carotit bruit or JVD  Back:   Symmetric, no curvature, ROM normal, no CVA tenderness  Lungs:   Clear to auscultation bilaterally, respirations unlabored  Chest wall:  No tenderness or deformity  Heart:  Regular rate and rhythm, S1 and S2 normal, no murmur, rub   or gallop  Abdomen:   Soft, non-tender, bowel sounds active all four quadrants  Small nodule in LUQ without TTP  Large abdominal hernia present  Extremities: Extremities normal, atraumatic, no cyanosis or edema  Prostate: Not done.   Skin: Skin color, texture, turgor normal, no rashes or lesions  Lymph nodes: Cervical, supraclavicular, and axillary nodes normal  Neurologic: CNII-XII grossly intact. Normal strength, sensation and reflexes throughout    Assessment/Plan:   Encounter for general adult medical examination with abnormal findings Today patient counseled on age appropriate routine health concerns for screening and prevention, each reviewed and up to date or declined. Immunizations reviewed and up to date or declined. Labs ordered and reviewed. Risk factors for depression reviewed and negative. Hearing function and visual acuity are intact. ADLs screened and addressed as needed. Functional  ability and level of safety reviewed and appropriate. Education, counseling and referrals performed based on assessed risks today. Patient provided with a copy of personalized plan for preventive services.  OSA on CPAP Continue compliance with CPAP Encouraged follow-up with pulm to determine  Umbilical hernia without obstruction and without gangrene We reviewed red flags that would warrant ER evaluation No red flags on exam  Obesity without serious comorbidity, unspecified classification, unspecified obesity type Provide information handout on Ozempic today Patient will reach out upon desired use of Ozempic as well as when insurance will provide coverage (approx Jan 1) Discussed possibility of transitioning to [facility] around summer time if/when medication is available  Prediabetes Will update labs today, provide recommendations sa indicated by labs results  -Hemoglobin A1c  Essential hypertension Uncontrolled in office however he has not taken it today yet and has no signs/sx of end-organ damage Maintain Diovan 40 mg daily Monitor BP at home regularly, 2-3 times a week I advised patient that if BP reads consistently, >140/90, to reach out to office and medication will be adjusted accordingly  Left upper quadrant abdominal mass Unclear etiology [place] of abdomen ordered for further information Will follow up as indicated by results   Rash Referral to dermatology for second opinion per patient request Denies acute needs today  Patient Counseling: [x]   Nutrition: Stressed importance of moderation in sodium/caffeine intake, saturated fat and cholesterol, caloric balance, sufficient intake of fresh fruits, vegetables, and fiber.  [x]   Stressed the importance of regular exercise.   []   Substance Abuse: Discussed cessation/primary prevention of tobacco, alcohol, or other drug use; driving or other dangerous activities under the influence; availability of treatment for abuse.   [x]   Injury  prevention: Discussed safety belts, safety helmets, smoke detector, smoking near bedding or upholstery.   []   Sexuality: Discussed sexually transmitted diseases, partner selection, use of condoms, avoidance of unintended pregnancy  and contraceptive alternatives.   [x]   Dental health: Discussed importance of regular tooth brushing, flossing, and dental visits.  [x]   Health maintenance and immunizations reviewed. Please refer to Health maintenance section.    I,Havlyn C Ratchford,acting as a scribe for [employer], PA.,have documented all relevant documentation on the behalf of Inda Coke, PA,as directed by  Inda Coke, PA while in the presence of  Inda Coke, PA.  I, Inda Coke, [place], have reviewed all documentation for this visit. The documentation on 12/26/20 for the exam, diagnosis, procedures, and orders are all accurate and complete.   Inda Coke, PA-C [place]

## 2020-12-26 ENCOUNTER — Encounter: Payer: Self-pay | Admitting: Physician Assistant

## 2020-12-26 ENCOUNTER — Ambulatory Visit (INDEPENDENT_AMBULATORY_CARE_PROVIDER_SITE_OTHER): Payer: BC Managed Care – PPO | Admitting: Physician Assistant

## 2020-12-26 VITALS — BP 144/90 | HR 71 | Temp 97.7°F | Ht 73.0 in | Wt >= 6400 oz

## 2020-12-26 DIAGNOSIS — I10 Essential (primary) hypertension: Secondary | ICD-10-CM | POA: Diagnosis not present

## 2020-12-26 DIAGNOSIS — G4733 Obstructive sleep apnea (adult) (pediatric): Secondary | ICD-10-CM | POA: Diagnosis not present

## 2020-12-26 DIAGNOSIS — R1902 Left upper quadrant abdominal swelling, mass and lump: Secondary | ICD-10-CM | POA: Diagnosis not present

## 2020-12-26 DIAGNOSIS — Z0001 Encounter for general adult medical examination with abnormal findings: Secondary | ICD-10-CM

## 2020-12-26 DIAGNOSIS — R7303 Prediabetes: Secondary | ICD-10-CM

## 2020-12-26 DIAGNOSIS — Z9989 Dependence on other enabling machines and devices: Secondary | ICD-10-CM

## 2020-12-26 DIAGNOSIS — E669 Obesity, unspecified: Secondary | ICD-10-CM

## 2020-12-26 DIAGNOSIS — K429 Umbilical hernia without obstruction or gangrene: Secondary | ICD-10-CM

## 2020-12-26 DIAGNOSIS — R21 Rash and other nonspecific skin eruption: Secondary | ICD-10-CM

## 2020-12-26 LAB — COMPREHENSIVE METABOLIC PANEL
ALT: 21 U/L (ref 0–53)
AST: 19 U/L (ref 0–37)
Albumin: 4.1 g/dL (ref 3.5–5.2)
Alkaline Phosphatase: 62 U/L (ref 39–117)
BUN: 12 mg/dL (ref 6–23)
CO2: 29 mEq/L (ref 19–32)
Calcium: 9.2 mg/dL (ref 8.4–10.5)
Chloride: 99 mEq/L (ref 96–112)
Creatinine, Ser: 0.91 mg/dL (ref 0.40–1.50)
GFR: 104.69 mL/min (ref >60.00)
Glucose, Bld: 102 mg/dL — ABNORMAL HIGH (ref 70–99)
Potassium: 4.3 mEq/L (ref 3.5–5.1)
Sodium: 136 mEq/L (ref 135–145)
Total Bilirubin: 0.9 mg/dL (ref 0.2–1.2)
Total Protein: 7.2 g/dL (ref 6.0–8.3)

## 2020-12-26 LAB — LIPID PANEL
Cholesterol: 151 mg/dL (ref 0–200)
HDL: 37.8 mg/dL — ABNORMAL LOW (ref >39.00)
LDL Cholesterol: 100 mg/dL — ABNORMAL HIGH (ref 0–99)
NonHDL: 112.92
Total CHOL/HDL Ratio: 4
Triglycerides: 66 mg/dL (ref 0.0–149.0)
VLDL: 13.2 mg/dL (ref 0.0–40.0)

## 2020-12-26 LAB — CBC WITH DIFFERENTIAL/PLATELET
Basophils Absolute: 0.1 10*3/uL (ref 0.0–0.1)
Basophils Relative: 0.9 % (ref 0.0–3.0)
Eosinophils Absolute: 0.2 10*3/uL (ref 0.0–0.7)
Eosinophils Relative: 2.8 % (ref 0.0–5.0)
HCT: 51 % (ref 39.0–52.0)
Hemoglobin: 16.6 g/dL (ref 13.0–17.0)
Lymphocytes Relative: 18.4 % (ref 12.0–46.0)
Lymphs Abs: 1.5 10*3/uL (ref 0.7–4.0)
MCHC: 32.6 g/dL (ref 30.0–36.0)
MCV: 84.4 fl (ref 78.0–100.0)
Monocytes Absolute: 0.5 10*3/uL (ref 0.1–1.0)
Monocytes Relative: 6.2 % (ref 3.0–12.0)
Neutro Abs: 5.9 10*3/uL (ref 1.4–7.7)
Neutrophils Relative %: 71.7 % (ref 43.0–77.0)
Platelets: 219 10*3/uL (ref 150.0–400.0)
RBC: 6.05 Mil/uL — ABNORMAL HIGH (ref 4.22–5.81)
RDW: 15.4 % (ref 11.5–15.5)
WBC: 8.2 10*3/uL (ref 4.0–10.5)

## 2020-12-26 LAB — HEMOGLOBIN A1C: Hgb A1c MFr Bld: 6.3 % (ref 4.6–6.5)

## 2020-12-26 MED ORDER — VALSARTAN 40 MG PO TABS: 40.0000 mg | ORAL_TABLET | Freq: Every day | ORAL | 3 refills | Status: DC

## 2020-12-26 NOTE — Patient Instructions (Signed)
It was great to see you!  Message me after the first of the year and we will send in [place] for you  I will put in referral to dermatology for you  You will be contacted about an ultrasound   Please go to the lab for blood work.   Our office will call you with your results unless you have chosen to receive results via MyChart.  If your blood work is normal we will follow-up each year for physicals and as scheduled for chronic medical problems.  If anything is abnormal we will treat accordingly and get you in for a follow-up.  Take care,  Aldona Bar

## 2021-01-16 ENCOUNTER — Ambulatory Visit
Admission: RE | Admit: 2021-01-16 | Discharge: 2021-01-16 | Disposition: A | Discharge status: Home / Self Care | Payer: BC Managed Care – PPO | Source: Ambulatory Visit | Attending: Physician Assistant | Admitting: Physician Assistant

## 2021-01-16 DIAGNOSIS — R1902 Left upper quadrant abdominal swelling, mass and lump: Secondary | ICD-10-CM

## 2021-01-20 ENCOUNTER — Telehealth: Payer: Self-pay | Admitting: Physician Assistant

## 2021-01-20 ENCOUNTER — Telehealth: Payer: Self-pay | Admitting: *Deleted

## 2021-01-20 NOTE — Telephone Encounter (Signed)
Pt wants Aldona Bar to contact him regarding the MyChart message that she sent to him. Pt states that he cannot reply on MyChart. Patient also wants advice from [place]. Please call pt at the number listed.     [facility].

## 2021-01-20 NOTE — Telephone Encounter (Signed)
Error see other message.

## 2021-01-20 NOTE — Telephone Encounter (Signed)
Spoke to pt he said he was unable to respond to message about U/S done.  Your ultrasound shows nothing... if the area is still there, we could proceed with a CT scan for a closer look. Let me know your thoughts.  Written by Inda Coke, PA on 01/17/2021  8:07 AM EST Seen by patient Aaron Frost on 01/18/2021 10:59 AM Told him it is up to him if he wants to wait and see if spot on abdomen changes or if develops any other symptoms.Pt said he will be in touch if he decides to have done. Told him okay.   Also pt said about the medication insurance has been changed in the system. Asked pt if he contacted the pharmacy with new info? Pt said no. Told him need to do that first and then I can send over Rx for Ozempic. Told him to send me a My Chart message once done. Pt verbalized understanding.

## 2021-04-07 ENCOUNTER — Telehealth: Payer: Self-pay

## 2021-04-07 NOTE — Telephone Encounter (Signed)
MEDICATION: valsartan (DIOVAN) 40 MG tablet ? ?PHARMACY[facility]  [id number] [place]  [phone] ?Fax:  [phone] ?  ? ? ?Comments:  ? ?**Let patient know to contact pharmacy at the end of the day to make sure medication is ready. ** ? ?** Please notify patient to allow 48-72 hours to process** ? ?**Encourage patient to contact the pharmacy for refills or [facility] can [facility] ? ?

## 2021-04-10 ENCOUNTER — Other Ambulatory Visit: Payer: Self-pay | Admitting: *Deleted

## 2021-04-10 MED ORDER — VALSARTAN 40 MG PO TABS
40.0000 mg | ORAL_TABLET | Freq: Every day | ORAL | 3 refills | Status: DC
Start: 2021-04-10 — End: 2022-05-17

## 2021-04-10 NOTE — Telephone Encounter (Signed)
Rx was send to pharmacy  

## 2021-05-03 ENCOUNTER — Telehealth: Payer: Self-pay

## 2021-05-03 ENCOUNTER — Encounter: Payer: Self-pay | Admitting: Physician Assistant

## 2021-05-03 NOTE — Telephone Encounter (Signed)
Please see message. °

## 2021-05-03 NOTE — Telephone Encounter (Signed)
Patient states he needs a letter to go to his employer stating he has a diagnosis of sleep apnea.    Please follow up in regard.  I have advised patient to also reach out to pulmonary in case employer ends up requesting work restrictions.

## 2021-05-03 NOTE — Telephone Encounter (Signed)
Left message on voicemail to call office.  

## 2021-05-17 ENCOUNTER — Encounter: Payer: Self-pay | Admitting: Pulmonary Disease

## 2021-05-17 ENCOUNTER — Ambulatory Visit (INDEPENDENT_AMBULATORY_CARE_PROVIDER_SITE_OTHER): Payer: BC Managed Care – PPO | Admitting: Pulmonary Disease

## 2021-05-17 VITALS — BP 120/80 | HR 64 | Temp 97.9°F | Ht 73.0 in | Wt >= 6400 oz

## 2021-05-17 DIAGNOSIS — G4733 Obstructive sleep apnea (adult) (pediatric): Secondary | ICD-10-CM

## 2021-05-17 NOTE — Progress Notes (Signed)
? ?Subjective:  ? ? Patient ID: Aaron Frost, male    DOB: 1980/06/25, 41 y.o.   MRN: [id number] ? ?Chief complaint: ? ?Follow-up for obstructive sleep apnea ?Compliant with CPAP ?Machine is over 55 years old ? ?HPI ?History of severe obstructive sleep apnea with severe oxygen desaturations ?History of obstructive sleep apnea ? ?Has been compliant with CPAP use ? ?Last study was in 2013 showing severe obstructive sleep apnea with severe oxygen desaturations ? ?At some point had severe respiratory problems which required oxygen supplementation during the day, was able to wean off oxygen supplementation ? ?Currently is focused on weight loss efforts which is working for him ? ?His sleep is variable ?Usually goes to bed about 9 PM to 11 ?Few awakenings-bathroom, pain and discomfort ?Final wake up time between 630 and 7 ? ? ?He has a history of multiple allergies, history of hypertension, history of asthma, history of depression, history of reflux ? ?No recent significant concerns with his health ?No recent hospitalizations or hospital visits ? ? ?Review of Systems  ?Constitutional:  Negative for fever and unexpected weight change.  ?Eyes:  Negative for redness and itching.  ?Respiratory:  Positive for apnea. Negative for cough and shortness of breath.   ?Cardiovascular:  Negative for chest pain.  ?Genitourinary:  Negative for dysuria.  ?Musculoskeletal:  Negative for joint swelling.  ?Skin:  Negative for rash.  ?Allergic/Immunologic: Negative.   ?Psychiatric/Behavioral:  Positive for sleep disturbance.   ?Past Medical History:  ?Diagnosis Date  ? Asthma 08/09/2017  ? OSA on CPAP   ? Umbilical hernia 0569  ? ?Social History  ? ?Socioeconomic History  ? Marital status: Divorced  ?  Spouse name: Not on file  ? Number of children: Not on file  ? Years of education: Not on file  ? Highest education level: Not on file  ?Occupational History  ? Occupation: customer service  ?  Employer: [employer]  ?Tobacco Use  ? Smoking  status: Former  ?  Packs/day: 1.50  ?  Years: 3.00  ?  Pack years: 4.50  ?  Types: Cigarettes  ?  Quit date: 01/09/2004  ?  Years since quitting: 17.3  ? Smokeless tobacco: Never  ?Substance and Sexual Activity  ? Alcohol use: Yes  ?  Comment: very rare glass of wine  ? Drug use: No  ? Sexual activity: Never  ?Other Topics Concern  ? Not on file  ?Social History Narrative  ? Work for [employer] -- sedentary  ? Divorced, Dec 2015   ? Live in [place]  ? Fun: walk, watch TV  ? ?Social Determinants of Health  ? ?Financial Resource Strain: Not on file  ?Food Insecurity: Not on file  ?Transportation Needs: Not on file  ?Physical Activity: Not on file  ?Stress: Not on file  ?Social Connections: Not on file  ?Intimate Partner Violence: Not on file  ? ?Current Outpatient Medications on File Prior to Visit  ?Medication Sig Dispense Refill  ? acetaminophen (TYLENOL) 500 MG tablet Take 500 mg by mouth every 6 (six) hours as needed for mild pain.     ? fluticasone (FLONASE) 50 MCG/ACT nasal spray Place 2 sprays into both nostrils daily. 16 g 6  ? Multiple Vitamin (MULTIVITAMIN WITH MINERALS) TABS tablet Take 1 tablet by mouth daily.    ? omeprazole (PRILOSEC OTC) 20 MG tablet Take 20 mg by mouth daily as needed.     ? valsartan (DIOVAN) 40 MG tablet Take 1 tablet (40  mg total) by mouth daily. 90 tablet 3  ? Zinc 30 MG CAPS Take 30 mg by mouth.    ? [DISCONTINUED] hydrochlorothiazide 25 MG tablet Take 1 tablet (25 mg total) by mouth daily. 30 tablet 11  ? ?No current facility-administered medications on file prior to visit.  ? ? ?Vitals:  ? 05/17/21 1438  ?BP: 120/80  ?Pulse: 64  ?Temp: 97.9 ?F (36.6 ?C)  ?SpO2: 96%  ?ls ? ?   ?Objective:  ? Physical Exam ?Constitutional:   ?   General: He is not in acute distress. ?   Appearance: He is obese. He is not diaphoretic.  ?   Comments: Obese  ?HENT:  ?   Nose: No congestion or rhinorrhea.  ?Eyes:  ?   General:     ?   Right eye: No discharge.  ?   Extraocular Movements:  Extraocular movements intact.  ?   Pupils: Pupils are equal, round, and reactive to light.  ?Neck:  ?   Thyroid: No thyromegaly.  ?   Trachea: No tracheal deviation.  ?Cardiovascular:  ?   Rate and Rhythm: Normal rate and regular rhythm.  ?   Heart sounds: No murmur heard. ?Pulmonary:  ?   Effort: Pulmonary effort is normal. No respiratory distress.  ?   Breath sounds: No stridor. No wheezing, rhonchi or rales.  ?Musculoskeletal:  ?   Cervical back: No rigidity.  ?Neurological:  ?   Mental Status: He is alert.  ?Psychiatric:     ?   Mood and Affect: Mood normal.  ? ?  Recent chest x-ray 08/11/2017-reviewed, no acute infiltrate ? ?Recent EKG unremarkable ? ?Assessment & Plan:  ?.  Class III obesity ?-Working on weight loss ?-Is managing to lose some weight ? ?.  Asthma ?-Stable symptoms ?-Bronchodilators as needed ? ?.  Obstructive sleep apnea ?-Compliant with CPAP use ?-Machine does not appear to be as effective as it was once ?-Remains very compliant with use ? ?Marland Kitchen  Environmental allergies ? ?Plan: ? ?He does need a repeat study at present ? ?Encouraged to continue to focus on weight loss efforts ? ?Follow-up in 6 months ? ?With very severe oxygen desaturations in the past, it is important to ensure that he does not require oxygen supplementation with his CPAP therapy ? ? ?

## 2021-05-17 NOTE — Patient Instructions (Addendum)
Schedule for in lab split-night study ?-We will update you as soon as study is done ? ? ?Continue weight loss efforts ? ?Tentative follow-up in 6 months ?

## 2021-08-02 ENCOUNTER — Ambulatory Visit (HOSPITAL_BASED_OUTPATIENT_CLINIC_OR_DEPARTMENT_OTHER): Payer: BC Managed Care – PPO | Attending: Pulmonary Disease | Admitting: Pulmonary Disease

## 2021-08-02 DIAGNOSIS — G4733 Obstructive sleep apnea (adult) (pediatric): Secondary | ICD-10-CM

## 2021-08-02 DIAGNOSIS — E669 Obesity, unspecified: Secondary | ICD-10-CM | POA: Diagnosis not present

## 2021-08-02 DIAGNOSIS — G4761 Periodic limb movement disorder: Secondary | ICD-10-CM | POA: Diagnosis not present

## 2021-08-02 DIAGNOSIS — I10 Essential (primary) hypertension: Secondary | ICD-10-CM | POA: Insufficient documentation

## 2021-08-02 DIAGNOSIS — I493 Ventricular premature depolarization: Secondary | ICD-10-CM | POA: Diagnosis not present

## 2021-08-14 ENCOUNTER — Telehealth: Payer: Self-pay | Admitting: Pulmonary Disease

## 2021-08-14 DIAGNOSIS — G4733 Obstructive sleep apnea (adult) (pediatric): Secondary | ICD-10-CM

## 2021-08-14 NOTE — Telephone Encounter (Signed)
Call patient  Sleep study result  Date of study: 08/02/2021  Impression: Severe obstructive sleep apnea with severe oxygen desaturations  Recommendation: DME referral  Recommend CPAP therapy for moderate obstructive sleep apnea  - Trial of CPAP therapy on 19 cm H2O with a Medium size Resmed Full Face AirFit F30i mask and heated humidification with 3 L of oxygen added to system  Encourage weight loss measures  Follow-up in the office 4 to 6 weeks following initiation of treatment

## 2021-08-14 NOTE — Telephone Encounter (Signed)
I gave him the results and he voices understanding. I called the patient and he does not want the oxygen added to the CPAP. Please advise if ok to place CPAP with no oxygen?

## 2021-08-14 NOTE — Procedures (Signed)
POLYSOMNOGRAPHY  Last, First: Aaron Frost, Aaron Frost MRN: [id number] Gender: Male Age (years): 89 Weight (lbs): 450 DOB: [birth year] BMI: 18 Primary Care: Inda Coke PA Epworth Score: 12 Referring: Laurin Coder MD Technician: Laren Everts Interpreting: Laurin Coder MD Study Type: Split Night CPAP Ordered Study Type: Split Night CPAP Study date: 08/02/2021 Location: [place] CLINICAL INFORMATION Aaron Frost is a 41 year old Male and was referred to the sleep center for evaluation of G47.33 OSA: Adult and Pediatric (327.23). Indications include Fatigue, Hypertension, Obesity, OSA, Snoring.  MEDICATIONS Patient self administered medications include: N/A. Medications administered during study include No sleep medicine administered.  SLEEP STUDY TECHNIQUE The patient underwent an attended overnight level one polysomnography titration to assess the effects of CPAP therapy. The following variables were monitored: EEG (C4-A1, C3-A2, O1-A2, O2-A1), EOG, submental and leg EMG, ECG, oxyhemoglobin saturation by pulse oximetry, thoracic and abdominal respiratory effort belts, nasal/oral airflow by pressure sensor, body position sensor and snoring sensor. CPAP pressure was titrated to eliminate apneas, hypopneas and oxygen desaturation. Hypopneas were scored per AASM definition IB (4% desaturation)  The NPSG portion of the study ended at 12:24:12 AM . The CPAP titration was initiated at 12:33:11 AM AM with the CPAP portion of the study ending at 5:30:12 AM.  TECHNICIAN COMMENTS Comments added by Technician: O2 initiated due to low sats. Patient talked in his/her sleep. Comments added by Scorer: N/A SLEEP ARCHITECTURE The recording time for the entire night was 453.1 minutes. The diagnostic portion was initiated at 9:57:07 PM and terminated at 12:24:12 AM. The time in bed was 147.1 minutes. EEG confirmed total sleep time was 127.5 minutes yielding a sleep efficiency of  86.7%. Sleep onset after lights out was 11.9 minutes with a REM latency of 128.5 minutes. The patient spent 9.80% of the night in stage N1 sleep, 85.88% in stage N2 sleep, 0.00% in stage N3 and 4.3% in REM. The Arousal Index was 111.5/hour.  The titration portion was initiated at 12:33:11 AM and terminated at 5:30:12 AM. The time in bed was 297.0 minutes. EEG confirmed total sleep time was 265.2 minutes yielding a sleep efficiency of 89.3%. Sleep onset after CPAP initiation was 20.3 minutes with a REM latency of 63.0 minutes. The patient spent 4.15% of the night in stage N1 sleep, 54.56% in stage N2 sleep, 0.75% in stage N3 and 40.5% in REM. The Arousal Index was 17.0/hour. RESPIRATORY PARAMETERS During the diagnostic portion, there were a total of 252 respiratory disturbances recorded; 181 apneas ( 121 obstructive, 56 mixed, 4 central), 64 hypopneas and 7 RERAs. The apnea/hypopnea index 115.3 was events/hour and the RDI was 118.6 events/hour. The central sleep apnea index was 1.9 events/hour. The REM AHI was 87.3 /h and NREM AHI was 117.0/h. The REM RDI was 87.3 /h and NREM RDI was 120.5 /h. The supine AHI was 110.9/h, and the non supine AHI was 120/h; supine during 51.36% of sleep. The supine RDI was 113.6/h, and the non supine RDI was 123.83/h. Respiratory disturbances were associated with oxygen desaturation down to a nadir of 51.00 % during sleep. The mean oxygen saturation during the study was 70.53%. The cumulative time under 88% oxygen saturation was 124.4 minutes.  During the titration portion, the apnea/hypopnea index (AHI) was 24.0 events/hour and the RDI was 24.2 events/hour. The central sleep apnea index was 1.4 events/hour. The most appropriate setting of CPAP was IPAP/EPAP 19/19 cm H2O. At this setting, the sleep efficiency was 99% and the patient was supine for 100%. The  AHI was 2.2 events per hour(with 0 central events). Oxygen nadir was 83.0. LEG MOVEMENT DATA The periodic limb movement  index was 26.13/hour with an associated arousal index of /hour. CARDIAC DATA The underlying cardiac rhythm was most consistent with sinus rhythm. Mean heart rate was 71.43 during diagnostic portion and 62.56 during titration portion of study. Additional rhythm abnormalities include PVCs.  IMPRESSIONS - Severe Obstructive Sleep apnea(OSA) Optimal pressure attained. - Electrocardiographic data showed presence of PVCs. - No Significant Central Sleep Apnea (CSA) - Severe Oxygen Desaturation - The patient snored with loud snoring volume. - EEG did not show alpha intrusion. - Moderate periodic leg movements(PLMs) during sleep. However, no significant associated arousals. - Normal sleep efficiency, normal primary sleep latency, long REM sleep latency and no slow wave latency.  DIAGNOSIS - Obstructive Sleep Apnea (G47.33) - severe oxygen desaturation's  RECOMMENDATIONS - Trial of CPAP therapy on 19 cm H2O with a Medium size Resmed Full Face AirFit F30i mask and heated humidification with 3 L of oxygen added to system - Avoid alcohol, sedatives and other CNS depressants that may worsen sleep apnea and disrupt normal sleep architecture. - Sleep hygiene should be reviewed to assess factors that may improve sleep quality. - Weight management and regular exercise should be initiated or continued. - Return to Sleep Center for re-evaluation after 4 weeks of therapy  [Electronically signed] 08/14/2021 05:41 AM  Sherrilyn Rist MD NPI: [id number]

## 2021-08-15 NOTE — Telephone Encounter (Signed)
Okay to order CPAP  Patient has to be aware that he stands a significant risk of harm to his heart and brain as CPAP therapy alone is not maintaining his oxygen levels.  Schedule for follow-up to discuss further

## 2021-08-16 ENCOUNTER — Encounter (INDEPENDENT_AMBULATORY_CARE_PROVIDER_SITE_OTHER): Payer: Self-pay

## 2021-08-16 NOTE — Telephone Encounter (Signed)
Attempted to call pt but unable to reach. Left message for him to return call. °

## 2021-08-18 NOTE — Telephone Encounter (Signed)
Patient is returning phone call. Patient phone number is [phone]. Would like to be called back on Monday 08/21/2021.

## 2021-08-18 NOTE — Telephone Encounter (Signed)
Noted. Will call pt Monday 8/14 at pt's request.

## 2021-08-21 NOTE — Telephone Encounter (Signed)
Called patient and went over recommendations from Dr Jenetta Downer is regards to not having the oxygen maintained while on cpap. Patient states is there any way that we can monitor him in regards to his oxygen levels without having the oxygen in place due to him possibly losing his job at the end of the month and losing insurance. He states he does not want to have to pay for the oxygen and equipment if he is going to losing his insurance.   Please advise Dr Jenetta Downer

## 2021-08-23 ENCOUNTER — Telehealth: Payer: Self-pay | Admitting: Pulmonary Disease

## 2021-08-23 DIAGNOSIS — G4733 Obstructive sleep apnea (adult) (pediatric): Secondary | ICD-10-CM

## 2021-08-23 NOTE — Telephone Encounter (Signed)
Sir I placed the order for the ONO to be done just on cpap alone with no oxygen. But Huey Romans called me at the clinic and informed me that this patients cpap is broken and is wanting to know what you want to do moving forward so I can let [facility] know   Please advise   Thank you

## 2021-08-23 NOTE — Telephone Encounter (Signed)
Order placed for ONO on CPAP without oxygen being used to obtain his stats while on cpap alone. To see if patient can just wear cpap machine with no oxygen. Nothing further needed

## 2021-08-23 NOTE — Telephone Encounter (Signed)
We can repeat his overnight oximetry on CPAP without oxygen to ascertain.  The results we have so far suggests that he needs oxygen with CPAP.

## 2021-08-24 NOTE — Telephone Encounter (Signed)
Place order for CPAP pressure setting of 19

## 2021-08-24 NOTE — Telephone Encounter (Signed)
[facility] called yesterday to inform us that patient cpap machine was broken. So under the approval of Dr Jenetta Downer new cpap order placed for [facility]. After cpap is set up patient is to have ONO done on cpap alone to see if he can maintain his oxygen levels. Nothing further needed.

## 2021-10-02 ENCOUNTER — Encounter: Payer: Self-pay | Admitting: *Deleted

## 2021-12-21 ENCOUNTER — Encounter: Payer: Self-pay | Admitting: *Deleted

## 2021-12-27 ENCOUNTER — Encounter: Payer: Self-pay | Admitting: Physician Assistant

## 2021-12-27 ENCOUNTER — Ambulatory Visit (INDEPENDENT_AMBULATORY_CARE_PROVIDER_SITE_OTHER): Payer: BC Managed Care – PPO | Admitting: Physician Assistant

## 2021-12-27 VITALS — BP 124/80 | HR 63 | Temp 97.3°F | Ht 73.0 in | Wt >= 6400 oz

## 2021-12-27 DIAGNOSIS — Z23 Encounter for immunization: Secondary | ICD-10-CM

## 2021-12-27 DIAGNOSIS — G4733 Obstructive sleep apnea (adult) (pediatric): Secondary | ICD-10-CM

## 2021-12-27 DIAGNOSIS — I10 Essential (primary) hypertension: Secondary | ICD-10-CM

## 2021-12-27 DIAGNOSIS — R7303 Prediabetes: Secondary | ICD-10-CM | POA: Diagnosis not present

## 2021-12-27 DIAGNOSIS — Z0000 Encounter for general adult medical examination without abnormal findings: Secondary | ICD-10-CM

## 2021-12-27 DIAGNOSIS — Z1159 Encounter for screening for other viral diseases: Secondary | ICD-10-CM

## 2021-12-27 DIAGNOSIS — Z6841 Body Mass Index (BMI) 40.0 and over, adult: Secondary | ICD-10-CM

## 2021-12-27 LAB — CBC WITH DIFFERENTIAL/PLATELET
Basophils Absolute: 0.1 10*3/uL (ref 0.0–0.1)
Basophils Relative: 1.1 % (ref 0.0–3.0)
Eosinophils Absolute: 0.2 10*3/uL (ref 0.0–0.7)
Eosinophils Relative: 3.4 % (ref 0.0–5.0)
HCT: 47.6 % (ref 39.0–52.0)
Hemoglobin: 16.1 g/dL (ref 13.0–17.0)
Lymphocytes Relative: 25.8 % (ref 12.0–46.0)
Lymphs Abs: 1.7 10*3/uL (ref 0.7–4.0)
MCHC: 33.8 g/dL (ref 30.0–36.0)
MCV: 85.3 fl (ref 78.0–100.0)
Monocytes Absolute: 0.5 10*3/uL (ref 0.1–1.0)
Monocytes Relative: 7.6 % (ref 3.0–12.0)
Neutro Abs: 4.1 10*3/uL (ref 1.4–7.7)
Neutrophils Relative %: 62.1 % (ref 43.0–77.0)
Platelets: 234 10*3/uL (ref 150.0–400.0)
RBC: 5.58 Mil/uL (ref 4.22–5.81)
RDW: 14.7 % (ref 11.5–15.5)
WBC: 6.6 10*3/uL (ref 4.0–10.5)

## 2021-12-27 LAB — LIPID PANEL
Cholesterol: 184 mg/dL (ref 0–200)
HDL: 38.7 mg/dL — ABNORMAL LOW (ref >39.00)
LDL Cholesterol: 126 mg/dL — ABNORMAL HIGH (ref 0–99)
NonHDL: 145.28
Total CHOL/HDL Ratio: 5
Triglycerides: 94 mg/dL (ref 0.0–149.0)
VLDL: 18.8 mg/dL (ref 0.0–40.0)

## 2021-12-27 LAB — COMPREHENSIVE METABOLIC PANEL
ALT: 21 U/L (ref 0–53)
AST: 20 U/L (ref 0–37)
Albumin: 4.1 g/dL (ref 3.5–5.2)
Alkaline Phosphatase: 68 U/L (ref 39–117)
BUN: 13 mg/dL (ref 6–23)
CO2: 31 mEq/L (ref 19–32)
Calcium: 9.2 mg/dL (ref 8.4–10.5)
Chloride: 99 mEq/L (ref 96–112)
Creatinine, Ser: 0.88 mg/dL (ref 0.40–1.50)
GFR: 106.06 mL/min (ref >60.00)
Glucose, Bld: 107 mg/dL — ABNORMAL HIGH (ref 70–99)
Potassium: 4.5 mEq/L (ref 3.5–5.1)
Sodium: 137 mEq/L (ref 135–145)
Total Bilirubin: 0.8 mg/dL (ref 0.2–1.2)
Total Protein: 6.9 g/dL (ref 6.0–8.3)

## 2021-12-27 LAB — HEMOGLOBIN A1C: Hgb A1c MFr Bld: 6 % (ref 4.6–6.5)

## 2021-12-27 NOTE — Patient Instructions (Signed)
It was great to see you! ? ?Please go to the lab for blood work.  ? ?Our office will call you with your results unless you have chosen to receive results via MyChart. ? ?If your blood work is normal we will follow-up each year for physicals and as scheduled for chronic medical problems. ? ?If anything is abnormal we will treat accordingly and get you in for a follow-up. ? ?Take care, ? ?Vallerie Hentz ?  ? ? ?

## 2021-12-27 NOTE — Addendum Note (Signed)
Addended by: Marian Sorrow on: 12/27/2021 09:31 AM   Modules accepted: Orders

## 2021-12-27 NOTE — Progress Notes (Signed)
Subjective:    Aaron Frost is a 40 y.o. male and is here for a comprehensive physical exam.  HPI  Health Maintenance Due  Topic Date Due   Hepatitis C Screening  Never done   DTaP/Tdap/Td (2 - Td or Tdap) 03/15/2021    Acute Concerns: None  Chronic Issues: Hypertension Currently taking Diovan 40 mg daily. At home blood pressure readings are: not checked often. Patient denies chest pain, SOB, blurred vision, dizziness, unusual headaches, lower leg swelling. Denies excessive caffeine intake, stimulant usage, excessive alcohol intake, or increase in salt consumption. BP Readings from Last 5 Encounters:  12/27/21 124/80  05/17/21 120/80  12/26/20 (!) 144/90  12/25/19 134/84  02/16/19 (!) 141/88     Obstructive Sleep Apnea Patient is currently compliant with using CPAP nightly. He received a new mask which has helped him. However, in order to receive new equipment he had to repeat a sleep study -- sees Dr Ander Slade with pulm. Pt checks his oxygen at night and it has been stable.   Obesity/Pre-diabetes He is frustrated with his weight. He would not like to use any medications for weight loss. He has a gym nearby but is not in a place where he can afford it currently. He has plans to start exercising. Lab Results  Component Value Date   HGBA1C 6.3 12/26/2020    Wt Readings from Last 5 Encounters:  12/27/21 (!) 460 lb (208.7 kg)  08/02/21 (!) 450 lb (204.1 kg)  05/17/21 (!) 459 lb (208.2 kg)  12/26/20 (!) 457 lb 8 oz (207.5 kg)  12/25/19 (!) 446 lb (202.3 kg)     Health Maintenance: Immunizations -- Received Tdap today- UTD Colonoscopy -- N/A PSA --  Lab Results  Component Value Date   PSA 0.52 12/25/2019  Pt's father is being followed for concern of prostate cancer but no confirmed diagnosis.  Diet -- Has cut back on carbs, not eating much leafy greens due to concern for pesticide exposures Sleep habits -- Sleeping okay with new CPAP mask Exercise --  No exercising bu considering starting  Weight --  Recent weight history Wt Readings from Last 10 Encounters:  12/27/21 (!) 460 lb (208.7 kg)  08/02/21 (!) 450 lb (204.1 kg)  05/17/21 (!) 459 lb (208.2 kg)  12/26/20 (!) 457 lb 8 oz (207.5 kg)  12/25/19 (!) 446 lb (202.3 kg)  05/28/19 (!) 451 lb (204.6 kg)  02/16/19 (!) 451 lb 12.8 oz (204.9 kg)  10/08/18 (!) 428 lb 9.6 oz (194.4 kg)  07/21/18 (!) 407 lb (184.6 kg)  07/04/18 (!) 407 lb (184.6 kg)   Body mass index is 60.69 kg/m.  Mood -- He reports some stress with the holidays and work but nothing bad Alcohol use --  reports current alcohol use.  Tobacco use --  Tobacco Use: Medium Risk (12/27/2021)   Patient History    Smoking Tobacco Use: Former    Smokeless Tobacco Use: Never    Passive Exposure: Not on file    Eligible for Low Dose CT? no  UTD with eye doctor? Yes UTD with dentist? Yes     12/27/2021    8:38 AM  Depression screen PHQ 2/9  Decreased Interest 0  Down, Depressed, Hopeless 0  PHQ - 2 Score 0    Other providers/specialists: Patient Care Team: Inda Coke, [place] as PCP - General (Physician Assistant) Elouise Munroe, MD as PCP - Cardiology (Cardiology)    PMHx, SurgHx, SocialHx, Medications, and Allergies were reviewed in  the Visit Navigator and updated as appropriate.   Past Medical History:  Diagnosis Date   Asthma 08/09/2017   OSA on CPAP    Umbilical hernia 3299     Past Surgical History:  Procedure Laterality Date   EXTERNAL EAR SURGERY     right ear reconstruction   MOLE REMOVAL     [place] of Neck   TONSILLECTOMY  2005     Family History  Problem Relation Age of Onset   Hypertension Mother        grandfather/uncles   Heart disease Father    Cancer Sister        male   Diabetes Maternal Grandfather    Colon cancer Paternal Grandfather 76   Heart disease Paternal Grandfather    Stroke Paternal Grandfather    Breast cancer Neg Hx    Prostate cancer Neg Hx      Social History   Tobacco Use   Smoking status: Former    Packs/day: 1.50    Years: 3.00    Total pack years: 4.50    Types: Cigarettes    Quit date: 01/09/2004    Years since quitting: 17.9   Smokeless tobacco: Never  Substance Use Topics   Alcohol use: Yes    Comment: very rare glass of wine   Drug use: No    Review of Systems:   Review of Systems  Constitutional:  Negative for chills, fever, malaise/fatigue and weight loss.  HENT:  Negative for hearing loss, sinus pain and sore throat.   Eyes:  Positive for discharge (He suspects eye tearing is secondary to his CPAP).  Respiratory:  Negative for cough and hemoptysis.   Cardiovascular:  Negative for chest pain, palpitations, leg swelling and PND.  Gastrointestinal:  Negative for abdominal pain, blood in stool, constipation, diarrhea, heartburn, melena, nausea and vomiting.  Genitourinary:  Negative for dysuria, frequency and urgency.  Musculoskeletal:  Negative for back pain, myalgias and neck pain.  Skin:  Negative for itching and rash.  Neurological:  Negative for dizziness, tingling, seizures and headaches.  Endo/Heme/Allergies:  Negative for polydipsia.  Psychiatric/Behavioral:  Negative for depression. The patient is not nervous/anxious.     Objective:    Vitals:   12/27/21 0838  BP: 124/80  Pulse: 63  Temp: (!) 97.3 F (36.3 C)  SpO2: 97%    Body mass index is 60.69 kg/m.  General  Alert, cooperative, no distress, appears stated age  Head:  Normocephalic, without obvious abnormality, atraumatic  Eyes:  PERRL, conjunctiva/corneas clear, EOM's intact, fundi benign, both eyes       Ears:  Normal TM's and external ear canals, both ears  Nose: Nares normal, septum midline, mucosa normal, no drainage or sinus tenderness  Throat: Lips, mucosa, and tongue normal; teeth and gums normal  Neck: Supple, symmetrical, trachea midline, no adenopathy;     thyroid:  No enlargement/tenderness/nodules; no  carotid bruit or JVD  Back:   Symmetric, no curvature, ROM normal, no CVA tenderness  Lungs:   Clear to auscultation bilaterally, respirations unlabored  Chest wall:  No tenderness or deformity  Heart:  Regular rate and rhythm, S1 and S2 normal, no murmur, rub or gallop  Abdomen:   Soft, non-tender, bowel sounds active all four quadrants, no masses, no organomegaly  Extremities: Extremities normal, atraumatic, no cyanosis or edema  Prostate : Deferred   Skin: Skin color, texture, turgor normal, no rashes or lesions  Lymph nodes: Cervical, supraclavicular, and axillary nodes normal  Neurologic:  CNII-XII grossly intact. Normal strength, sensation and reflexes throughout   AssessmentPlan:   Routine physical examination Today patient counseled on age appropriate routine health concerns for screening and prevention, each reviewed and up to date or declined. Immunizations reviewed and up to date or declined. Labs ordered and reviewed. Risk factors for depression reviewed and negative. Hearing function and visual acuity are intact. ADLs screened and addressed as needed. Functional ability and level of safety reviewed and appropriate. Education, counseling and referrals performed based on assessed risks today. Patient provided with a copy of personalized plan for preventive services.  OSA on CPAP Overall compliant Follow-up as needed  Essential hypertension Well controlled Continue valsartan 40 mg daily Follow-up in 6 mo - 1 yr, sooner if concerns  Prediabetes; BMI 50.0-59.9, adult ([place]) Recheck A1c today and provide recommendations accordingly Declines GLP-1 RA  Encounter for screening for other viral diseases Update Hep C screening  I,Alexis Herring,acting as a scribe for [employer], PA.,have documented all relevant documentation on the behalf of Inda Coke, PA,as directed by  Inda Coke, PA while in the presence of Inda Coke, [place].  I, Inda Coke, [place], have  reviewed all documentation for this visit. The documentation on 12/27/21 for the exam, diagnosis, procedures, and orders are all accurate and complete.  Inda Coke, PA-C [place]

## 2021-12-28 ENCOUNTER — Encounter: Payer: Self-pay | Admitting: Physician Assistant

## 2021-12-28 DIAGNOSIS — E785 Hyperlipidemia, unspecified: Secondary | ICD-10-CM | POA: Insufficient documentation

## 2021-12-28 LAB — HEPATITIS C ANTIBODY: Hepatitis C Ab: NONREACTIVE

## 2022-01-02 ENCOUNTER — Encounter: Payer: BC Managed Care – PPO | Admitting: Physician Assistant

## 2022-01-03 ENCOUNTER — Ambulatory Visit
Admission: EM | Admit: 2022-01-03 | Discharge: 2022-01-03 | Disposition: A | Discharge status: Home / Self Care | Payer: BC Managed Care – PPO | Attending: Urgent Care | Admitting: Urgent Care

## 2022-01-03 DIAGNOSIS — K644 Residual hemorrhoidal skin tags: Secondary | ICD-10-CM | POA: Diagnosis not present

## 2022-01-03 MED ORDER — HYDROCORTISONE (PERIANAL) 2.5 % EX CREA: 1.0000 | TOPICAL_CREAM | Freq: Two times a day (BID) | CUTANEOUS | 0 refills | Status: DC

## 2022-01-03 NOTE — ED Triage Notes (Signed)
Pt. Presents to UC w/ c/o his hemorrhoids. Pt. States he had woken up two days ago to blood in the bed. Pt. Endorses pain and itching @ the site.

## 2022-01-03 NOTE — ED Provider Notes (Signed)
Roderic Palau    CSN: [id number] Arrival date & time: 01/03/22  1559      History   Chief Complaint No chief complaint on file.   HPI Aaron Frost is a 41 y.o. male.   HPI  Patient presents to urgent care with complaint of hemorrhoid.  Patient states he woke up 2 days ago with blood in his bed.  Endorses pain and itching at the site.  He reports history of internal and external hemorrhoids.  Past Medical History:  Diagnosis Date   Asthma 08/09/2017   OSA on CPAP    Umbilical hernia 8841    Patient Active Problem List   Diagnosis Date Noted   Hyperlipidemia 12/28/2021   Normocytic anemia 08/10/2017   Hyperglycemia 08/10/2017   Asthma 08/09/2017   BMI 50.0-59.9, adult ([place]) 04/05/2016   Hemorrhoids 04/05/2016   Essential hypertension 04/05/2016   Gastroesophageal reflux disease 04/05/2016   Ventral hernia 12/24/2012   Pain of left heel 11/24/2011   OSA on CPAP 10/08/2011   Leg swelling 10/07/2011   Radicular pain in left arm 10/07/2011   DEPRESSION 06/10/2007   Allergic rhinitis 06/10/2007    Past Surgical History:  Procedure Laterality Date   EXTERNAL EAR SURGERY     right ear reconstruction   MOLE REMOVAL     [place]   TONSILLECTOMY  2005       Home Medications    Prior to Admission medications   Medication Sig Start Date End Date Taking? Authorizing Provider  acetaminophen (TYLENOL) 500 MG tablet Take 500 mg by mouth every 6 (six) hours as needed for mild pain.     [provider]  loratadine (CLARITIN) 10 MG tablet Take 10 mg by mouth daily.    [provider]  Multiple Vitamin (MULTIVITAMIN WITH MINERALS) TABS tablet Take 1 tablet by mouth daily.    [provider]  omeprazole (PRILOSEC OTC) 20 MG tablet Take 20 mg by mouth daily as needed.     [provider]  valsartan (DIOVAN) 40 MG tablet Take 1 tablet (40 mg total) by mouth daily. 04/10/21   Inda Coke, PA  Zinc 30 MG CAPS Take 30 mg  by mouth.    [provider]  hydrochlorothiazide 25 MG tablet Take 1 tablet (25 mg total) by mouth daily. 07/14/10 03/16/11  Norins, Heinz Knuckles, MD    Family History Family History  Problem Relation Age of Onset   Hypertension Mother    Heart disease Father    Benign prostatic hyperplasia Father    Ovarian cancer Sister    Diabetes Maternal Grandfather    Colon cancer Paternal Grandfather 11   Heart disease Paternal Grandfather    Stroke Paternal Grandfather    Breast cancer Neg Hx     Social History Social History   Tobacco Use   Smoking status: Former    Packs/day: 1.50    Years: 3.00    Total pack years: 4.50    Types: Cigarettes    Quit date: 01/09/2004    Years since quitting: 17.9   Smokeless tobacco: Never  Substance Use Topics   Alcohol use: Yes    Comment: very rare glass of wine   Drug use: No     Allergies   Aleve [naproxen] and Nickel   Review of Systems Review of Systems   Physical Exam Triage Vital Signs ED Triage Vitals  Enc Vitals Group     BP      Pulse  Resp      Temp      Temp src      SpO2      Weight      Height      Head Circumference      Peak Flow      Pain Score      Pain Loc      Pain Edu?      Excl. in [place]?    No data found.  Updated Vital Signs There were no vitals taken for this visit.  Visual Acuity Right Eye Distance:   Left Eye Distance:   Bilateral Distance:    Right Eye Near:   Left Eye Near:    Bilateral Near:     Physical Exam Vitals reviewed.  Constitutional:      Appearance: Normal appearance. He is obese.  Genitourinary:    Rectum: External hemorrhoid present.  Musculoskeletal:        General: Normal range of motion.  Skin:    General: Skin is warm and dry.  Neurological:     General: No focal deficit present.     Mental Status: He is alert and oriented to person, place, and time.  Psychiatric:        Mood and Affect: Mood normal.        Behavior: Behavior normal.      UC  Treatments / Results  Labs (all labs ordered are listed, but only abnormal results are displayed) Labs Reviewed - No data to display  EKG   Radiology No results found.  Procedures Procedures (including critical care time)  Medications Ordered in UC Medications - No data to display  Initial Impression / Assessment and Plan / UC Course  I have reviewed the triage vital signs and the nursing notes.  Pertinent labs & imaging results that were available during my care of the patient were reviewed by me and considered in my medical decision making (see chart for details).   Internal hemorrhoid is present at his rectum.  No bleeding currently.  Appears thrombosed.  Anusol cream is provided to patient.  Recommending he have the hemorrhoid evaluated by GI surgeon versus gastroenterology.   Final Clinical Impressions(s) / UC Diagnoses   Final diagnoses:  None   Discharge Instructions   None    ED Prescriptions   None    PDMP not reviewed this encounter.   Rose Phi, [place] 01/03/22 1714

## 2022-01-03 NOTE — Discharge Instructions (Addendum)
Apply hydrocortisone cream to the affected area twice a day.  Follow-up with your primary care provider to request evaluation by general surgeon or gastroenterology to determine if definitive surgical intervention is necessary.

## 2022-01-10 ENCOUNTER — Encounter: Payer: Self-pay | Admitting: Physician Assistant

## 2022-01-10 ENCOUNTER — Ambulatory Visit (INDEPENDENT_AMBULATORY_CARE_PROVIDER_SITE_OTHER): Payer: BC Managed Care – PPO | Admitting: Physician Assistant

## 2022-01-10 VITALS — BP 130/80 | HR 73 | Temp 98.0°F | Ht 73.0 in | Wt >= 6400 oz

## 2022-01-10 DIAGNOSIS — K649 Unspecified hemorrhoids: Secondary | ICD-10-CM | POA: Diagnosis not present

## 2022-01-10 NOTE — Progress Notes (Signed)
Aaron Frost is a 42 y.o. male here for a new problem.  History of Present Illness:   Chief Complaint  Patient presents with   Hemorrhoids    Pt c/o hemorrhoids,opened on 12/26 early that morning. He went to Urgent Care on 12/27.    HPI  Hemorrhoid Reports bleeding from hemorrhoid on 12/26- noted waking up with small spots of blood on his bed sheets. He thinks 1tbsp blood at the most. Seen at urgent care on 01/03/22 for this problem. Given Rx for topical Anusol, has been using this since visit. He reports having hemorrhoids in the past but without bleeding. Typically has a hemorrhoid once a year or so. He denies any constipation leading up to this problem. He denies any associated pain- pain resolved as soon as the hemorrhoid opened.  Past Medical History:  Diagnosis Date   Asthma 08/09/2017   OSA on CPAP    Umbilical hernia 4431     Social History   Tobacco Use   Smoking status: Former    Packs/day: 1.50    Years: 3.00    Total pack years: 4.50    Types: Cigarettes    Quit date: 01/09/2004    Years since quitting: 18.0   Smokeless tobacco: Never  Substance Use Topics   Alcohol use: Yes    Comment: very rare glass of wine   Drug use: No    Past Surgical History:  Procedure Laterality Date   EXTERNAL EAR SURGERY     right ear reconstruction   MOLE REMOVAL     [place] of [place]   TONSILLECTOMY  2005    Family History  Problem Relation Age of Onset   Hypertension Mother    Heart disease Father    Benign prostatic hyperplasia Father    Ovarian cancer Sister    Diabetes Maternal Grandfather    Colon cancer Paternal Grandfather 75   Heart disease Paternal Grandfather    Stroke Paternal Grandfather    Breast cancer Neg Hx     Allergies  Allergen Reactions   Aleve [Naproxen] Other (See Comments)    Burning sensation and headaches   Nickel Rash    Current Medications:   Current Outpatient Medications:    acetaminophen (TYLENOL) 500 MG tablet, Take 500  mg by mouth every 6 (six) hours as needed for mild pain. , Disp: , Rfl:    hydrocortisone (ANUSOL-HC) 2.5 % rectal cream, Place 1 Application rectally 2 (two) times daily., Disp: 30 g, Rfl: 0   loratadine (CLARITIN) 10 MG tablet, Take 10 mg by mouth daily., Disp: , Rfl:    Multiple Vitamin (MULTIVITAMIN WITH MINERALS) TABS tablet, Take 1 tablet by mouth daily., Disp: , Rfl:    omeprazole (PRILOSEC OTC) 20 MG tablet, Take 20 mg by mouth daily as needed. , Disp: , Rfl:    valsartan (DIOVAN) 40 MG tablet, Take 1 tablet (40 mg total) by mouth daily., Disp: 90 tablet, Rfl: 3   Zinc 30 MG CAPS, Take 30 mg by mouth., Disp: , Rfl:    Review of Systems:   Review of Systems  Gastrointestinal:        + hemorrhoid     Vitals:   Vitals:   01/10/22 1502  BP: 130/80  Pulse: 73  Temp: 98 F (36.7 C)  TempSrc: Temporal  SpO2: 95%  Weight: (!) 471 lb (213.6 kg)  Height: '6\' 1"'$  (1.854 m)     Body mass index is 62.14 kg/m.  Physical Exam:  Physical Exam Vitals and nursing note reviewed. Exam conducted with a chaperone present.  Constitutional:      Appearance: He is well-developed.  HENT:     Head: Normocephalic.  Eyes:     Conjunctiva/sclera: Conjunctivae normal.     Pupils: Pupils are equal, round, and reactive to light.  Pulmonary:     Effort: Pulmonary effort is normal.  Genitourinary:    Comments: External hemorrhoid at 3 o clock Non thrombosed Musculoskeletal:        General: Normal range of motion.     Cervical back: Normal range of motion.  Skin:    General: Skin is warm and dry.  Neurological:     Mental Status: He is alert and oriented to person, place, and time.  Psychiatric:        Behavior: Behavior normal.        Thought Content: Thought content normal.        Judgment: Judgment normal.     Assessment and Plan:   Hemorrhoids, unspecified hemorrhoid type No red flags on exam Referral to GI per patient request Follow-up if new/worsening sx Recommend  constipation prevention  I,Alexis Herring,acting as a scribe for Inda Coke, PA.,have documented all relevant documentation on the behalf of Inda Coke, PA,as directed by  Inda Coke, PA while in the presence of Inda Coke, [place].  I, Inda Coke, [place], have reviewed all documentation for this visit. The documentation on 01/10/22 for the exam, diagnosis, procedures, and orders are all accurate and complete.  Inda Coke, PA-C

## 2022-04-03 ENCOUNTER — Encounter: Payer: Self-pay | Admitting: Family Medicine

## 2022-04-03 ENCOUNTER — Ambulatory Visit (INDEPENDENT_AMBULATORY_CARE_PROVIDER_SITE_OTHER): Payer: Self-pay | Admitting: Family Medicine

## 2022-04-03 ENCOUNTER — Ambulatory Visit: Payer: BC Managed Care – PPO | Admitting: Family Medicine

## 2022-04-03 VITALS — BP 126/70 | HR 87 | Temp 101.0°F | Ht 73.0 in | Wt >= 6400 oz

## 2022-04-03 DIAGNOSIS — R300 Dysuria: Secondary | ICD-10-CM | POA: Diagnosis not present

## 2022-04-03 DIAGNOSIS — N3000 Acute cystitis without hematuria: Secondary | ICD-10-CM

## 2022-04-03 DIAGNOSIS — N390 Urinary tract infection, site not specified: Secondary | ICD-10-CM | POA: Insufficient documentation

## 2022-04-03 LAB — POC URINALSYSI DIPSTICK (AUTOMATED)
Bilirubin, UA: NEGATIVE
Blood, UA: 80
Glucose, UA: NEGATIVE
Ketones, UA: NEGATIVE
Nitrite, UA: NEGATIVE
Protein, UA: POSITIVE — AB
Spec Grav, UA: 1.01 (ref 1.010–1.025)
Urobilinogen, UA: 0.2 E.U./dL
pH, UA: 7.5 (ref 5.0–8.0)

## 2022-04-03 MED ORDER — ONDANSETRON HCL 8 MG PO TABS: 8.0000 mg | ORAL_TABLET | Freq: Three times a day (TID) | ORAL | 0 refills | Status: DC | PRN

## 2022-04-03 MED ORDER — CEPHALEXIN 500 MG PO CAPS: 500.0000 mg | ORAL_CAPSULE | Freq: Three times a day (TID) | ORAL | 0 refills | Status: DC

## 2022-04-03 NOTE — Patient Instructions (Signed)
Drink lots of fluids / water if you can  Try the generic zofran for nausea  Take generic keflex for uti - three times daily - with food if you can   Take tylenol for fever   We will call with culture result   If your symptoms get severe - go to ER

## 2022-04-03 NOTE — Assessment & Plan Note (Signed)
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

## 2022-04-03 NOTE — Progress Notes (Signed)
Subjective:    Patient ID: Aaron Frost, male    DOB: December 02, 1980, 42 y.o.   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   Results for orders placed or performed in visit on 04/03/22  POCT Urinalysis Dipstick (Automated)  Result Value Ref Range   Color, UA Amber    Clarity, UA Cloudy    Glucose, UA Negative Negative   Bilirubin, UA Negative    Ketones, UA Negative    Spec Grav, UA 1.010 1.010 - 1.025   Blood, UA 80 Ery/uL    pH, UA 7.5 5.0 - 8.0   Protein, UA Positive (A) Negative   Urobilinogen, UA 0.2 0.2 or 1.0 E.U./dL   Nitrite, UA Negative    Leukocytes, UA Large (3+) (A) Negative    Patient Active Problem List   Diagnosis Date Noted   UTI (urinary tract infection) 04/03/2022   Hyperlipidemia 12/28/2021   Normocytic anemia 08/10/2017   Hyperglycemia 08/10/2017   Asthma 08/09/2017   BMI 50.0-59.9, adult ([place]) 04/05/2016   Hemorrhoids 04/05/2016   Essential hypertension 04/05/2016   Gastroesophageal reflux disease 04/05/2016   Ventral hernia 12/24/2012   Pain of left heel 11/24/2011   OSA on CPAP 10/08/2011   Leg swelling 10/07/2011   Radicular pain in left arm 10/07/2011   DEPRESSION 06/10/2007   Allergic rhinitis 06/10/2007   Past Medical History:  Diagnosis Date   Asthma 08/09/2017   OSA on CPAP    Umbilical hernia [id number]   Past Surgical History:  Procedure Laterality Date   EXTERNAL EAR  SURGERY     right ear reconstruction   MOLE REMOVAL     B[place] of [place]   TONSILLECTOMY  2005   Social History   Tobacco Use   Smoking status: Former    Packs/day: 1.50    Years: 3.00    Additional pack years: 0.00    Total pack years: 4.50    Types: Cigarettes    Quit date: 01/09/2004    Years since quitting: 18.2   Smokeless tobacco: Never  Substance Use Topics   Alcohol use: Yes    Comment: very rare glass of wine   Drug use: No   Family History  Problem Relation Age of Onset   Hypertension Mother    Heart disease Father    Benign prostatic hyperplasia Father    Ovarian cancer Sister    Diabetes Maternal Grandfather    Colon cancer Paternal Grandfather 25   Heart disease Paternal Grandfather    Stroke Paternal Grandfather    Breast cancer Neg Hx    Allergies  Allergen Reactions   Aleve [Naproxen] Other (See Comments)    Burning sensation and headaches   Nickel Rash   Current Outpatient Medications on File Prior to Visit  Medication Sig Dispense Refill   acetaminophen (TYLENOL) 500 MG tablet Take 500 mg by mouth every 6 (six) hours as needed for mild pain.      hydrocortisone (ANUSOL-HC) 2.5 % rectal cream Place 1 Application rectally 2 (two) times daily. 30 g 0   loratadine (CLARITIN) 10 MG tablet Take 10 mg by mouth daily.     Multiple Vitamin (MULTIVITAMIN WITH MINERALS) TABS tablet Take 1 tablet by mouth daily.     omeprazole (PRILOSEC OTC) 20 MG tablet Take 20 mg by mouth daily as needed.      valsartan (DIOVAN) 40 MG tablet Take 1 tablet (40 mg total) by mouth daily. 90 tablet 3   Zinc 30 MG CAPS Take 30 mg by mouth.     [DISCONTINUED] hydrochlorothiazide 25 MG tablet Take 1 tablet (25 mg total) by mouth daily. 30 tablet 11   No current facility-administered medications on file prior to visit.     Review of Systems  Constitutional:  Positive for fatigue and fever. Negative for activity change and appetite change.  HENT:  Negative for congestion and sore  throat.   Eyes:  Negative for itching and visual disturbance.  Respiratory:  Negative for cough and shortness of breath.   Cardiovascular:  Negative for leg swelling.  Gastrointestinal:  Positive for nausea. Negative for abdominal distention, abdominal pain, constipation, diarrhea and vomiting.  Endocrine: Negative for cold intolerance and polydipsia.  Genitourinary:  Positive for dysuria, frequency and urgency. Negative for decreased urine volume, difficulty urinating, enuresis, flank pain, hematuria, penile discharge, penile pain, penile swelling and testicular pain.  Musculoskeletal:  Negative for myalgias.  Skin:  Negative for rash.  Allergic/Immunologic: Negative for immunocompromised state.  Neurological:  Negative for dizziness and weakness.  Hematological:  Negative for adenopathy.       Objective:   Physical Exam Constitutional:      General: He is not in acute distress.    Appearance: Normal appearance. He is well-developed. He is obese. He is not ill-appearing or diaphoretic.  HENT:     Head: Normocephalic and atraumatic.  Eyes:     Conjunctiva/sclera: Conjunctivae normal.     Pupils: Pupils are equal, round, and reactive to light.  Cardiovascular:     Rate and Rhythm: Normal rate and regular rhythm.     Heart sounds: Normal heart sounds.  Pulmonary:     Effort: Pulmonary effort is normal.     Breath sounds: Normal breath sounds.  Abdominal:     General: Bowel sounds are normal. There is no distension.     Palpations: Abdomen is soft.     Tenderness: There is abdominal tenderness. There is no rebound.     Comments: No cva tenderness  Mild suprapubic pressure feeling on palp but not tender Unable to appreciate a distended bladder due to size of pannus   Musculoskeletal:     Cervical back: Normal range of motion and neck supple.  Lymphadenopathy:     Cervical: No cervical adenopathy.  Skin:    Findings: No rash.  Neurological:     Mental Status: He is alert.   Psychiatric:        Mood and Affect: Mood normal.           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

## 2022-04-05 LAB — URINE CULTURE
MICRO NUMBER:: 14743068
SPECIMEN QUALITY:: ADEQUATE

## 2022-04-09 NOTE — Progress Notes (Signed)
Aaron Frost is a 42 y.o. male here for a new problem.  History of Present Illness:   Chief Complaint  Patient presents with   Pruritis    Pt c/o itching genital area, penis and groin area. Pt is currently taking Cephalexin took last pill at lunch. He is not sure if having a reaction to antibiotic. Pt is still c/o burning with urination and frequency.    HPI  Rash He complains of itching in his genital area, penis, and groin area.  He has been sweating a lot but has tried to keep his genital areas dry.  Using jock itch spray to keep his genital areas dry but notes it has not helped.   Dysuria Patient was seen by Dr Milinda Antis on 04/03/22 for UTI.  Had positive UA with culture positive for E Coli Was prescribed Cephalexin Has taken this as prescribed He is unsure if his antibiotic attributed to his rash symptoms.  Has had 3 prior UTIs, once as a kid.  He is still having some urinary symptoms include burning with urination and frequency.    Past Medical History:  Diagnosis Date   Asthma 08/09/2017   OSA on CPAP    Umbilical hernia 2016     Social History   Tobacco Use   Smoking status: Former    Packs/day: 1.50    Years: 3.00    Additional pack years: 0.00    Total pack years: 4.50    Types: Cigarettes    Quit date: 01/09/2004    Years since quitting: 18.2   Smokeless tobacco: Never  Substance Use Topics   Alcohol use: Yes    Comment: very rare glass of wine   Drug use: No    Past Surgical History:  Procedure Laterality Date   EXTERNAL EAR SURGERY     right ear reconstruction   MOLE REMOVAL     [place] of [place]   TONSILLECTOMY  2005    Family History  Problem Relation Age of Onset   Hypertension Mother    Heart disease Father    Benign prostatic hyperplasia Father    Ovarian cancer Sister    Diabetes Maternal Grandfather    Colon cancer Paternal Grandfather 79   Heart disease Paternal Grandfather    Stroke Paternal Grandfather    Breast cancer Neg Hx      Allergies  Allergen Reactions   Aleve [Naproxen] Other (See Comments)    Burning sensation and headaches   Nickel Rash    Current Medications:   Current Outpatient Medications:    acetaminophen (TYLENOL) 500 MG tablet, Take 500 mg by mouth every 6 (six) hours as needed for mild pain. , Disp: , Rfl:    cephALEXin (KEFLEX) 500 MG capsule, Take 1 capsule (500 mg total) by mouth 3 (three) times daily., Disp: 21 capsule, Rfl: 0   fluconazole (DIFLUCAN) 150 MG tablet, Take 1 tablet today, May repeat dose q 3-5 days, Disp: 3 tablet, Rfl: 0   hydrocortisone (ANUSOL-HC) 2.5 % rectal cream, Place 1 Application rectally 2 (two) times daily., Disp: 30 g, Rfl: 0   ketoconazole (NIZORAL) 2 % cream, Apply to area 1-2 times daily, Disp: 15 g, Rfl: 0   loratadine (CLARITIN) 10 MG tablet, Take 10 mg by mouth daily., Disp: , Rfl:    Multiple Vitamin (MULTIVITAMIN WITH MINERALS) TABS tablet, Take 1 tablet by mouth daily., Disp: , Rfl:    omeprazole (PRILOSEC OTC) 20 MG tablet, Take 20 mg by mouth daily as  needed. , Disp: , Rfl:    ondansetron (ZOFRAN) 8 MG tablet, Take 1 tablet (8 mg total) by mouth every 8 (eight) hours as needed for nausea or vomiting., Disp: 20 tablet, Rfl: 0   valsartan (DIOVAN) 40 MG tablet, Take 1 tablet (40 mg total) by mouth daily., Disp: 90 tablet, Rfl: 3   Zinc 30 MG CAPS, Take 30 mg by mouth., Disp: , Rfl:    Review of Systems:   Review of Systems  Constitutional:  Positive for chills, diaphoresis and fever.  Genitourinary:  Positive for dysuria (burning) and frequency.  Skin:  Positive for itching (genital area, groin area, and penis).    Vitals:   Vitals:   04/10/22 1513  BP: (!) 138/90  Pulse: 67  Temp: 97.7 F (36.5 C)  TempSrc: Temporal  SpO2: 95%  Weight: (!) 474 lb (215 kg)  Height: 6\' 1"  (1.854 m)     Body mass index is 62.54 kg/m.  Physical Exam:   Physical Exam Vitals and nursing note reviewed.  Constitutional:      General: He is not in  acute distress.    Appearance: He is well-developed. He is not ill-appearing or toxic-appearing.  Cardiovascular:     Rate and Rhythm: Normal rate and regular rhythm.     Pulses: Normal pulses.     Heart sounds: Normal heart sounds, S1 normal and S2 normal.  Pulmonary:     Effort: Pulmonary effort is normal.     Breath sounds: Normal breath sounds.  Skin:    General: Skin is warm and dry.     Comments: Erythematous macerated skin to inner groin  Neurological:     Mental Status: He is alert.     GCS: GCS eye subscore is 4. GCS verbal subscore is 5. GCS motor subscore is 6.  Psychiatric:        Speech: Speech normal.        Behavior: Behavior normal. Behavior is cooperative.    Results for orders placed or performed in visit on 04/10/22  POCT urinalysis dipstick  Result Value Ref Range   Color, UA yellow    Clarity, UA clear    Glucose, UA Negative Negative   Bilirubin, UA Negative    Ketones, UA Negative    Spec Grav, UA 1.015 1.010 - 1.025   Blood, UA Negative    pH, UA 7.0 5.0 - 8.0   Protein, UA Negative Negative   Urobilinogen, UA 0.2 0.2 or 1.0 E.U./dL   Nitrite, UA Negative    Leukocytes, UA Negative Negative   Appearance     Odor      Assessment and Plan:   Dysuria UA negative Push fluids Will await culture and add on treatment accordingly Follow-up based on sx  Rash and nonspecific skin eruption Suspect possible intertrigo Treat with diflucan and nizoral If lack of improvement, needs close follow-up, consider derm   I,Rachel Rivera,acting as a scribe for [employer], PA.,have documented all relevant documentation on the behalf of Jarold Motto, PA,as directed by  Jarold Motto, PA while in the presence of Jarold Motto, [place].  I, Jarold Motto, [place], have reviewed all documentation for this visit. The documentation on 04/10/22 for the exam, diagnosis, procedures, and orders are all accurate and complete.  Jarold Motto, PA-C

## 2022-04-10 ENCOUNTER — Encounter: Payer: Self-pay | Admitting: Physician Assistant

## 2022-04-10 ENCOUNTER — Ambulatory Visit (INDEPENDENT_AMBULATORY_CARE_PROVIDER_SITE_OTHER): Payer: Self-pay | Admitting: Physician Assistant

## 2022-04-10 VITALS — BP 138/90 | HR 67 | Temp 97.7°F | Ht 73.0 in | Wt >= 6400 oz

## 2022-04-10 DIAGNOSIS — R300 Dysuria: Secondary | ICD-10-CM | POA: Diagnosis not present

## 2022-04-10 DIAGNOSIS — R21 Rash and other nonspecific skin eruption: Secondary | ICD-10-CM

## 2022-04-10 LAB — POCT URINALYSIS DIPSTICK
Bilirubin, UA: NEGATIVE
Blood, UA: NEGATIVE
Glucose, UA: NEGATIVE
Ketones, UA: NEGATIVE
Leukocytes, UA: NEGATIVE
Nitrite, UA: NEGATIVE
Protein, UA: NEGATIVE
Spec Grav, UA: 1.015 (ref 1.010–1.025)
Urobilinogen, UA: 0.2 E.U./dL
pH, UA: 7 (ref 5.0–8.0)

## 2022-04-10 MED ORDER — FLUCONAZOLE 150 MG PO TABS: ORAL_TABLET | ORAL | 0 refills | Status: DC

## 2022-04-10 MED ORDER — KETOCONAZOLE 2 % EX CREA: TOPICAL_CREAM | CUTANEOUS | 0 refills | Status: DC

## 2022-04-10 NOTE — Patient Instructions (Signed)
It was great to see you!  I will be in touch with your urine culture results  Start oral diflucan for yeast  Use topical ketoconazole in the area  After area has cleared up, consider using desitin (zinc oxide) as a barrier cream to keep area from getting irritated again  Keep [place] posted  Take care,  Inda Coke PA-C

## 2022-04-11 LAB — URINE CULTURE
MICRO NUMBER:: 14771951
Result:: NO GROWTH
SPECIMEN QUALITY:: ADEQUATE

## 2022-04-27 ENCOUNTER — Encounter: Payer: Self-pay | Admitting: Family

## 2022-04-27 ENCOUNTER — Ambulatory Visit (INDEPENDENT_AMBULATORY_CARE_PROVIDER_SITE_OTHER): Payer: Self-pay | Admitting: Family

## 2022-04-27 VITALS — BP 141/97 | HR 67 | Temp 97.8°F | Ht 73.0 in | Wt >= 6400 oz

## 2022-04-27 DIAGNOSIS — N3090 Cystitis, unspecified without hematuria: Secondary | ICD-10-CM

## 2022-04-27 DIAGNOSIS — I10 Essential (primary) hypertension: Secondary | ICD-10-CM

## 2022-04-27 LAB — POCT URINALYSIS DIPSTICK
Bilirubin, UA: NEGATIVE
Blood, UA: NEGATIVE
Glucose, UA: NEGATIVE
Ketones, UA: NEGATIVE
Nitrite, UA: NEGATIVE
Protein, UA: NEGATIVE
Spec Grav, UA: 1.02 (ref 1.010–1.025)
Urobilinogen, UA: 0.2 E.U./dL
pH, UA: 6 (ref 5.0–8.0)

## 2022-04-27 MED ORDER — NITROFURANTOIN MONOHYD MACRO 100 MG PO CAPS
100.0000 mg | ORAL_CAPSULE | Freq: Two times a day (BID) | ORAL | 0 refills | Status: DC
Start: 2022-04-27 — End: 2022-05-17

## 2022-04-27 NOTE — Progress Notes (Signed)
Patient ID: Aaron Frost, male    DOB: 08/11/1980, 42 y.o.   MRN: [id number]  Chief Complaint  Patient presents with   Dysuria    sx for 2w    HPI:      Urinary sx:  Pt c/o dysuria and irritation in penile area since 4/2, All other sx went away. Pt states he has changed his soaps and detergents. Pt had a UTI 3w ago and sx were better, but developed bad itching and had a yeast infection. Better since then. Denies fever, foul odor, no hematuria. no back pain. Reports mild cloudiness and darker urine.   Assessment & Plan:  1. Cystitis sending Macrobid this time, advised on use & SE,  last culture E. Coli, will send out for culture again. Pt states he still has Diflucan left over from last time and can take if has any yeast sx with this abt. Advised on good daily genital hygiene, using hypoallergenic soaps/body wash.  - POCT Urinalysis Dipstick - nitrofurantoin, macrocrystal-monohydrate, (MACROBID) 100 MG capsule; Take 1 capsule (100 mg total) by mouth 2 (two) times daily.  Dispense: 14 capsule; Refill: 0 - Urine Culture  2. Elevated blood pressure reading in office with diagnosis of hypertension pt thinks higher d/t current sx and feeling stress re his overall health, he is on low dose Valsartan, advised ok to take another dose today, eat low sodium diet, increase water intake, will continue to monitor.   Subjective:    Outpatient Medications Prior to Visit  Medication Sig Dispense Refill   acetaminophen (TYLENOL) 500 MG tablet Take 500 mg by mouth every 6 (six) hours as needed for mild pain.      hydrocortisone (ANUSOL-HC) 2.5 % rectal cream Place 1 Application rectally 2 (two) times daily. 30 g 0   ketoconazole (NIZORAL) 2 % cream Apply to area 1-2 times daily 15 g 0   loratadine (CLARITIN) 10 MG tablet Take 10 mg by mouth daily.     Multiple Vitamin (MULTIVITAMIN WITH MINERALS) TABS tablet Take 1 tablet by mouth daily.     omeprazole (PRILOSEC OTC) 20 MG tablet Take 20 mg by  mouth daily as needed.      ondansetron (ZOFRAN) 8 MG tablet Take 1 tablet (8 mg total) by mouth every 8 (eight) hours as needed for nausea or vomiting. 20 tablet 0   valsartan (DIOVAN) 40 MG tablet Take 1 tablet (40 mg total) by mouth daily. 90 tablet 3   Zinc 30 MG CAPS Take 30 mg by mouth.     fluconazole (DIFLUCAN) 150 MG tablet Take 1 tablet today, May repeat dose q 3-5 days (Patient not taking: Reported on 04/27/2022) 3 tablet 0   cephALEXin (KEFLEX) 500 MG capsule Take 1 capsule (500 mg total) by mouth 3 (three) times daily. (Patient not taking: Reported on 04/27/2022) 21 capsule 0   No facility-administered medications prior to visit.   Past Medical History:  Diagnosis Date   Asthma 08/09/2017   OSA on CPAP    Umbilical hernia 2016   Past Surgical History:  Procedure Laterality Date   EXTERNAL EAR SURGERY     right ear reconstruction   MOLE REMOVAL     Baco of Neck   TONSILLECTOMY  2005   Allergies  Allergen Reactions   Aleve [Naproxen] Other (See Comments)    Burning sensation and headaches   Nickel Rash      Objective:    Physical Exam Vitals and nursing note reviewed.  Constitutional:  General: He is not in acute distress.    Appearance: Normal appearance. He is obese.  HENT:     Head: Normocephalic.  Cardiovascular:     Rate and Rhythm: Normal rate and regular rhythm.  Pulmonary:     Effort: Pulmonary effort is normal.     Breath sounds: Normal breath sounds.  Musculoskeletal:        General: Normal range of motion.     Cervical back: Normal range of motion.  Skin:    General: Skin is warm and dry.  Neurological:     Mental Status: He is alert and oriented to person, place, and time.  Psychiatric:        Mood and Affect: Mood normal.    BP (!) 141/97 (BP Location: Left Arm, Patient Position: Sitting, Cuff Size: Large)   Pulse 67   Temp 97.8 F (36.6 C) (Temporal)   Ht  (1.854 m)   Wt (!) 465 lb 2 oz (211 kg)   SpO2 92%   BMI 61.37 kg/m   Wt Readings from Last 3 Encounters:  04/27/22 (!) 465 lb 2 oz (211 kg)  04/10/22 (!) 474 lb (215 kg)  04/03/22 (!) 469 lb 4 oz (212.9 kg)      Dulce Sellar, NP

## 2022-05-17 ENCOUNTER — Encounter: Payer: Self-pay | Admitting: Physician Assistant

## 2022-05-17 ENCOUNTER — Ambulatory Visit (INDEPENDENT_AMBULATORY_CARE_PROVIDER_SITE_OTHER): Payer: BC Managed Care – PPO | Admitting: Physician Assistant

## 2022-05-17 VITALS — BP 140/108 | HR 75 | Temp 97.8°F | Ht 73.0 in | Wt >= 6400 oz

## 2022-05-17 DIAGNOSIS — G4733 Obstructive sleep apnea (adult) (pediatric): Secondary | ICD-10-CM | POA: Diagnosis not present

## 2022-05-17 DIAGNOSIS — I10 Essential (primary) hypertension: Secondary | ICD-10-CM | POA: Diagnosis not present

## 2022-05-17 MED ORDER — VALSARTAN-HYDROCHLOROTHIAZIDE 80-12.5 MG PO TABS: 1.0000 | ORAL_TABLET | Freq: Every day | ORAL | 1 refills | Status: DC

## 2022-05-17 NOTE — Patient Instructions (Addendum)
It was great to see you!  Start valsartan-hydrochlorothiazide 80 - 12.5 mg daily tomorrow  I will also re-connect you with your cardiologist Dr Jacques Navy  I have placed new referral for pulmonary  Follow-up if new/worsening symptoms  Take care,  Jarold Motto PA-C

## 2022-05-17 NOTE — Progress Notes (Signed)
Aaron Frost is a 42 y.o. male here for a follow up of a pre-existing problem.  History of Present Illness:   Chief Complaint  Patient presents with   Hypertension    Pt c/o high bp readings tues it was 156/100,  bp this morning was 118/70 before getting out of bed. He has only eaten a cereal bar since yesterday afternoon. Has been taking 2 valsartan.    HPI  Hypertension:  He complains of elevated blood pressure and pressure headaches since Tuesday.  He occasionally checks his blood pressure at home.  Denies chest pain, shortness of breath, dizziness.  On Tuesday his blood pressure was 156/100 and this morning was 118/70 before getting out of bed.  He is compliant with 80 mg valsartan every morning and tends to take it in the mornings when he gets to work.   Has not been sleeping well lately and has had espressos.  He wakes up in the middle of the night and has trouble going back to sleep.  He has been compliant with his CPAP machine.   He is interested in possibly getting a new CPAP machine and seeing pulmonologist closer to his home. He last followed up with cardiology in 2021.  Past Medical History:  Diagnosis Date   Asthma 08/09/2017   OSA on CPAP    Umbilical hernia 2016     Social History   Tobacco Use   Smoking status: Former    Packs/day: 1.50    Years: 3.00    Additional pack years: 0.00    Total pack years: 4.50    Types: Cigarettes    Quit date: 01/09/2004    Years since quitting: 18.3   Smokeless tobacco: Never  Substance Use Topics   Alcohol use: Yes    Comment: very rare glass of wine   Drug use: No    Past Surgical History:  Procedure Laterality Date   EXTERNAL EAR SURGERY     right ear reconstruction   MOLE REMOVAL     Baco of Neck   TONSILLECTOMY  2005    Family History  Problem Relation Age of Onset   Hypertension Mother    Heart disease Father    Benign prostatic hyperplasia Father    Ovarian cancer Sister    Diabetes Maternal  Grandfather    Colon cancer Paternal Grandfather 72   Heart disease Paternal Grandfather    Stroke Paternal Grandfather    Breast cancer Neg Hx     Allergies  Allergen Reactions   Aleve [Naproxen] Other (See Comments)    Burning sensation and headaches   Nickel Rash    Current Medications:   Current Outpatient Medications:    acetaminophen (TYLENOL) 500 MG tablet, Take 500 mg by mouth every 6 (six) hours as needed for mild pain. , Disp: , Rfl:    hydrocortisone (ANUSOL-HC) 2.5 % rectal cream, Place 1 Application rectally 2 (two) times daily., Disp: 30 g, Rfl: 0   ketoconazole (NIZORAL) 2 % cream, Apply to area 1-2 times daily, Disp: 15 g, Rfl: 0   loratadine (CLARITIN) 10 MG tablet, Take 10 mg by mouth daily., Disp: , Rfl:    Multiple Vitamin (MULTIVITAMIN WITH MINERALS) TABS tablet, Take 1 tablet by mouth daily., Disp: , Rfl:    omeprazole (PRILOSEC OTC) 20 MG tablet, Take 20 mg by mouth daily as needed. , Disp: , Rfl:    valsartan-hydrochlorothiazide (DIOVAN-HCT) 80-12.5 MG tablet, Take 1 tablet by mouth daily., Disp: 30 tablet, Rfl:  1   Zinc 30 MG CAPS, Take 30 mg by mouth., Disp: , Rfl:    Review of Systems:   ROS Negative unless otherwise specified per HPI.  Vitals:   Vitals:   05/17/22 0838  BP: (!) 140/90  Pulse: 75  Temp: 97.8 F (36.6 C)  SpO2: 95%  Weight: (!) 462 lb 12.8 oz (209.9 kg)  Height: 6\' 1"  (1.854 m)     Body mass index is 61.06 kg/m.  Physical Exam:   Physical Exam Vitals and nursing note reviewed.  Constitutional:      General: He is not in acute distress.    Appearance: He is well-developed. He is not ill-appearing or toxic-appearing.  Cardiovascular:     Rate and Rhythm: Normal rate and regular rhythm.     Pulses: Normal pulses.     Heart sounds: Normal heart sounds, S1 normal and S2 normal.  Pulmonary:     Effort: Pulmonary effort is normal.     Breath sounds: Normal breath sounds.  Skin:    General: Skin is warm and dry.   Neurological:     Mental Status: He is alert.     GCS: GCS eye subscore is 4. GCS verbal subscore is 5. GCS motor subscore is 6.  Psychiatric:        Speech: Speech normal.        Behavior: Behavior normal. Behavior is cooperative.     Assessment and Plan:   Essential hypertension Above goal Increase medication valsartan-hydrochloroTHIAZIDE 80-12.5 mg daily Referral back to cardiology Follow-up with [place] in interim if new/worsening issues No red flags or evidence of end organ damage  OSA on CPAP Referral to new pulmonology doctor closer to his home   I,Rachel Rivera,acting as a scribe for Jarold Motto, PA.,have documented all relevant documentation on the behalf of Jarold Motto, PA,as directed by  Jarold Motto, PA while in the presence of Jarold Motto, [place].  I, Jarold Motto, [place], have reviewed all documentation for this visit. The documentation on 05/17/22 for the exam, diagnosis, procedures, and orders are all accurate and complete.   Jarold Motto, PA-C

## 2022-05-24 ENCOUNTER — Encounter: Payer: Self-pay | Admitting: Emergency Medicine

## 2022-05-24 ENCOUNTER — Other Ambulatory Visit: Payer: Self-pay

## 2022-05-24 ENCOUNTER — Emergency Department
Admission: EM | Admit: 2022-05-24 | Discharge: 2022-05-24 | Disposition: A | Discharge status: Home / Self Care | Payer: BC Managed Care – PPO | Attending: Emergency Medicine | Admitting: Emergency Medicine

## 2022-05-24 DIAGNOSIS — R519 Headache, unspecified: Secondary | ICD-10-CM | POA: Diagnosis not present

## 2022-05-24 DIAGNOSIS — I10 Essential (primary) hypertension: Secondary | ICD-10-CM | POA: Diagnosis not present

## 2022-05-24 DIAGNOSIS — M545 Low back pain, unspecified: Secondary | ICD-10-CM

## 2022-05-24 DIAGNOSIS — J45909 Unspecified asthma, uncomplicated: Secondary | ICD-10-CM | POA: Insufficient documentation

## 2022-05-24 LAB — CBC
HCT: 48.2 % (ref 39.0–52.0)
Hemoglobin: 15.6 g/dL (ref 13.0–17.0)
MCH: 27.5 pg (ref 26.0–34.0)
MCHC: 32.4 g/dL (ref 30.0–36.0)
MCV: 84.9 fL (ref 80.0–100.0)
Platelets: 246 10*3/uL (ref 150–400)
RBC: 5.68 MIL/uL (ref 4.22–5.81)
RDW: 13.4 % (ref 11.5–15.5)
WBC: 5.9 10*3/uL (ref 4.0–10.5)
nRBC: 0 % (ref 0.0–0.2)

## 2022-05-24 LAB — COMPREHENSIVE METABOLIC PANEL
ALT: 34 U/L (ref 0–44)
AST: 30 U/L (ref 15–41)
Albumin: 3.9 g/dL (ref 3.5–5.0)
Alkaline Phosphatase: 56 U/L (ref 38–126)
Anion gap: 9 (ref 5–15)
BUN: 13 mg/dL (ref 6–20)
CO2: 27 mmol/L (ref 22–32)
Calcium: 8.6 mg/dL — ABNORMAL LOW (ref 8.9–10.3)
Chloride: 99 mmol/L (ref 98–111)
Creatinine, Ser: 0.82 mg/dL (ref 0.61–1.24)
GFR, Estimated: >60 mL/min (ref >60)
Glucose, Bld: 132 mg/dL — ABNORMAL HIGH (ref 70–99)
Potassium: 3.5 mmol/L (ref 3.5–5.1)
Sodium: 135 mmol/L (ref 135–145)
Total Bilirubin: 1.1 mg/dL (ref 0.3–1.2)
Total Protein: 7 g/dL (ref 6.5–8.1)

## 2022-05-24 LAB — TROPONIN I (HIGH SENSITIVITY): Troponin I (High Sensitivity): 6 ng/L (ref <18)

## 2022-05-24 NOTE — ED Triage Notes (Signed)
States blood pressure ahs been running high for about 10 days.  States BP medicines adjusted to 80 mg Valsaarten and 15 mg HCTZ one week ago.  States having trouble sleeping and feeling pressure in the head.   AAOx3.  Skin  warm and dry. NAD

## 2022-05-24 NOTE — ED Provider Notes (Signed)
[facility] Provider Note    Event Date/Time   First MD Initiated Contact with Patient 05/24/22 0732     (approximate)  History   Chief Complaint: High blood pressure  HPI  Aaron Frost is a 42 y.o. male with a past medical history of asthma, hypertension, obesity, presents to the emergency department for high blood pressure.  According to the patient his blood pressure has been running high recently, they have been working with his PCP for blood pressure management.  Recently increase his valsartan hand and started the patient on hydrochlorothiazide.  Patient states he was feeling head pressure this morning and checked his blood pressure and it was 138/98.  Patient was concerned given the high diastolic numbers so he came to the emergency department for evaluation.  During my evaluation patient's blood pressure is currently 146/82 without any intervention in the emergency department.  Patient did take his morning blood pressure medications prior to arrival per patient.  Patient describes having discomfort in his back at work especially when seated for a long time.  Patient states he is having to stand up and walk around to relieve the discomfort in his lower back.  States when he gets the discomfort he has checked his blood pressure and often times it is elevated.  Patient is concerned that the blood pressure is leading to his back pain.  Physical Exam   Triage Vital Signs: ED Triage Vitals [05/24/22 0725]  Enc Vitals Group     BP (!) 146/97     Pulse Rate 72     Resp 16     Temp 98.1 F (36.7 C)     Temp Source Oral     SpO2 91 %     Weight (!) 462 lb 11.9 oz (209.9 kg)     Height 6\' 1"  (1.854 m)     Head Circumference      Peak Flow      Pain Score 0     Pain Loc      Pain Edu?      Excl. in GC?     Most recent vital signs: Vitals:   05/24/22 0725  BP: (!) 146/97  Pulse: 72  Resp: 16  Temp: 98.1 F (36.7 C)  SpO2: 91%     General: Awake, no distress.  CV:  Good peripheral perfusion.  Regular rate and rhythm  Resp:  Normal effort.  Equal breath sounds bilaterally.  Abd:  No distention.  Soft, nontender.  No rebound or guarding.  Obese.  Umbilical hernia without sign of obstruction/incarceration. Other:  No lower extremity edema on exam   ED Results / Procedures / Treatments   MEDICATIONS ORDERED IN ED: Medications - No data to display   IMPRESSION / MDM / ASSESSMENT AND PLAN / ED COURSE  I reviewed the triage vital signs and the nursing notes.  Patient's presentation is most consistent with acute illness / injury with system symptoms.  Patient presents emergency department for several weeks of worsening back pain especially when sitting at work as well as hypertension.  Overall the patient appears well, no distress.  Blood pressure currently 146/82.  I had a long discussion with the patient, highly suspect that the patient's back pain is leading to intermittent hypertension and not vice versa.  Patient states at times he will have some tightness in the chest as well.  We will check labs including cardiac enzymes although the patient denies any chest pain currently.  No shortness of breath symptoms.  Patient's blood pressure seems relatively well-controlled at least during his brief ER stay so far.  Discussed with patient I would be hesitant to place the patient on any further blood pressure medicines especially since his blood pressure medicines were just changed approximately 1 week ago.  Discussed with the patient he would need to wait at least 2-3 more weeks for maximum benefit before making any further medication adjustments through his PCP (if needed at all).  I had a discussion with the patient regarding blood pressure, highly suspect his blood pressure is more the result of physical discomfort and not vice versa.  If the patient's labs do not show any concerning findings anticipate that the patient could  be safely discharged home to follow-up with his PCP.  Patient's lab work is reassuring normal CBC, reassuring chemistry, negative troponin.  Given the patient's reassuring workup reassuring physical exam reassuring vital signs current blood pressure 129/82, I believe the patient is safe for discharge home with PCP follow-up.  Patient agreeable.  FINAL CLINICAL IMPRESSION(S) / ED DIAGNOSES   Hypertension Back pain  Note:  This document was prepared using Dragon voice recognition software and may include unintentional dictation errors.   Minna Antis, MD 05/24/22 [phone]

## 2022-05-28 ENCOUNTER — Telehealth: Payer: Self-pay

## 2022-05-28 NOTE — Transitions of Care (Post Inpatient/ED Visit) (Signed)
05/28/2022  Name: Aaron Frost MRN: [id number] DOB: 1980-10-25  Today's TOC FU Call Status: Today's TOC FU Call Status:: Successful TOC FU Call Competed TOC FU Call Complete Date: 05/28/22  Red on EMMI-ED Discharge Alert Date & Reason:05/25/22 "Scheduled follow-up appt? No"  Transition Care Management Follow-up Telephone Call Date of Discharge: 05/24/22 Discharge Facility: [facility]) Type of Discharge: Emergency Department Reason for ED Visit: Other: ("HTN,unspecified type, low back pain") How have you been since you were released from the hospital?: Better (Pt reports he is "feeling better-currently at work." He took BP while on the phone-BP 153/97. States BP normally higher at work- he did take BP med this morning-denies any sxs at present.) Any questions or concerns?: No  Items Reviewed: Did you receive and understand the discharge instructions provided?: Yes Medications obtained,verified, and reconciled?: Yes (Medications Reviewed) Any new allergies since your discharge?: No Dietary orders reviewed?: Yes Type of Diet Ordered:: low salt/heart healthy Do you have support at home?: Yes People in Home: alone  Medications Reviewed Today: Medications Reviewed Today     Reviewed by Charlyn Minerva, RN (Registered Nurse) on 05/28/22 at 1329  Med List Status: <None>   Medication Order Taking? Sig Documenting Provider Last Dose Status Informant  acetaminophen (TYLENOL) 500 MG tablet [id number] Yes Take 500 mg by mouth every 6 (six) hours as needed for mild pain.  [provider] Taking Active Self    Discontinued 03/16/11 1004   hydrocortisone (ANUSOL-HC) 2.5 % rectal cream [id number] Yes Place 1 Application rectally 2 (two) times daily. Immordino, Stephen, FNP Taking Active   ketoconazole (NIZORAL) 2 % cream [id number] Yes Apply to area 1-2 times daily Rodey, Miller, [place] Taking Active   loratadine (CLARITIN) 10 MG tablet  [id number] Yes Take 10 mg by mouth daily. [provider] Taking Active   Multiple Vitamin (MULTIVITAMIN WITH MINERALS) TABS tablet [id number] Yes Take 1 tablet by mouth daily. [provider] Taking Active Self  omeprazole (PRILOSEC OTC) 20 MG tablet [id number] Yes Take 20 mg by mouth daily as needed.  [provider] Taking Active Self  valsartan-hydrochlorothiazide (DIOVAN-HCT) 80-12.5 MG tablet [id number] Yes Take 1 tablet by mouth daily. Jarold Motto, [place] Taking Active   Zinc 30 MG CAPS [id number] Yes Take 30 mg by mouth. [provider] Taking Active             Home Care and Equipment/Supplies: Were Home Health Services Ordered?: NA Any new equipment or medical supplies ordered?: NA  Functional Questionnaire: Do you need assistance with bathing/showering or dressing?: No Do you need assistance with meal preparation?: No Do you need assistance with eating?: No Do you have difficulty maintaining continence: No Do you need assistance with getting out of bed/getting out of a chair/moving?: No Do you have difficulty managing or taking your medications?: No  Follow up appointments reviewed: PCP Follow-up appointment confirmed?: Yes Date of PCP follow-up appointment?: 06/13/22 (pt did not want sooner appt-states he is in training at work-unable to take off until June) Follow-up Provider: Isidor Holts Specialist [facility] Follow-up appointment confirmed?: Yes Date of Specialist follow-up appointment?: 06/08/22 Follow-Up Specialty Provider:: Dr. Clent Ridges Do you need transportation to your follow-up appointment?: No Do you understand care options if your condition(s) worsen?: Yes-patient verbalized understanding  SDOH Interventions Today    Flowsheet Row Most Recent Value  SDOH Interventions   Food Insecurity Interventions Intervention Not Indicated  Transportation Interventions Intervention Not Indicated      TOC Interventions Today  Flowsheet Row Most Recent Value  TOC Interventions   TOC Interventions Discussed/Reviewed TOC Interventions Discussed, Arranged PCP follow up less than 12 days/Care Guide scheduled  [pt requested appt in June due to work scheduled and not being able to take off work until then]      Interventions Today    Flowsheet Row Most Recent Value  Chronic Disease   Chronic disease during today's visit Hypertension (HTN)  General Interventions   General Interventions Discussed/Reviewed General Interventions Discussed, Durable Medical Equipment (DME), Doctor Visits  Doctor Visits Discussed/Reviewed Doctor Visits Discussed, PCP, Specialist  Durable Medical Equipment (DME) BP Cuff  [pt has BP machine-checks BP daily-instructed to take BP reading log to MD f/u appt]  PCP/Specialist Visits Compliance with follow-up visit  Education Interventions   Education Provided Provided Education  Provided Verbal Education On Nutrition, When to see the doctor, Medication  Nutrition Interventions   Nutrition Discussed/Reviewed Adding fruits and vegetables, Decreasing salt  Pharmacy Interventions   Pharmacy Dicussed/Reviewed Pharmacy Topics Discussed, Medications and their functions       Alessandra Grout [facility] Care Management Care Management Community Coordinator Direct Phone: [phone] Toll Free: [phone] Fax: [phone]

## 2022-06-05 NOTE — Progress Notes (Shared)
Aaron Frost is a 42 y.o. male here for a hospital/ED follow-up.  History of Present Illness:   No chief complaint on file.   HPI  Back Pain Seen in ED 05/24/22 for back pain, hypertension. Hypertension suspected to be the result of back pain. Labs normal. Taking valsartan-hydrochlorothiazide 80-12.5 mg daily.  Past Medical History:  Diagnosis Date   Asthma 08/09/2017   Hypertension    OSA on CPAP    Umbilical hernia 2016     Social History   Tobacco Use   Smoking status: Former    Packs/day: 1.50    Years: 3.00    Additional pack years: 0.00    Total pack years: 4.50    Types: Cigarettes    Quit date: 01/09/2004    Years since quitting: 18.4   Smokeless tobacco: Never  Substance Use Topics   Alcohol use: Yes    Comment: very rare glass of wine   Drug use: No    Past Surgical History:  Procedure Laterality Date   EXTERNAL EAR SURGERY     right ear reconstruction   MOLE REMOVAL     Baco of Neck   TONSILLECTOMY  2005    Family History  Problem Relation Age of Onset   Hypertension Mother    Heart disease Father    Benign prostatic hyperplasia Father    Ovarian cancer Sister    Diabetes Maternal Grandfather    Colon cancer Paternal Grandfather 25   Heart disease Paternal Grandfather    Stroke Paternal Grandfather    Breast cancer Neg Hx     Allergies  Allergen Reactions   Aleve [Naproxen] Other (See Comments)    Burning sensation and headaches   Nickel Rash    Current Medications:   Current Outpatient Medications:    acetaminophen (TYLENOL) 500 MG tablet, Take 500 mg by mouth every 6 (six) hours as needed for mild pain. , Disp: , Rfl:    hydrocortisone (ANUSOL-HC) 2.5 % rectal cream, Place 1 Application rectally 2 (two) times daily., Disp: 30 g, Rfl: 0   ketoconazole (NIZORAL) 2 % cream, Apply to area 1-2 times daily, Disp: 15 g, Rfl: 0   loratadine (CLARITIN) 10 MG tablet, Take 10 mg by mouth daily., Disp: , Rfl:    Multiple Vitamin  (MULTIVITAMIN WITH MINERALS) TABS tablet, Take 1 tablet by mouth daily., Disp: , Rfl:    omeprazole (PRILOSEC OTC) 20 MG tablet, Take 20 mg by mouth daily as needed. , Disp: , Rfl:    valsartan-hydrochlorothiazide (DIOVAN-HCT) 80-12.5 MG tablet, Take 1 tablet by mouth daily., Disp: 30 tablet, Rfl: 1   Zinc 30 MG CAPS, Take 30 mg by mouth., Disp: , Rfl:    Review of Systems:   ROS  Vitals:   There were no vitals filed for this visit.   There is no height or weight on file to calculate BMI.  Physical Exam:   Physical Exam  Assessment and Plan:   ***   I,Alexander Ruley,acting as a scribe for Jarold Motto, PA.,have documented all relevant documentation on the behalf of Jarold Motto, PA,as directed by  Jarold Motto, PA while in the presence of Jarold Motto, [place].   ***   Jarold Motto, PA-C

## 2022-06-08 ENCOUNTER — Ambulatory Visit: Payer: BC Managed Care – PPO | Admitting: Primary Care

## 2022-06-13 ENCOUNTER — Inpatient Hospital Stay: Payer: BC Managed Care – PPO | Admitting: Physician Assistant

## 2022-06-26 ENCOUNTER — Ambulatory Visit (INDEPENDENT_AMBULATORY_CARE_PROVIDER_SITE_OTHER): Payer: BC Managed Care – PPO | Admitting: Physician Assistant

## 2022-06-26 VITALS — BP 126/80 | HR 70 | Temp 97.8°F | Ht 73.0 in | Wt >= 6400 oz

## 2022-06-26 DIAGNOSIS — I10 Essential (primary) hypertension: Secondary | ICD-10-CM | POA: Diagnosis not present

## 2022-06-26 MED ORDER — VALSARTAN-HYDROCHLOROTHIAZIDE 80-12.5 MG PO TABS: 1.0000 | ORAL_TABLET | Freq: Every day | ORAL | 1 refills | Status: DC

## 2022-06-26 NOTE — Progress Notes (Signed)
Aaron Frost is a 42 y.o. male here for a new problem.  History of Present Illness:   Chief Complaint  Patient presents with   Hospitalization Follow-up    Pt went due to high BP notes pain in sitting and laying at the time     HPI  Hospital follow up; HTN He presented to the ED on 5/16 for elevated blood pressure, chest tightness, and head pressure.  He endorses pain when sitting and laying down at the time.  He noted his symptoms were worse in the morning and night. At initial check his blood pressure was 138/98 and when rechecked during visit was 146/82.  He is currently taking valsartan-hydrochloroTHIAZIDE 80-12.5 mg  It was suspected his back pain was leading to intermittent hypertension.  He has been feeling better since his discharge and believes his antihypertensives have been helping.  He has not been monitoring his blood pressure at home due to his blood pressure cuff being at work. He is currently unable to access his work building due to repairs.   BP Readings from Last 3 Encounters:  06/26/22 126/80  05/24/22 129/82  05/17/22 (!) 140/108   Past Medical History:  Diagnosis Date   Asthma 08/09/2017   Hypertension    OSA on CPAP    Umbilical hernia 2016     Social History   Tobacco Use   Smoking status: Former    Packs/day: 1.50    Years: 3.00    Additional pack years: 0.00    Total pack years: 4.50    Types: Cigarettes    Quit date: 01/09/2004    Years since quitting: 18.4   Smokeless tobacco: Never  Substance Use Topics   Alcohol use: Yes    Comment: very rare glass of wine   Drug use: No    Past Surgical History:  Procedure Laterality Date   EXTERNAL EAR SURGERY     right ear reconstruction   MOLE REMOVAL     [place] of [place]   TONSILLECTOMY  2005    Family History  Problem Relation Age of Onset   Hypertension Mother    Heart disease Father    Benign prostatic hyperplasia Father    Ovarian cancer Sister    Diabetes Maternal  Grandfather    Colon cancer Paternal Grandfather 33   Heart disease Paternal Grandfather    Stroke Paternal Grandfather    Breast cancer Neg Hx     Allergies  Allergen Reactions   Aleve [Naproxen] Other (See Comments)    Burning sensation and headaches   Nickel Rash    Current Medications:   Current Outpatient Medications:    acetaminophen (TYLENOL) 500 MG tablet, Take 500 mg by mouth every 6 (six) hours as needed for mild pain. , Disp: , Rfl:    hydrocortisone (ANUSOL-HC) 2.5 % rectal cream, Place 1 Application rectally 2 (two) times daily., Disp: 30 g, Rfl: 0   ketoconazole (NIZORAL) 2 % cream, Apply to area 1-2 times daily, Disp: 15 g, Rfl: 0   loratadine (CLARITIN) 10 MG tablet, Take 10 mg by mouth daily., Disp: , Rfl:    Multiple Vitamin (MULTIVITAMIN WITH MINERALS) TABS tablet, Take 1 tablet by mouth daily., Disp: , Rfl:    omeprazole (PRILOSEC OTC) 20 MG tablet, Take 20 mg by mouth daily as needed. , Disp: , Rfl:    Zinc 30 MG CAPS, Take 30 mg by mouth., Disp: , Rfl:    valsartan-hydrochlorothiazide (DIOVAN-HCT) 80-12.5 MG tablet, Take 1 tablet  by mouth daily., Disp: 30 tablet, Rfl: 1   Review of Systems:   ROS Negative unless otherwise specified per HPI.   Vitals:   Vitals:   06/26/22 1057  BP: 126/80  Pulse: 70  Temp: 97.8 F (36.6 C)  TempSrc: Temporal  SpO2: 96%  Weight: (!) 460 lb 12.8 oz (209 kg)  Height: 6\' 1"  (1.854 m)     Body mass index is 60.8 kg/m.  Physical Exam:   Physical Exam Vitals and nursing note reviewed.  Constitutional:      General: He is not in acute distress.    Appearance: He is well-developed. He is not ill-appearing or toxic-appearing.  Cardiovascular:     Rate and Rhythm: Normal rate and regular rhythm.     Pulses: Normal pulses.     Heart sounds: Normal heart sounds, S1 normal and S2 normal.  Pulmonary:     Effort: Pulmonary effort is normal.     Breath sounds: Normal breath sounds.  Skin:    General: Skin is warm and  dry.  Neurological:     Mental Status: He is alert.     GCS: GCS eye subscore is 4. GCS verbal subscore is 5. GCS motor subscore is 6.  Psychiatric:        Speech: Speech normal.        Behavior: Behavior normal. Behavior is cooperative.     Assessment and Plan:   Essential hypertension Normotensive No evidence of end-organ damage on my exam Recommend patient monitor home blood pressure at least a few times weekly Continue valsartan-hydrochloroTHIAZIDE 80-12.5 mg  mg daily If home monitoring shows consistent elevation, or any symptom(s) develop, recommend reach out to [place] for further advice on next steps    I,Rachel Rivera,acting as a scribe for Jarold Motto, PA.,have documented all relevant documentation on the behalf of Jarold Motto, PA,as directed by  Jarold Motto, PA while in the presence of Jarold Motto, [place].  I, Jarold Motto, [place], have reviewed all documentation for this visit. The documentation on 06/26/22 for the exam, diagnosis, procedures, and orders are all accurate and complete.   Jarold Motto, PA-C

## 2022-06-26 NOTE — Patient Instructions (Signed)
It was great to see you!  I think your blood pressure is doing fantastically  Take care,  Jarold Motto PA-C

## 2022-07-06 ENCOUNTER — Ambulatory Visit: Payer: BC Managed Care – PPO | Attending: Internal Medicine | Admitting: Internal Medicine

## 2022-07-06 ENCOUNTER — Encounter: Payer: Self-pay | Admitting: Internal Medicine

## 2022-07-06 ENCOUNTER — Encounter: Payer: Self-pay | Admitting: Nurse Practitioner

## 2022-07-06 ENCOUNTER — Ambulatory Visit (INDEPENDENT_AMBULATORY_CARE_PROVIDER_SITE_OTHER): Payer: BC Managed Care – PPO | Admitting: Nurse Practitioner

## 2022-07-06 VITALS — BP 130/80 | HR 76 | Ht 73.0 in | Wt >= 6400 oz

## 2022-07-06 VITALS — BP 120/86 | HR 78 | Temp 97.9°F | Ht 73.0 in | Wt >= 6400 oz

## 2022-07-06 DIAGNOSIS — R9431 Abnormal electrocardiogram [ECG] [EKG]: Secondary | ICD-10-CM

## 2022-07-06 DIAGNOSIS — G4734 Idiopathic sleep related nonobstructive alveolar hypoventilation: Secondary | ICD-10-CM

## 2022-07-06 DIAGNOSIS — K219 Gastro-esophageal reflux disease without esophagitis: Secondary | ICD-10-CM

## 2022-07-06 DIAGNOSIS — J4520 Mild intermittent asthma, uncomplicated: Secondary | ICD-10-CM

## 2022-07-06 DIAGNOSIS — G4733 Obstructive sleep apnea (adult) (pediatric): Secondary | ICD-10-CM | POA: Diagnosis not present

## 2022-07-06 DIAGNOSIS — I10 Essential (primary) hypertension: Secondary | ICD-10-CM

## 2022-07-06 DIAGNOSIS — E7800 Pure hypercholesterolemia, unspecified: Secondary | ICD-10-CM

## 2022-07-06 NOTE — Patient Instructions (Signed)
    Follow-Up: At [facility], you and your health needs are our priority.  As part of our continuing mission to provide you with exceptional heart care, we have created designated Provider Care Teams.  These Care Teams include your primary Cardiologist (physician) and Advanced Practice Providers (APPs -  Physician Assistants and Nurse Practitioners) who all work together to provide you with the care you need, when you need it.  We recommend signing up for the patient portal called "MyChart".  Sign up information is provided on this After Visit Summary.  MyChart is used to connect with patients for Virtual Visits (Telemedicine).  Patients are able to view lab/test results, encounter notes, upcoming appointments, etc.  Non-urgent messages can be sent to your provider as well.   To learn more about what you can do with MyChart, go to [email or web address].    Your next appointment:   6 month(s)  Provider:   Gayatri A Acharya, MD      

## 2022-07-06 NOTE — Assessment & Plan Note (Signed)
Stable. No recent flares/hospitalizations. Has SABA available. Action plan in place

## 2022-07-06 NOTE — Assessment & Plan Note (Signed)
Good compliance and control. He needs a new CPAP machine. Orders placed today. Will adjust his settings to 15-20 cmH2O. He still needs ONO on CPAP. Plan to order once he has his new machine and determine if supplemental O2 necessary. Understands proper use/care of device. Understands risks of untreated OSA. Aware of safe driving practices.  Patient Instructions  Continue to use CPAP every night, minimum of 4-6 hours a night.  Change equipment every 30 days or as directed by DME. Wash your tubing with warm soap and water daily, hang to dry. Wash humidifier portion weekly. Use bottled, distilled water and change daily  Be aware of reduced alertness and do not drive or operate heavy machinery if experiencing this or drowsiness.  Exercise encouraged, as tolerated. Healthy weight management discussed.   We will send orders for a new CPAP. I am going to adjust your settings to 15-20 cmH2O. You will need an overnight oxygen study still to see if we need to bleed oxygen through your CPAP.   We discussed how untreated sleep apnea puts an individual at risk for cardiac arrhthymias, pulm HTN, DM, stroke and increases their risk for daytime accidents.  Follow up in 12 weeks with Rhunette Croft, NP as a video visit. If symptoms do not improve or worsen, please contact office for sooner follow up or seek emergency care.

## 2022-07-06 NOTE — Patient Instructions (Signed)
Continue to use CPAP every night, minimum of 4-6 hours a night.  Change equipment every 30 days or as directed by DME. Wash your tubing with warm soap and water daily, hang to dry. Wash humidifier portion weekly. Use bottled, distilled water and change daily  Be aware of reduced alertness and do not drive or operate heavy machinery if experiencing this or drowsiness.  Exercise encouraged, as tolerated. Healthy weight management discussed.   We will send orders for a new CPAP. I am going to adjust your settings to 15-20 cmH2O. You will need an overnight oxygen study still to see if we need to bleed oxygen through your CPAP.   We discussed how untreated sleep apnea puts an individual at risk for cardiac arrhthymias, pulm HTN, DM, stroke and increases their risk for daytime accidents.  Follow up in 12 weeks with Rhunette Croft, NP as a video visit. If symptoms do not improve or worsen, please contact office for sooner follow up or seek emergency care.

## 2022-07-06 NOTE — Progress Notes (Signed)
@Patient  ID: Aaron Frost, male    DOB: 06-11-1980, 42 y.o.   MRN: [id number]  Chief Complaint  Patient presents with   Follow-up    Uses CPAP atleast 6 hours a night. No problems with mask or pressure.     Referring provider: Jarold Motto, PA  HPI: 42 year old male, former smoker followed for OSA on CPAP and asthma. He is a patient of Dr. Trena Platt and last seen in office 05/17/2021. Past medical history significant for obesity, HTN, allergic rhinitis, GERD, depression, HLD.  TEST/EVENTS:  08/02/2021 Split Night study: AHI 115.3/h, SpO2 low 51% >> CPAP 19 cmH2O with 3 L supplemental O2  05/17/2021: OV with Dr. Wynona Neat. Compliant with CPAP. Machine is over 31 years old. Hx of severe OSA. Last study in 2013 sowing severe OSA with severe O2 desats. At some point, had severe respiratory symptoms. Sleep is variable. Needs a repeat sleep study.   07/06/2022: Today - follow up Patient presents today for follow up.  Since he was seen last, he had a split-night study in July 2023 which revealed very severe obstructive sleep apnea.  He was titrated to CPAP 19 cmH2O with 3 L supplemental O2.  He was supposed to receive new CPAP but never did.  He was asked was to have an overnight oxygen study on his CPAP but he tells me that they had sent the box on when he was out of town so never got completed.  He wears his CPAP nightly.  He did have a few nights that he missed last week due to URI.  He receives benefit from use.  His machine is very old.  Needs a new 1.  He also needs new supplies.  Has not changed his mask out in quite some time.  Does not notice any significant leaks.  Still has some occasional daytime fatigue.  Denies any drowsy driving, morning headaches, sleep parasomnia/paralysis. No issues with his asthma/breathing. Doesn't have to use SABA.   05/11/2022-07/06/2022: CPAP 5-20 cmH2O 23/30 days; 77% >4 hr; average pressure 6 hours 18 minutes Pressure 95th 17.7 Leaks 95th 27.3 AHI  2.1  Allergies  Allergen Reactions   Aleve [Naproxen] Other (See Comments)    Burning sensation and headaches   Nickel Rash    Immunization History  Administered Date(s) Administered   COVID-19, mRNA, vaccine(Comirnaty)12 years and older 10/07/2021   Influenza Inj Mdck Quad Pf 11/02/2016   Influenza Split 10/27/2011   Influenza,inj,Quad PF,6+ Mos 10/31/2017, 10/08/2018   Influenza-Unspecified 10/08/2012, 10/08/2019, 09/21/2020, 11/08/2021   PFIZER(Purple Top)SARS-COV-2 Vaccination 03/30/2019, 04/21/2019, 10/12/2019   Tdap 03/16/2011, 12/27/2021    Past Medical History:  Diagnosis Date   Asthma 08/09/2017   Hypertension    OSA on CPAP    Umbilical hernia 2016    Tobacco History: Social History   Tobacco Use  Smoking Status Former   Packs/day: 1.50   Years: 3.00   Additional pack years: 0.00   Total pack years: 4.50   Types: Cigarettes   Quit date: 01/09/2004   Years since quitting: 18.5  Smokeless Tobacco Never   Counseling given: Not Answered   Outpatient Medications Prior to Visit  Medication Sig Dispense Refill   acetaminophen (TYLENOL) 500 MG tablet Take 500 mg by mouth every 6 (six) hours as needed for mild pain.      hydrocortisone (ANUSOL-HC) 2.5 % rectal cream Place 1 Application rectally 2 (two) times daily. 30 g 0   ketoconazole (NIZORAL) 2 % cream Apply to area 1-2  times daily 15 g 0   loratadine (CLARITIN) 10 MG tablet Take 10 mg by mouth daily.     Multiple Vitamin (MULTIVITAMIN WITH MINERALS) TABS tablet Take 1 tablet by mouth daily.     omeprazole (PRILOSEC OTC) 20 MG tablet Take 20 mg by mouth daily as needed.      valsartan-hydrochlorothiazide (DIOVAN-HCT) 80-12.5 MG tablet Take 1 tablet by mouth daily. 30 tablet 1   Zinc 30 MG CAPS Take 30 mg by mouth.     No facility-administered medications prior to visit.     Review of Systems:   Constitutional: No weight loss or gain, night sweats, fevers, chills, or lassitude. +occasional fatigue   HEENT: No headaches, difficulty swallowing, tooth/dental problems, or sore throat. No sneezing, itching, ear ache, nasal congestion, or post nasal drip CV:  No chest pain, orthopnea, PND, swelling in lower extremities, anasarca, dizziness, palpitations, syncope Resp: No shortness of breath with exertion or at rest. No excess mucus or change in color of mucus. No productive or non-productive. No hemoptysis. No wheezing.  No chest wall deformity GI:  No heartburn, indigestion  Skin: No rash, lesions, ulcerations MSK:  No joint pain or swelling.   Neuro: No dizziness or lightheadedness.  Psych: No depression or anxiety. Mood stable.     Physical Exam:  BP 120/86 (BP Location: Left Arm, Cuff Size: Normal)   Pulse 78   Temp 97.9 F (36.6 C)   Ht 6\' 1"  (1.854 m)   Wt (!) 463 lb 3.2 oz (210.1 kg)   SpO2 95%   BMI 61.11 kg/m   GEN: Pleasant, interactive, well-appearing; morbidly obese; in no acute distress. HEENT:  Normocephalic and atraumatic. PERRLA. Sclera white. Nasal turbinates pink, moist and patent bilaterally. No rhinorrhea present. Oropharynx pink and moist, without exudate or edema. No lesions, ulcerations, or postnasal drip.  NECK:  Supple w/ fair ROM. No JVD present. Normal carotid impulses w/o bruits. Thyroid symmetrical with no goiter or nodules palpated. No lymphadenopathy.   CV: RRR, no m/r/g, no peripheral edema. Pulses intact, +2 bilaterally. No cyanosis, pallor or clubbing. PULMONARY:  Unlabored, regular breathing. Clear bilaterally A&P w/o wheezes/rales/rhonchi. No accessory muscle use.  GI: BS present and normoactive. Soft, non-tender to palpation. No organomegaly or masses detected. MSK: No erythema, warmth or tenderness. Cap refil <2 sec all extrem. No deformities or joint swelling noted.  Neuro: A/Ox3. No focal deficits noted.   Skin: Warm, no lesions or rashe Psych: Normal affect and behavior. Judgement and thought content appropriate.     Lab Results:  CBC     Component Value Date/Time   WBC 5.9 05/24/2022 0754   RBC 5.68 05/24/2022 0754   HGB 15.6 05/24/2022 0754   HCT 48.2 05/24/2022 0754   PLT 246 05/24/2022 0754   MCV 84.9 05/24/2022 0754   MCH 27.5 05/24/2022 0754   MCHC 32.4 05/24/2022 0754   RDW 13.4 05/24/2022 0754   LYMPHSABS 1.7 12/27/2021 0915   MONOABS 0.5 12/27/2021 0915   EOSABS 0.2 12/27/2021 0915   BASOSABS 0.1 12/27/2021 0915    BMET    Component Value Date/Time   NA 135 05/24/2022 0754   K 3.5 05/24/2022 0754   CL 99 05/24/2022 0754   CO2 27 05/24/2022 0754   GLUCOSE 132 (H) 05/24/2022 0754   BUN 13 05/24/2022 0754   CREATININE 0.82 05/24/2022 0754   CREATININE 0.91 12/25/2019 1451   CALCIUM 8.6 (L) 05/24/2022 0754   GFRNONAA >60 05/24/2022 0754   GFRAA >60  08/11/2017 0347    BNP    Component Value Date/Time   BNP 9.1 08/09/2017 1229   BNP 15.3 10/04/2011 0910     Imaging:  No results found.        No data to display          No results found for: "NITRICOXIDE"      Assessment & Plan:   OSA on CPAP Good compliance and control. He needs a new CPAP machine. Orders placed today. Will adjust his settings to 15-20 cmH2O. He still needs ONO on CPAP. Plan to order once he has his new machine and determine if supplemental O2 necessary. Understands proper use/care of device. Understands risks of untreated OSA. Aware of safe driving practices.  Patient Instructions  Continue to use CPAP every night, minimum of 4-6 hours a night.  Change equipment every 30 days or as directed by DME. Wash your tubing with warm soap and water daily, hang to dry. Wash humidifier portion weekly. Use bottled, distilled water and change daily  Be aware of reduced alertness and do not drive or operate heavy machinery if experiencing this or drowsiness.  Exercise encouraged, as tolerated. Healthy weight management discussed.   We will send orders for a new CPAP. I am going to adjust your settings to 15-20 cmH2O. You  will need an overnight oxygen study still to see if we need to bleed oxygen through your CPAP.   We discussed how untreated sleep apnea puts an individual at risk for cardiac arrhthymias, pulm HTN, DM, stroke and increases their risk for daytime accidents.  Follow up in 12 weeks with Rhunette Croft, NP as a video visit. If symptoms do not improve or worsen, please contact office for sooner follow up or seek emergency care.    Asthma, mild intermittent Stable. No recent flares/hospitalizations. Has SABA available. Action plan in place   I spent 35 minutes of dedicated to the care of this patient on the date of this encounter to include pre-visit review of records, face-to-face time with the patient discussing conditions above, post visit ordering of testing, clinical documentation with the electronic health record, making appropriate referrals as documented, and communicating necessary findings to members of the patients care team.  Noemi Chapel, NP 07/06/2022  Pt aware and understands NP's role.

## 2022-07-06 NOTE — Progress Notes (Addendum)
Cardiology Office Note:    Date:  07/06/2022   ID:  Aaron Frost, DOB 08-12-1980, MRN [id number]  PCP:  Jarold Motto, PA  Cardiologist:  Parke Poisson, MD  Electrophysiologist:  None   Referring MD: Jarold Motto, PA   Chief Complaint: f/u chest pain and HTN.  History of Present Illness:    Aaron Frost is a 42 y.o. male with a history of OSA on CPAP, HTN, asthma, depression, morbid obesity, and reflux who presents today for evaluation of chest pain.  He has had no significant symptoms of chest pain since our last visit.  He is primarily concerned about hypoxia and shortness of breath with exertion.    He had a recent episode of back pain which led to an ER visit where he was noted to have an elevated blood pressure, felt to be due to pain response.  He has recently had his blood pressure medications adjusted by his primary care provider and is now on valsartan HCTZ 80-12.5 mg daily.  He is tolerating this well.  He denies any new chest pain or shortness of breath.  He does note a consistent shortness of breath which he relates to obesity.  He is addressing this with diet and exercise.  He has tried GLP-1 agonist and did not favor the side effects, and therefore discontinued.  We reviewed lipid panel obtained in December which shows mildly elevated LDL at 126 and reviewed goal LDL of less than 70.  Not currently on any lipid-lowering therapy and will address with conservative measures first per patient preference.  Past Medical History:  Diagnosis Date   Asthma 08/09/2017   Hypertension    OSA on CPAP    Umbilical hernia 2016    Past Surgical History:  Procedure Laterality Date   EXTERNAL EAR SURGERY     right ear reconstruction   MOLE REMOVAL     Baco of Neck   TONSILLECTOMY  2005    Current Medications: Current Meds  Medication Sig   acetaminophen (TYLENOL) 500 MG tablet Take 500 mg by mouth every 6 (six) hours as needed for mild pain.     hydrocortisone (ANUSOL-HC) 2.5 % rectal cream Place 1 Application rectally 2 (two) times daily.   ketoconazole (NIZORAL) 2 % cream Apply to area 1-2 times daily   loratadine (CLARITIN) 10 MG tablet Take 10 mg by mouth daily.   Multiple Vitamin (MULTIVITAMIN WITH MINERALS) TABS tablet Take 1 tablet by mouth daily.   omeprazole (PRILOSEC OTC) 20 MG tablet Take 20 mg by mouth daily as needed.    valsartan-hydrochlorothiazide (DIOVAN-HCT) 80-12.5 MG tablet Take 1 tablet by mouth daily.   Zinc 30 MG CAPS Take 30 mg by mouth.     Allergies:   Aleve [naproxen] and Nickel   Social History   Socioeconomic History   Marital status: Divorced    Spouse name: Not on file   Number of children: Not on file   Years of education: Not on file   Highest education level: Not on file  Occupational History   Occupation: customer service    Employer: [employer]  Tobacco Use   Smoking status: Former    Packs/day: 1.50    Years: 3.00    Additional pack years: 0.00    Total pack years: 4.50    Types: Cigarettes    Quit date: 01/09/2004    Years since quitting: 18.5   Smokeless tobacco: Never  Substance and Sexual Activity   Alcohol use: Yes  Comment: very rare glass of wine   Drug use: No   Sexual activity: Never  Other Topics Concern   Not on file  Social History Narrative   Work for [employer] -- sedentary   Divorced, Dec 2015    Live in [place]   Fun: walk, watch TV   Social Determinants of Health   Financial Resource Strain: Not on file  Food Insecurity: No Food Insecurity (05/28/2022)   Hunger Vital Sign    Worried About Running Out of Food in the Last Year: Never true    Ran Out of Food in the Last Year: Never true  Transportation Needs: No Transportation Needs (05/28/2022)   PRAPARE - Administrator, Civil Service (Medical): No    Lack of Transportation (Non-Medical): No  Physical Activity: Not on file  Stress: Not on file  Social Connections: Not on file      Family History: The patient's family history includes Benign prostatic hyperplasia in his father; Colon cancer (age of onset: 60) in his paternal grandfather; Diabetes in his maternal grandfather; Heart disease in his father and paternal grandfather; Hypertension in his mother; Ovarian cancer in his sister; Stroke in his paternal grandfather. There is no history of Breast cancer.  ROS:   Please see the history of present illness.    All other systems reviewed and are negative.  EKGs/Labs/Other Studies Reviewed:    The following studies were reviewed today:  EKG: EKG Interpretation Date/Time:  Friday July 06 2022 15:35:27 EDT Ventricular Rate:  78 PR Interval:  134 QRS Duration:  106 QT Interval:  450 QTC Calculation: 513 R Axis:   16  Text Interpretation: Critical Test Result: Long QTc Normal sinus rhythm Prolonged QT When compared with ECG of 09-Aug-2017 10:43, QT has lengthened Confirmed by Weston Brass ([id number]) on 07/06/2022 4:16:27 PM    Recent Labs: 05/24/2022: ALT 34; BUN 13; Creatinine, Ser 0.82; Hemoglobin 15.6; Platelets 246; Potassium 3.5; Sodium 135  Recent Lipid Panel    Component Value Date/Time   CHOL 184 12/27/2021 0915   TRIG 94.0 12/27/2021 0915   HDL 38.70 (L) 12/27/2021 0915   CHOLHDL 5 12/27/2021 0915   VLDL 18.8 12/27/2021 0915   LDLCALC 126 (H) 12/27/2021 0915   LDLCALC 136 (H) 12/25/2019 1451    Physical Exam:    VS:  BP 130/80   Pulse 76   Ht 6\' 1"  (1.854 m)   Wt (!) 463 lb 3.2 oz (210.1 kg)   SpO2 94%   BMI 61.11 kg/m     Wt Readings from Last 5 Encounters:  07/06/22 (!) 463 lb 3.2 oz (210.1 kg)  07/06/22 (!) 463 lb 3.2 oz (210.1 kg)  06/26/22 (!) 460 lb 12.8 oz (209 kg)  05/24/22 (!) 462 lb 11.9 oz (209.9 kg)  05/17/22 (!) 462 lb 12.8 oz (209.9 kg)     Constitutional: No acute distress Eyes: sclera non-icteric, normal conjunctiva and lids ENMT: normal dentition, moist mucous membranes Cardiovascular: regular rhythm, normal rate,  no murmurs. S1 and S2 normal.  No jugular venous distention.  Respiratory: clear to auscultation bilaterally GI : normal bowel sounds, soft and nontender. No distention.   MSK: extremities warm, well perfused. No edema.  NEURO: grossly nonfocal exam, moves all extremities. PSYCH: alert and oriented x 3, normal mood and affect.   ASSESSMENT:    1. Essential hypertension   2. OSA on CPAP   3. Mild intermittent asthma without complication   4. Gastroesophageal reflux  disease, unspecified whether esophagitis present   5. Pure hypercholesterolemia   6. Prolonged Q-T interval on ECG     PLAN:    Chest pain- Dyspnea on exertion-echocardiogram was unremarkable for source of dyspnea at our initial consultation and symptoms have not changed for the worse.  No chest pain, stable exertional dyspnea.  He would like to attempt diet lifestyle modification for weight reduction.  Hypertension-continue valsartan-HCTZ.   Hyperlipidemia-continue to encourage lifestyle modification for improvement of LDL.  Next lipid panel in December, will see the patient shortly after.  Sleep apnea-encourage compliance with CPAP  Obesity-exercise recommendations provided.  Prolonged QT interval-measured in lead V2, QT interval is mildly prolonged.  We discussed that prior to starting any new medications, patient should return for a repeat EKG either with his PCP or with our office to confirm QT interval has normalized.  No prior evidence of QT prolongation, do not suspect a channelopathy or inherited condition.  Recheck EKG at next follow-up.   Total time of encounter: 30 minutes total time of encounter, including 20 minutes spent in face-to-face patient care. This time includes coordination of care and counseling regarding above mentioned problem list. Remainder of non-face-to-face time involved reviewing chart documents/testing relevant to the patient encounter and documentation in the medical record. I have  independently reviewed documentation from referring provider.   Weston Brass, MD [place]  [facility]    Medication Adjustments/Labs and Tests Ordered: Current medicines are reviewed at length with the patient today.  Concerns regarding medicines are outlined above.  Orders Placed This Encounter  Procedures   EKG 12-Lead   No orders of the defined types were placed in this encounter.   Patient Instructions     Follow-Up: At [facility], you and your health needs are our priority.  As part of our continuing mission to provide you with exceptional heart care, we have created designated Provider Care Teams.  These Care Teams include your primary Cardiologist (physician) and Advanced Practice Providers (APPs -  Physician Assistants and Nurse Practitioners) who all work together to provide you with the care you need, when you need it.  We recommend signing up for the patient portal called "MyChart".  Sign up information is provided on this After Visit Summary.  MyChart is used to connect with patients for Virtual Visits (Telemedicine).  Patients are able to view lab/test results, encounter notes, upcoming appointments, etc.  Non-urgent messages can be sent to your provider as well.   To learn more about what you can do with MyChart, go to [email or web address].    Your next appointment:   6 month(s)  Provider:   Parke Poisson, MD

## 2022-07-19 ENCOUNTER — Encounter: Payer: Self-pay | Admitting: Physician Assistant

## 2022-07-20 MED ORDER — VALSARTAN-HYDROCHLOROTHIAZIDE 80-12.5 MG PO TABS: 1.0000 | ORAL_TABLET | Freq: Every day | ORAL | 2 refills | Status: DC

## 2022-07-20 MED ORDER — VALSARTAN-HYDROCHLOROTHIAZIDE 80-12.5 MG PO TABS: 1.0000 | ORAL_TABLET | Freq: Every day | ORAL | 1 refills | Status: DC

## 2022-07-20 NOTE — Telephone Encounter (Signed)
Patient states pharmacy informed him that in order for medication to be covered, a 90 day supply has to be sent in since 60 day supply was already completed. Pharmacy to be sent to is [facility] on [address] in [place].

## 2022-07-20 NOTE — Addendum Note (Signed)
Addended by: Jimmye Norman on: 07/20/2022 11:56 AM   Modules accepted: Orders

## 2022-08-01 ENCOUNTER — Ambulatory Visit (INDEPENDENT_AMBULATORY_CARE_PROVIDER_SITE_OTHER): Payer: BC Managed Care – PPO | Admitting: Physician Assistant

## 2022-08-01 ENCOUNTER — Encounter: Payer: Self-pay | Admitting: Physician Assistant

## 2022-08-01 VITALS — BP 136/90 | HR 70 | Temp 96.9°F | Ht 73.0 in | Wt >= 6400 oz

## 2022-08-01 DIAGNOSIS — I10 Essential (primary) hypertension: Secondary | ICD-10-CM | POA: Diagnosis not present

## 2022-08-01 DIAGNOSIS — R300 Dysuria: Secondary | ICD-10-CM

## 2022-08-01 LAB — POCT URINALYSIS DIPSTICK
Bilirubin, UA: NEGATIVE
Blood, UA: NEGATIVE
Glucose, UA: NEGATIVE
Ketones, UA: NEGATIVE
Leukocytes, UA: NEGATIVE
Nitrite, UA: NEGATIVE
Protein, UA: NEGATIVE
Spec Grav, UA: 1.025 (ref 1.010–1.025)
Urobilinogen, UA: 0.2 E.U./dL
pH, UA: 5.5 (ref 5.0–8.0)

## 2022-08-01 NOTE — Progress Notes (Signed)
Aaron Frost is a 42 y.o. male here for a new problem.  History of Present Illness:   Chief Complaint  Patient presents with  . Dysuria    Pt c/o burning with urination off and on started last Thursday, slight pain on penis over the weekend, Monday night discomfort all night.     HPI  UTI Symptoms: Complains of urgency and intermittent genital pain he rates 2-3/10 that began last Thursday. Reports associated constipation (mostly improved, managed with prune juice) and cramping in genital area. States that pain has been affecting sleep; Monday night he was unable to fall asleep at all due to the pain.  Endorses taking Tylenol on Monday with minimal relief. Reports drinking 3-4 bottles of water daily.  Denies dysuria, malaise, or any flu-like symptoms.    HTN Currently taking valsartan-hctz 80-12.5 mg. At home blood pressure readings are: not checked. Patient denies chest pain, SOB, blurred vision, dizziness, unusual headaches, lower leg swelling. Patient is compliant with medication. Denies excessive caffeine intake, stimulant usage, excessive alcohol intake, or increase in salt consumption.  BP Readings from Last 3 Encounters:  08/01/22 (!) 142/90  07/06/22 130/80  07/06/22 120/86     Past Medical History:  Diagnosis Date  . Asthma 08/09/2017  . Hypertension   . OSA on CPAP   . Umbilical hernia 2016     Social History   Tobacco Use  . Smoking status: Former    Current packs/day: 0.00    Average packs/day: 1.5 packs/day for 3.0 years (4.5 ttl pk-yrs)    Types: Cigarettes    Start date: 01/08/2001    Quit date: 01/09/2004    Years since quitting: 18.5  . Smokeless tobacco: Never  Substance Use Topics  . Alcohol use: Yes    Comment: very rare glass of wine  . Drug use: No    Past Surgical History:  Procedure Laterality Date  . EXTERNAL EAR SURGERY     right ear reconstruction  . MOLE REMOVAL     Baco of Neck  . TONSILLECTOMY  2005    Family History   Problem Relation Age of Onset  . Hypertension Mother   . Heart disease Father   . Benign prostatic hyperplasia Father   . Ovarian cancer Sister   . Diabetes Maternal Grandfather   . Colon cancer Paternal Grandfather 61  . Heart disease Paternal Grandfather   . Stroke Paternal Grandfather   . Breast cancer Neg Hx     Allergies  Allergen Reactions  . Aleve [Naproxen] Other (See Comments)    Burning sensation and headaches  . Nickel Rash    Current Medications:   Current Outpatient Medications:  .  acetaminophen (TYLENOL) 500 MG tablet, Take 500 mg by mouth every 6 (six) hours as needed for mild pain. , Disp: , Rfl:  .  loratadine (CLARITIN) 10 MG tablet, Take 10 mg by mouth daily., Disp: , Rfl:  .  Multiple Vitamin (MULTIVITAMIN WITH MINERALS) TABS tablet, Take 1 tablet by mouth daily., Disp: , Rfl:  .  omeprazole (PRILOSEC OTC) 20 MG tablet, Take 20 mg by mouth daily as needed. , Disp: , Rfl:  .  valsartan-hydrochlorothiazide (DIOVAN-HCT) 80-12.5 MG tablet, Take 1 tablet by mouth daily., Disp: 90 tablet, Rfl: 1 .  Zinc 30 MG CAPS, Take 30 mg by mouth., Disp: , Rfl:    Review of Systems:   Review of Systems  Gastrointestinal:  Positive for constipation.  Genitourinary:  Positive for urgency.       (+)  Genital pain (intermittent, 2-3/10) (+) Cramping (genital region)    Vitals:   Vitals:   08/01/22 1338  BP: (!) 142/90  Pulse: 70  Temp: (!) 96.9 F (36.1 C)  TempSrc: Temporal  SpO2: 94%  Weight: (!) 467 lb 4 oz (211.9 kg)  Height: 6\' 1"  (1.854 m)     Body mass index is 61.65 kg/m.  Physical Exam:   Physical Exam Vitals and nursing note reviewed. Exam conducted with a chaperone present.  Constitutional:      General: He is not in acute distress.    Appearance: He is well-developed. He is not ill-appearing or toxic-appearing.  Cardiovascular:     Rate and Rhythm: Normal rate and regular rhythm.     Pulses: Normal pulses.     Heart sounds: Normal heart  sounds, S1 normal and S2 normal.  Pulmonary:     Effort: Pulmonary effort is normal.     Breath sounds: Normal breath sounds.  Genitourinary:    Penis: Normal.      Testes: Normal.     Epididymis:     Right: Normal.     Left: Normal.  Skin:    General: Skin is warm and dry.  Neurological:     Mental Status: He is alert.     GCS: GCS eye subscore is 4. GCS verbal subscore is 5. GCS motor subscore is 6.  Psychiatric:        Speech: Speech normal.        Behavior: Behavior normal. Behavior is cooperative.   Results for orders placed or performed in visit on 08/01/22  POCT urinalysis dipstick  Result Value Ref Range   Color, UA yellow    Clarity, UA clear    Glucose, UA Negative Negative   Bilirubin, UA Negative    Ketones, UA Negative    Spec Grav, UA 1.025 1.010 - 1.025   Blood, UA Negative    pH, UA 5.5 5.0 - 8.0   Protein, UA Negative Negative   Urobilinogen, UA 0.2 0.2 or 1.0 E.U./dL   Nitrite, UA Negative    Leukocytes, UA Negative Negative   Appearance     Odor      Assessment and Plan:   Dysuria No red flags Suspect symptom(s) related to constipation -- recommend miralax as needed Reports that symptom(s) are improving with time Discussed options to go ahead and treat for e. Coli urinary tract infection (UTI) (Has had earlier this year) - vs wait on culture-- since he is improving he is going to await culture He will message [place] if any concerns  Essential hypertension Above goal today No evidence of end-organ damage on my exam Recommend patient monitor home blood pressure at least a few times weekly Continue valsartan-hctz 80-12.5 mg mg daily If home monitoring shows consistent elevation, or any symptom(s) develop, recommend reach out to [place] for further advice on next steps   I,Emily Lagle,acting as a scribe for [employer], PA.,have documented all relevant documentation on the behalf of Jarold Motto, PA,as directed by  Jarold Motto, PA while in the  presence of Jarold Motto, [place].  I, Jarold Motto, [place], have reviewed all documentation for this visit. The documentation on 08/01/22 for the exam, diagnosis, procedures, and orders are all accurate and complete.  Jarold Motto, PA-C

## 2022-08-01 NOTE — Patient Instructions (Signed)
It was great to see you!  Keep me posted on your symptoms  Keep an eye on your blood pressure  I'll be in touch with your urine culture results  Take care,  Jarold Motto PA-C

## 2022-08-02 LAB — URINE CULTURE
MICRO NUMBER:: 15241040
SPECIMEN QUALITY:: ADEQUATE

## 2022-08-09 DIAGNOSIS — K5792 Diverticulitis of intestine, part unspecified, without perforation or abscess without bleeding: Secondary | ICD-10-CM | POA: Insufficient documentation

## 2022-08-09 DIAGNOSIS — K439 Ventral hernia without obstruction or gangrene: Secondary | ICD-10-CM

## 2022-08-09 HISTORY — DX: Ventral hernia without obstruction or gangrene: K43.9

## 2022-08-09 HISTORY — DX: Diverticulitis of intestine, part unspecified, without perforation or abscess without bleeding: K57.92

## 2022-08-14 NOTE — Progress Notes (Shared)
Aaron Frost is a 42 y.o. male here for a new problem.  History of Present Illness:   No chief complaint on file.   HPI   Takes omeprazole 20 mg daily as needed.    Past Medical History:  Diagnosis Date   Asthma 08/09/2017   Hypertension    OSA on CPAP    Umbilical hernia 2016     Social History   Tobacco Use   Smoking status: Former    Current packs/day: 0.00    Average packs/day: 1.5 packs/day for 3.0 years (4.5 ttl pk-yrs)    Types: Cigarettes    Start date: 01/08/2001    Quit date: 01/09/2004    Years since quitting: 18.6   Smokeless tobacco: Never  Substance Use Topics   Alcohol use: Yes    Comment: very rare glass of wine   Drug use: No    Past Surgical History:  Procedure Laterality Date   EXTERNAL EAR SURGERY     right ear reconstruction   MOLE REMOVAL     Baco of Neck   TONSILLECTOMY  2005    Family History  Problem Relation Age of Onset   Hypertension Mother    Heart disease Father    Benign prostatic hyperplasia Father    Ovarian cancer Sister    Diabetes Maternal Grandfather    Colon cancer Paternal Grandfather 80   Heart disease Paternal Grandfather    Stroke Paternal Grandfather    Breast cancer Neg Hx     Allergies  Allergen Reactions   Aleve [Naproxen] Other (See Comments)    Burning sensation and headaches   Nickel Rash    Current Medications:   Current Outpatient Medications:    acetaminophen (TYLENOL) 500 MG tablet, Take 500 mg by mouth every 6 (six) hours as needed for mild pain. , Disp: , Rfl:    loratadine (CLARITIN) 10 MG tablet, Take 10 mg by mouth daily., Disp: , Rfl:    Multiple Vitamin (MULTIVITAMIN WITH MINERALS) TABS tablet, Take 1 tablet by mouth daily., Disp: , Rfl:    omeprazole (PRILOSEC OTC) 20 MG tablet, Take 20 mg by mouth daily as needed. , Disp: , Rfl:    valsartan-hydrochlorothiazide (DIOVAN-HCT) 80-12.5 MG tablet, Take 1 tablet by mouth daily., Disp: 90 tablet, Rfl: 1   Zinc 30 MG CAPS, Take 30 mg  by mouth., Disp: , Rfl:    Review of Systems:   ROS  Vitals:   There were no vitals filed for this visit.   There is no height or weight on file to calculate BMI.  Physical Exam:   Physical Exam  Assessment and Plan:   ***   I,Alexander Ruley,acting as a scribe for Jarold Motto, PA.,have documented all relevant documentation on the behalf of Jarold Motto, PA,as directed by  Jarold Motto, PA while in the presence of Jarold Motto, [place].   ***   Jarold Motto, PA-C

## 2022-08-15 ENCOUNTER — Ambulatory Visit (INDEPENDENT_AMBULATORY_CARE_PROVIDER_SITE_OTHER): Payer: BC Managed Care – PPO | Admitting: Physician Assistant

## 2022-08-15 ENCOUNTER — Encounter: Payer: Self-pay | Admitting: Physician Assistant

## 2022-08-15 ENCOUNTER — Other Ambulatory Visit: Payer: Self-pay | Admitting: Physician Assistant

## 2022-08-15 VITALS — BP 122/80 | HR 64 | Temp 97.1°F | Ht 73.0 in | Wt >= 6400 oz

## 2022-08-15 DIAGNOSIS — K5900 Constipation, unspecified: Secondary | ICD-10-CM

## 2022-08-15 DIAGNOSIS — F5081 Binge eating disorder: Secondary | ICD-10-CM

## 2022-08-15 NOTE — Patient Instructions (Signed)
It was great to see you!  To treat your constipation today: -Drink at least 64 oz of water daily -Increase to a total of 2 capfuls of polyethylene glycol/ Miralax daily  If still no results, please call the office   If you want imaging, please call the office  Placing referral today for therapy  Take care,  Jarold Motto PA-C

## 2022-08-21 ENCOUNTER — Telehealth: Payer: Self-pay | Admitting: Physician Assistant

## 2022-08-21 ENCOUNTER — Encounter: Payer: Self-pay | Admitting: Emergency Medicine

## 2022-08-21 ENCOUNTER — Emergency Department
Admission: EM | Admit: 2022-08-21 | Discharge: 2022-08-21 | Disposition: A | Discharge status: Home / Self Care | Payer: BC Managed Care – PPO | Source: Home / Self Care | Attending: Emergency Medicine | Admitting: Emergency Medicine

## 2022-08-21 ENCOUNTER — Other Ambulatory Visit: Payer: Self-pay

## 2022-08-21 ENCOUNTER — Emergency Department: Payer: BC Managed Care – PPO

## 2022-08-21 DIAGNOSIS — K429 Umbilical hernia without obstruction or gangrene: Secondary | ICD-10-CM | POA: Insufficient documentation

## 2022-08-21 DIAGNOSIS — K5732 Diverticulitis of large intestine without perforation or abscess without bleeding: Secondary | ICD-10-CM | POA: Insufficient documentation

## 2022-08-21 DIAGNOSIS — I10 Essential (primary) hypertension: Secondary | ICD-10-CM | POA: Insufficient documentation

## 2022-08-21 DIAGNOSIS — K439 Ventral hernia without obstruction or gangrene: Secondary | ICD-10-CM | POA: Diagnosis not present

## 2022-08-21 DIAGNOSIS — R1084 Generalized abdominal pain: Secondary | ICD-10-CM | POA: Diagnosis not present

## 2022-08-21 DIAGNOSIS — K5792 Diverticulitis of intestine, part unspecified, without perforation or abscess without bleeding: Secondary | ICD-10-CM

## 2022-08-21 DIAGNOSIS — K828 Other specified diseases of gallbladder: Secondary | ICD-10-CM | POA: Diagnosis not present

## 2022-08-21 DIAGNOSIS — K5730 Diverticulosis of large intestine without perforation or abscess without bleeding: Secondary | ICD-10-CM | POA: Diagnosis not present

## 2022-08-21 LAB — COMPREHENSIVE METABOLIC PANEL
ALT: 34 U/L (ref 0–44)
AST: 22 U/L (ref 15–41)
Albumin: 3.7 g/dL (ref 3.5–5.0)
Alkaline Phosphatase: 59 U/L (ref 38–126)
Anion gap: 9 (ref 5–15)
BUN: 9 mg/dL (ref 6–20)
CO2: 25 mmol/L (ref 22–32)
Calcium: 8.7 mg/dL — ABNORMAL LOW (ref 8.9–10.3)
Chloride: 99 mmol/L (ref 98–111)
Creatinine, Ser: 0.9 mg/dL (ref 0.61–1.24)
GFR, Estimated: >60 mL/min (ref >60)
Glucose, Bld: 125 mg/dL — ABNORMAL HIGH (ref 70–99)
Potassium: 3.9 mmol/L (ref 3.5–5.1)
Sodium: 133 mmol/L — ABNORMAL LOW (ref 135–145)
Total Bilirubin: 1.4 mg/dL — ABNORMAL HIGH (ref 0.3–1.2)
Total Protein: 7.6 g/dL (ref 6.5–8.1)

## 2022-08-21 LAB — CBC
HCT: 44.5 % (ref 39.0–52.0)
Hemoglobin: 14.7 g/dL (ref 13.0–17.0)
MCH: 28.3 pg (ref 26.0–34.0)
MCHC: 33 g/dL (ref 30.0–36.0)
MCV: 85.6 fL (ref 80.0–100.0)
Platelets: 256 10*3/uL (ref 150–400)
RBC: 5.2 MIL/uL (ref 4.22–5.81)
RDW: 13.4 % (ref 11.5–15.5)
WBC: 11.9 10*3/uL — ABNORMAL HIGH (ref 4.0–10.5)
nRBC: 0 % (ref 0.0–0.2)

## 2022-08-21 LAB — LIPASE, BLOOD: Lipase: 28 U/L (ref 11–51)

## 2022-08-21 MED ORDER — AMOXICILLIN-POT CLAVULANATE 875-125 MG PO TABS
1.0000 | ORAL_TABLET | Freq: Once | ORAL | Status: AC
  Administered 2022-08-21: 1 via ORAL
  Filled 2022-08-21: qty 1

## 2022-08-21 MED ORDER — TRAMADOL HCL 50 MG PO TABS: 50.0000 mg | ORAL_TABLET | Freq: Four times a day (QID) | ORAL | 0 refills | Status: DC | PRN

## 2022-08-21 MED ORDER — IOHEXOL 300 MG/ML  SOLN: 100.0000 mL | Freq: Once | INTRAMUSCULAR | Status: DC | PRN

## 2022-08-21 MED ORDER — IOHEXOL 350 MG/ML SOLN
100.0000 mL | Freq: Once | INTRAVENOUS | Status: AC | PRN
  Administered 2022-08-21: 100 mL via INTRAVENOUS

## 2022-08-21 MED ORDER — AMOXICILLIN-POT CLAVULANATE 875-125 MG PO TABS: 1.0000 | ORAL_TABLET | Freq: Two times a day (BID) | ORAL | 0 refills | Status: DC

## 2022-08-21 NOTE — Telephone Encounter (Signed)
FYI: This call has been transferred to triage nurse: the Triage Nurse. Once the result note has been entered staff can address the message at that time.  Patient called in with the following symptoms:  Red Word:severe abdominal pain,chills, abdomen feels feverish, nausea   Please advise at Mobile [phone] (mobile)  Message is routed to Provider Pool.

## 2022-08-21 NOTE — ED Provider Notes (Signed)
[facility] Provider Note    Event Date/Time   First MD Initiated Contact with Patient 08/21/22 [phone]     (approximate)   History   Abdominal pain   HPI  Aaron Frost is a 42 y.o. male with a history of hypertension, obstructive sleep apnea, obesity who presents with complaints of abdominal pain.  Patient describes diffuse abdominal pain that is worsened over the last several days.  Was treated for constipation by PCP last week with little improvement.  Reports nausea, loose stools.  Does have a large umbilical hernia     Physical Exam   Triage Vital Signs: ED Triage Vitals  Encounter Vitals Group     BP 08/21/22 0924 (!) 145/89     Systolic BP Percentile --      Diastolic BP Percentile --      Pulse Rate 08/21/22 0924 88     Resp 08/21/22 0924 17     Temp 08/21/22 0924 98.2 F (36.8 C)     Temp src --      SpO2 08/21/22 0924 93 %     Weight 08/21/22 0926 (!) 208 kg (458 lb 8.9 oz)     Height 08/21/22 0926 1.854 m (6\' 1" )     Head Circumference --      Peak Flow --      Pain Score --      Pain Loc --      Pain Education --      Exclude from Growth Chart --     Most recent vital signs: Vitals:   08/21/22 0924  BP: (!) 145/89  Pulse: 88  Resp: 17  Temp: 98.2 F (36.8 C)  SpO2: 93%     General: Awake, no distress.  CV:  Good peripheral perfusion.  Resp:  Normal effort.  Abd:  No distention.  Umbilical hernia noted, soft, nontender, overall reassuring abdominal exam Other:     ED Results / Procedures / Treatments   Labs (all labs ordered are listed, but only abnormal results are displayed) Labs Reviewed  CBC - Abnormal; Notable for the following components:      Result Value   WBC 11.9 (*)    All other components within normal limits  COMPREHENSIVE METABOLIC PANEL - Abnormal; Notable for the following components:   Sodium 133 (*)    Glucose, Bld 125 (*)    Calcium 8.7 (*)    Total Bilirubin 1.4 (*)    All other  components within normal limits  LIPASE, BLOOD     EKG     RADIOLOGY     PROCEDURES:  Critical Care performed:   Procedures   MEDICATIONS ORDERED IN ED: Medications  iohexol (OMNIPAQUE) 350 MG/ML injection 100 mL (100 mLs Intravenous Contrast Given 08/21/22 1053)  amoxicillin-clavulanate (AUGMENTIN) 875-125 MG per tablet 1 tablet (1 tablet Oral Given 08/21/22 1255)     IMPRESSION / MDM / ASSESSMENT AND PLAN / ED COURSE  I reviewed the triage vital signs and the nursing notes. Patient's presentation is most consistent with acute presentation with potential threat to life or bodily function.  Patient presents with abdominal pain as detailed above.  Differential includes colitis, gastroenteritis, less likely SBO, diverticulitis  Will obtain labs, obtain CT abdomen pelvis and reevaluate.  CT scan is consistent with diverticulitis, will start the patient on Augmentin, analgesics  No indication for admission at this time, return precautions discussed, patient agrees with this plan.     FINAL CLINICAL IMPRESSION(S) /  ED DIAGNOSES   Final diagnoses:  Diverticulitis     Rx / DC Orders   ED Discharge Orders          Ordered    amoxicillin-clavulanate (AUGMENTIN) 875-125 MG tablet  2 times daily        08/21/22 1225    traMADol (ULTRAM) 50 MG tablet  Every 6 hours PRN        08/21/22 1225             Note:  This document was prepared using Dragon voice recognition software and may include unintentional dictation errors.   Jene Every, MD 08/21/22 [phone]

## 2022-08-21 NOTE — ED Triage Notes (Signed)
Patient arrives ambulatory by POV c/o constipation, bowels inflamed and chills. States he was sent by PCP. States he has an umbilical hernia. Last healthy BM 2 weeks ago.

## 2022-08-21 NOTE — ED Notes (Signed)
Pt presents to ED with c/o of diarrhea and ABD pain, pt states HX of hoitzia hernia and was told he needed to lose weight before he could have surgery on this. Pt states he has been taking miralax and "has doubled the max dose per my MD". Pt states he is having diarrhea type stools. Pt denies urinary s/s. Pt is A&Ox4.

## 2022-08-21 NOTE — Telephone Encounter (Signed)
FYI, see Triage note. 

## 2022-08-21 NOTE — Telephone Encounter (Signed)
Patient Advised: Go to ED Now  Patient Name First: Tab Last: Aaron Frost Gender: Male DOB: February 06, 1980 Age: 42 Y 1 M 26 D Return Phone Number: [phone] (Primary) Address: City/ State/ Zip: [place]  [address] Client [facility] - Administrator, [facility] at [facility] Provider Bufford Buttner, [place] Contact Type Call Who Is Calling Patient / Member / Family / Caregiver Call Type Triage / Clinical Relationship To Patient Self Return Phone Number [phone] (Primary) Chief Complaint SEVERE ABDOMINAL PAIN - Severe pain in abdomen Reason for Call Symptomatic / Request for Health Information Initial Comment Pt has severe abdominal pain, chills, and nausea. He states his abdominal feels feverish. Translation No Nurse Assessment Nurse: Suezanne Jacquet, RN, Riley Lam Date/Time (Eastern Time): 08/21/2022 8:28:09 AM Confirm and document reason for call. If symptomatic, describe symptoms. ---hx of constipation and was advised to increase the mirlax and then on Sunday had like soup and fiber such as broccoli etc Sunday night had some pea soup and thats when the pain began. Feels like he has been punched in the stomach. Very tender and thinks there is inflammation in the gut. changed diet soups and pudding and broth during this pain. Does the patient have any new or worsening symptoms? ---Yes Will a triage be completed? ---Yes Related visit to physician within the last 2 weeks? ---Yes Does the PT have any chronic conditions? (i.e. diabetes, asthma, this includes High risk factors for pregnancy, etc.) ---Yes List chronic conditions. ---umbilica hernia. Is this a behavioral health or substance abuse call? ---No Guidelines Guideline Title Affirmed Question Affirmed Notes Nurse Date/Time (Eastern Time) Abdominal Pain - Upper [1] Pain lasts > 10 minutes AND [2] age > 39 AND [3] at [place], [place] 08/21/2022  8:32:55 AM  Guidelines Guideline Title Affirmed Question Affirmed Notes Nurse Date/Time Lamount Cohen Time) least one cardiac risk factor (e.g., diabetes, high cholesterol, hypertension, obesity, smoker or strong family history of heart disease) Disp. Time Lamount Cohen Time) Disposition Final User 08/21/2022 8:27:08 AM Send to Urgent Queue Druscilla Brownie 08/21/2022 8:37:41 AM Go to ED Now Yes Suezanne Jacquet, RN, Riley Lam Final Disposition 08/21/2022 8:37:41 AM Go to ED Now Yes Suezanne Jacquet, RN, York Spaniel Disagree/Comply Comply Caller Understands Yes PreDisposition InappropriateToAsk Care Advice Given Per Guideline GO TO ED NOW: * Another adult should drive. * Leave now. Drive carefully. CARE ADVICE given per Abdominal Pain - Upper (Adult) guideline. BRING MEDICINES: * Bring a list of your current medicines when you go to the Emergency Department (ER). * Bring the pill bottles too. This will help the doctor (or NP/PA) to make certain you are taking the right medicines and the right dose  Comments User: Cameron Proud, RN Date/Time (Eastern Time): 08/21/2022 8:32:37 AM Last full bm was on Sunday and has been putting out liquid through the bowels. User: Cameron Proud, RN Date/Time Lamount Cohen Time): 08/21/2022 8:36:00 AM Rates pain as 3/10 at this time. User: Cameron Proud, RN Date/Time Lamount Cohen Time): 08/21/2022 8:39:30 AM Do not eat or drink until seen in ER and advised on when to do so. ( Possible surgical due to impaction etc. not sure. ) Referrals [facility] - ED

## 2022-08-24 ENCOUNTER — Telehealth: Payer: Self-pay

## 2022-08-24 NOTE — Transitions of Care (Post Inpatient/ED Visit) (Signed)
08/24/2022  Name: Aaron Frost MRN: [id number] DOB: 07/23/1980  Today's TOC FU Call Status: Today's TOC FU Call Status:: Successful TOC FU Call Completed TOC FU Call Complete Date: 08/24/22  Red on EMMI-ED Discharge Alert Date & Reason:815/24 "Scheduled follow-up appt? No"  Transition Care Management Follow-up Telephone Call Date of Discharge: 08/21/22 Discharge Facility: [facility]) Type of Discharge: Emergency Department Reason for ED Visit: Other: ("diverticultis") How have you been since you were released from the hospital?: Better Any questions or concerns?: Yes Patient Questions/Concerns:: Pt wanting food  ideas for diverticulitis Patient Questions/Concerns Addressed: Other: (RN CM educated pt on foods that are safe to eat and foods to avoid while dealing with diverticultitis)  Items Reviewed: Did you receive and understand the discharge instructions provided?: Yes Medications obtained,verified, and reconciled?: Yes (Medications Reviewed) Any new allergies since your discharge?: No Dietary orders reviewed?: Yes Type of Diet Ordered:: diverticulitis diet Do you have support at home?: Yes People in Home: alone  Medications Reviewed Today: Medications Reviewed Today     Reviewed by Charlyn Minerva, RN (Registered Nurse) on 08/24/22 at 1041  Med List Status: <None>   Medication Order Taking? Sig Documenting Provider Last Dose Status Informant  acetaminophen (TYLENOL) 500 MG tablet [id number] Yes Take 500 mg by mouth every 6 (six) hours as needed for mild pain.  [provider] Taking Active Self  amoxicillin-clavulanate (AUGMENTIN) 875-125 MG tablet [id number] Yes Take 1 tablet by mouth 2 (two) times daily for 7 days. Jene Every, MD Taking Active   docusate sodium (COLACE) 100 MG capsule [id number] Yes Take 100 mg by mouth 2 (two) times daily. [provider] Taking Active Self    Discontinued 03/16/11 1004    loratadine (CLARITIN) 10 MG tablet [id number] Yes Take 10 mg by mouth daily. [provider] Taking Active   Multiple Vitamin (MULTIVITAMIN WITH MINERALS) TABS tablet [id number] Yes Take 1 tablet by mouth daily. [provider] Taking Active Self  omeprazole (PRILOSEC OTC) 20 MG tablet [id number] Yes Take 20 mg by mouth daily as needed.  [provider] Taking Active Self  polyethylene glycol (MIRALAX / GLYCOLAX) 17 g packet [id number] Yes Take 17 g by mouth daily. [provider] Taking Active Self  traMADol (ULTRAM) 50 MG tablet [id number] Yes Take 1 tablet (50 mg total) by mouth every 6 (six) hours as needed. Jene Every, MD Taking Active   valsartan-hydrochlorothiazide (DIOVAN-HCT) 80-12.5 MG tablet [id number] Yes Take 1 tablet by mouth daily. Jarold Motto, [place] Taking Active   Zinc 30 MG CAPS [id number] Yes Take 30 mg by mouth. [provider] Taking Active             Home Care and Equipment/Supplies: Were Home Health Services Ordered?: NA Any new equipment or medical supplies ordered?: NA  Functional Questionnaire: Do you need assistance with bathing/showering or dressing?: No Do you need assistance with meal preparation?: No Do you need assistance with eating?: No Do you have difficulty maintaining continence: No Do you need assistance with getting out of bed/getting out of a chair/moving?: No Do you have difficulty managing or taking your medications?: No  Follow up appointments reviewed: PCP Follow-up appointment confirmed?: Yes Date of PCP follow-up appointment?: 08/28/22 Follow-up Provider: Isidor Holts Specialist [facility] Follow-up appointment confirmed?: NA Do you need transportation to your follow-up appointment?: No Do you understand care options if your condition(s) worsen?: Yes-patient verbalized understanding  SDOH Interventions Today    Flowsheet Row Most Recent Value  SDOH Interventions   Food Insecurity  Interventions Intervention Not Indicated  Transportation Interventions Intervention Not Indicated       Interventions Today    Flowsheet Row Most Recent Value  Chronic Disease   Chronic disease during today's visit Other  [diverticulitis]  General Interventions   General Interventions Discussed/Reviewed General Interventions Discussed, Doctor Visits  Doctor Visits Discussed/Reviewed Doctor Visits Discussed, PCP  PCP/Specialist Visits Compliance with follow-up visit  Education Interventions   Education Provided Provided Education  Provided Verbal Education On Nutrition, When to see the doctor, Medication, Other  [sx mgmt]  Nutrition Interventions   Nutrition Discussed/Reviewed Nutrition Discussed  Pharmacy Interventions   Pharmacy Dicussed/Reviewed Pharmacy Topics Discussed, Medications and their functions  Safety Interventions   Safety Discussed/Reviewed Safety Discussed       TOC Interventions Today    Flowsheet Row Most Recent Value  TOC Interventions   TOC Interventions Discussed/Reviewed TOC Interventions Discussed        Alessandra Grout [facility] Care Management Care Management Community Coordinator Direct Phone: [phone] Toll Free: [phone] Fax: [phone]

## 2022-08-27 NOTE — Progress Notes (Signed)
Aaron Frost is a 42 y.o. male here for a follow up of a pre-existing problem.  History of Present Illness:   No chief complaint on file.   HPI  Diverticulitis: On 8/13 presented to ED at [facility] for abdominal pain. CT of abdomen resulted with findings consistent with diverticulosis. Prescribed 875-125 mg Augmentin and 50 mg Ultram.   Past Medical History:  Diagnosis Date   Asthma 08/09/2017   Hypertension    OSA on CPAP    Umbilical hernia 2016     Social History   Tobacco Use   Smoking status: Former    Current packs/day: 0.00    Average packs/day: 1.5 packs/day for 3.0 years (4.5 ttl pk-yrs)    Types: Cigarettes    Start date: 01/08/2001    Quit date: 01/09/2004    Years since quitting: 18.6   Smokeless tobacco: Never  Substance Use Topics   Alcohol use: Yes    Comment: very rare glass of wine   Drug use: No    Past Surgical History:  Procedure Laterality Date   EXTERNAL EAR SURGERY     right ear reconstruction   MOLE REMOVAL     Baco of Neck   TONSILLECTOMY  2005    Family History  Problem Relation Age of Onset   Hypertension Mother    Heart disease Father    Benign prostatic hyperplasia Father    Ovarian cancer Sister    Diabetes Maternal Grandfather    Colon cancer Paternal Grandfather 62   Heart disease Paternal Grandfather    Stroke Paternal Grandfather    Breast cancer Neg Hx     Allergies  Allergen Reactions   Aleve [Naproxen] Other (See Comments)    Burning sensation and headaches   Nickel Rash    Current Medications:   Current Outpatient Medications:    acetaminophen (TYLENOL) 500 MG tablet, Take 500 mg by mouth every 6 (six) hours as needed for mild pain. , Disp: , Rfl:    amoxicillin-clavulanate (AUGMENTIN) 875-125 MG tablet, Take 1 tablet by mouth 2 (two) times daily for 7 days., Disp: 14 tablet, Rfl: 0   docusate sodium (COLACE) 100 MG capsule, Take 100 mg by mouth 2 (two) times daily., Disp: , Rfl:    loratadine  (CLARITIN) 10 MG tablet, Take 10 mg by mouth daily., Disp: , Rfl:    Multiple Vitamin (MULTIVITAMIN WITH MINERALS) TABS tablet, Take 1 tablet by mouth daily., Disp: , Rfl:    omeprazole (PRILOSEC OTC) 20 MG tablet, Take 20 mg by mouth daily as needed. , Disp: , Rfl:    polyethylene glycol (MIRALAX / GLYCOLAX) 17 g packet, Take 17 g by mouth daily., Disp: , Rfl:    traMADol (ULTRAM) 50 MG tablet, Take 1 tablet (50 mg total) by mouth every 6 (six) hours as needed., Disp: 20 tablet, Rfl: 0   valsartan-hydrochlorothiazide (DIOVAN-HCT) 80-12.5 MG tablet, Take 1 tablet by mouth daily., Disp: 90 tablet, Rfl: 1   Zinc 30 MG CAPS, Take 30 mg by mouth., Disp: , Rfl:    Review of Systems:   ROS  Vitals:   There were no vitals filed for this visit.   There is no height or weight on file to calculate BMI.  Physical Exam:   Physical Exam  Assessment and Plan:   There are no diagnoses linked to this encounter.  I,Emily Lagle,acting as a Neurosurgeon for [employer], PA.,have documented all relevant documentation on the behalf of Aaron Motto, PA,as directed  by  Aaron Motto, PA while in the presence of [place], [place].  *** Aaron Frost, [place]

## 2022-08-28 ENCOUNTER — Encounter: Payer: Self-pay | Admitting: Physician Assistant

## 2022-08-28 ENCOUNTER — Ambulatory Visit (INDEPENDENT_AMBULATORY_CARE_PROVIDER_SITE_OTHER): Payer: BC Managed Care – PPO | Admitting: Physician Assistant

## 2022-08-28 VITALS — BP 136/90 | HR 63 | Temp 97.7°F | Ht 73.0 in | Wt >= 6400 oz

## 2022-08-28 DIAGNOSIS — K5792 Diverticulitis of intestine, part unspecified, without perforation or abscess without bleeding: Secondary | ICD-10-CM

## 2022-08-28 DIAGNOSIS — K429 Umbilical hernia without obstruction or gangrene: Secondary | ICD-10-CM | POA: Diagnosis not present

## 2022-08-28 MED ORDER — TRAMADOL HCL 50 MG PO TABS: 50.0000 mg | ORAL_TABLET | Freq: Four times a day (QID) | ORAL | 0 refills | Status: DC | PRN

## 2022-08-28 MED ORDER — AMOXICILLIN-POT CLAVULANATE 875-125 MG PO TABS: 1.0000 | ORAL_TABLET | Freq: Two times a day (BID) | ORAL | 0 refills | Status: AC

## 2022-08-28 NOTE — Patient Instructions (Signed)
It was great to see you!  Continue antibiotic(s) for 3 more days -- I did send up to another full week for you  Tramadol as needed for severe pain, otherwise use tylenol  Gastroenterology referral   Keep me posted  Take care,  Jarold Motto PA-C

## 2022-09-12 ENCOUNTER — Telehealth (INDEPENDENT_AMBULATORY_CARE_PROVIDER_SITE_OTHER): Payer: BC Managed Care – PPO | Admitting: Physician Assistant

## 2022-09-12 ENCOUNTER — Encounter: Payer: Self-pay | Admitting: Physician Assistant

## 2022-09-12 VITALS — Ht 73.0 in | Wt >= 6400 oz

## 2022-09-12 DIAGNOSIS — R1084 Generalized abdominal pain: Secondary | ICD-10-CM | POA: Diagnosis not present

## 2022-09-12 NOTE — Progress Notes (Signed)
I acted as a Neurosurgeon for [employer], PA-C Corky Mull, LPN  Virtual Visit via Video Note   I, Jarold Motto, [place], connected with  Aaron Frost  ([id number], 1980-12-23) on 09/12/22 at  3:20 PM EDT by a video-enabled telemedicine application and verified that I am speaking with the correct person using two identifiers.  Location: Patient: Home Provider: [facility]   I discussed the limitations of evaluation and management by telemedicine and the availability of in person appointments. The patient expressed understanding and agreed to proceed.    History of Present Illness: Aaron Frost is a 42 y.o. who identifies as a male who was assigned male at birth, and is being seen today for abdominal pain.   Pt c/o abdominal pain again right side of abdomen, elevated temperature of 100 last night after taking tylenol, no nausea.   Diagnosed with diverticulitis on 08/21/22 in ER - was started on Augmentin and took as prescribed. Had follow-up appointment with me on 08/28/22 and was still having some generalized tenderness so we extended his Augmentin for another 3 days and also prescribed him tramadol in case pain worsened.  Since he last saw me, he took Augmentin for an additional 3 days Taking tramadol pretty rarely  A week ago, Monday approx had a [place] sandwich and maintained limited diet Additional foods included [place] burger, chicken quesadilla,   2 days ago, had a BM that was mostly solid but had some "bits" to it.  Yesterday at lunch had BM had solid that was "cloudy around the stool" and started to develop significant increase in gas; developed chills later that afternoon; body aches and had tylenol and temperature was 100 around 8p  Not aware of any COVID or flu exposures No one in his family is sick that he is aware of  Woke up again around 1:45a still having mild aches  Abdominal pain this time is different Pain is on the right side of  his abdomen but very mild  No significant pain with urination   Problems:  Patient Active Problem List   Diagnosis Date Noted   UTI (urinary tract infection) 04/03/2022   Hyperlipidemia 12/28/2021   Normocytic anemia 08/10/2017   Hyperglycemia 08/10/2017   Asthma, mild intermittent 08/09/2017   BMI 50.0-59.9, adult (HCC) 04/05/2016   Hemorrhoids 04/05/2016   Essential hypertension 04/05/2016   Gastroesophageal reflux disease 04/05/2016   Ventral hernia 12/24/2012   Pain of left heel 11/24/2011   OSA on CPAP 10/08/2011   Leg swelling 10/07/2011   Radicular pain in left arm 10/07/2011   DEPRESSION 06/10/2007   Allergic rhinitis 06/10/2007    Allergies:  Allergies  Allergen Reactions   Aleve [Naproxen] Other (See Comments)    Burning sensation and headaches   Nickel Rash   Medications:  Current Outpatient Medications:    acetaminophen (TYLENOL) 500 MG tablet, Take 500 mg by mouth every 6 (six) hours as needed for mild pain. , Disp: , Rfl:    docusate sodium (COLACE) 100 MG capsule, Take 100 mg by mouth 2 (two) times daily., Disp: , Rfl:    loratadine (CLARITIN) 10 MG tablet, Take 10 mg by mouth daily., Disp: , Rfl:    Multiple Vitamin (MULTIVITAMIN WITH MINERALS) TABS tablet, Take 1 tablet by mouth daily., Disp: , Rfl:    omeprazole (PRILOSEC OTC) 20 MG tablet, Take 20 mg by mouth daily as needed., Disp: , Rfl:    polyethylene glycol (MIRALAX / GLYCOLAX) 17 g packet,  Take 17 g by mouth daily., Disp: , Rfl:    traMADol (ULTRAM) 50 MG tablet, Take 1 tablet (50 mg total) by mouth every 6 (six) hours as needed., Disp: 20 tablet, Rfl: 0   valsartan-hydrochlorothiazide (DIOVAN-HCT) 80-12.5 MG tablet, Take 1 tablet by mouth daily., Disp: 90 tablet, Rfl: 1   Zinc 30 MG CAPS, Take 30 mg by mouth., Disp: , Rfl:   Observations/Objective: Patient is well-developed, well-nourished in no acute distress.  Resting comfortably at home.  Head is normocephalic, atraumatic.  No labored  breathing.  Speech is clear and coherent with logical content.  Patient is alert and oriented at baseline.   Assessment and Plan: 1. Generalized abdominal pain No red flags warranting ER evaluation at this time Discussed getting blood work and repeat CT abdomen/pelvis today however, he is unable to do this due to work constraints Recommend clear liquids Follow-up in office tomorrow for blood work, physical exam and possible imaging If any worsening symptom(s), needs to go to the ER  Follow Up Instructions: I discussed the assessment and treatment plan with the patient. The patient was provided an opportunity to ask questions and all were answered. The patient agreed with the plan and demonstrated an understanding of the instructions.  A copy of instructions were sent to the patient via MyChart unless otherwise noted below.   The patient was advised to call back or seek an in-person evaluation if the symptoms worsen or if the condition fails to improve as anticipated.  Jarold Motto, [place]

## 2022-09-13 ENCOUNTER — Ambulatory Visit (INDEPENDENT_AMBULATORY_CARE_PROVIDER_SITE_OTHER): Payer: BC Managed Care – PPO | Admitting: Physician Assistant

## 2022-09-13 ENCOUNTER — Encounter: Payer: Self-pay | Admitting: Physician Assistant

## 2022-09-13 VITALS — BP 130/82 | HR 65 | Temp 98.0°F | Ht 73.0 in | Wt >= 6400 oz

## 2022-09-13 DIAGNOSIS — R1084 Generalized abdominal pain: Secondary | ICD-10-CM

## 2022-09-13 DIAGNOSIS — K429 Umbilical hernia without obstruction or gangrene: Secondary | ICD-10-CM

## 2022-09-13 LAB — COMPREHENSIVE METABOLIC PANEL
ALT: 26 U/L (ref 0–53)
AST: 17 U/L (ref 0–37)
Albumin: 4.1 g/dL (ref 3.5–5.2)
Alkaline Phosphatase: 61 U/L (ref 39–117)
BUN: 6 mg/dL (ref 6–23)
CO2: 30 meq/L (ref 19–32)
Calcium: 9.4 mg/dL (ref 8.4–10.5)
Chloride: 99 meq/L (ref 96–112)
Creatinine, Ser: 0.88 mg/dL (ref 0.40–1.50)
GFR: 105.53 mL/min (ref >60.00)
Glucose, Bld: 111 mg/dL — ABNORMAL HIGH (ref 70–99)
Potassium: 3.4 meq/L — ABNORMAL LOW (ref 3.5–5.1)
Sodium: 137 meq/L (ref 135–145)
Total Bilirubin: 1.1 mg/dL (ref 0.2–1.2)
Total Protein: 7.5 g/dL (ref 6.0–8.3)

## 2022-09-13 LAB — URINALYSIS, ROUTINE W REFLEX MICROSCOPIC
Hgb urine dipstick: NEGATIVE
Ketones, ur: NEGATIVE
Nitrite: NEGATIVE
Specific Gravity, Urine: 1.02 (ref 1.000–1.030)
Urine Glucose: NEGATIVE
Urobilinogen, UA: 1 (ref 0.0–1.0)
pH: 6.5 (ref 5.0–8.0)

## 2022-09-13 LAB — CBC WITH DIFFERENTIAL/PLATELET
Basophils Absolute: 0.1 10*3/uL (ref 0.0–0.1)
Basophils Relative: 0.7 % (ref 0.0–3.0)
Eosinophils Absolute: 0.2 10*3/uL (ref 0.0–0.7)
Eosinophils Relative: 2.3 % (ref 0.0–5.0)
HCT: 46 % (ref 39.0–52.0)
Hemoglobin: 15.4 g/dL (ref 13.0–17.0)
Lymphocytes Relative: 17.6 % (ref 12.0–46.0)
Lymphs Abs: 1.5 10*3/uL (ref 0.7–4.0)
MCHC: 33.5 g/dL (ref 30.0–36.0)
MCV: 84.3 fl (ref 78.0–100.0)
Monocytes Absolute: 0.6 10*3/uL (ref 0.1–1.0)
Monocytes Relative: 7.4 % (ref 3.0–12.0)
Neutro Abs: 6.3 10*3/uL (ref 1.4–7.7)
Neutrophils Relative %: 72 % (ref 43.0–77.0)
Platelets: 248 10*3/uL (ref 150.0–400.0)
RBC: 5.45 Mil/uL (ref 4.22–5.81)
RDW: 14.5 % (ref 11.5–15.5)
WBC: 8.7 10*3/uL (ref 4.0–10.5)

## 2022-09-13 NOTE — Patient Instructions (Addendum)
It was great to see you!  I'm going to resubmit your surgery referral  Please keep me posted on your abdominal pain  I would recommend adding miralax 1 capful back in daily to see if that can help with things  If all stools are loose, consider adding benefiber to bulk up stools  Take care,  Jarold Motto PA-C

## 2022-09-13 NOTE — Progress Notes (Addendum)
Aaron Frost is a 42 y.o. male here for a follow up of a pre-existing problem.  History of Present Illness:   Chief Complaint  Patient presents with   Abdominal Pain    Gas, started mid August (ED visit), Consistent bowel movement,     Abdominal Pain: Seen yesterday via video visit for abdominal pain and elevated temperature of 100 night of 9/3. Took Tylenol prior to symptoms, denied nausea, pain with urination, or any known sick contacts. Diverticulitis diagnosed 8/13. Completed prescribed Augmentin. Followed up with PCP on 8/20 still having generalized tenderness; Augmentin extended for 3 days and 50 mg Tramadol for pain.  Maintains limited diet, foods mentioned are: [place] sandwich, [place] burger, and chicken quesadilla.  Bowel movements have been generally solid. Described BM was mostly solid with "bits" on 9/2 and solid stool surrounded by cloudiness on 9/3 shortly before following symptoms began: increase in gas soon after the BM, chills that afternoon, body aches and 100 degree temperature around 8PM  Presents today for persistence of abdominal pain contributed to gastric buildup and abnormal bowel movements.  Describes gastric buildup in epigastric region mostly and bowel movements are still solid but passing as "streamlets".  States diet is mainly liquids and crackers. Halted Miralax until GI issues moderately resolved, but has been using Colace.  Notes walking mildly relieves gastric pain.  Denies experiencing chills since 9/3.   Past Medical History:  Diagnosis Date   Asthma 08/09/2017   Hypertension    OSA on CPAP    Umbilical hernia 2016     Social History   Tobacco Use   Smoking status: Former    Current packs/day: 0.00    Average packs/day: 1.5 packs/day for 3.0 years (4.5 ttl pk-yrs)    Types: Cigarettes    Start date: 01/08/2001    Quit date: 01/09/2004    Years since quitting: 18.6   Smokeless tobacco: Never  Substance Use Topics   Alcohol use: Yes     Comment: very rare glass of wine   Drug use: No    Past Surgical History:  Procedure Laterality Date   EXTERNAL EAR SURGERY     right ear reconstruction   MOLE REMOVAL     Baco of Neck   TONSILLECTOMY  2005    Family History  Problem Relation Age of Onset   Hypertension Mother    Heart disease Father    Benign prostatic hyperplasia Father    Ovarian cancer Sister    Diabetes Maternal Grandfather    Colon cancer Paternal Grandfather 51   Heart disease Paternal Grandfather    Stroke Paternal Grandfather    Breast cancer Neg Hx     Allergies  Allergen Reactions   Aleve [Naproxen] Other (See Comments)    Burning sensation and headaches   Nickel Rash    Current Medications:   Current Outpatient Medications:    acetaminophen (TYLENOL) 500 MG tablet, Take 500 mg by mouth every 6 (six) hours as needed for mild pain. , Disp: , Rfl:    docusate sodium (COLACE) 100 MG capsule, Take 100 mg by mouth 2 (two) times daily., Disp: , Rfl:    loratadine (CLARITIN) 10 MG tablet, Take 10 mg by mouth daily., Disp: , Rfl:    Multiple Vitamin (MULTIVITAMIN WITH MINERALS) TABS tablet, Take 1 tablet by mouth daily., Disp: , Rfl:    omeprazole (PRILOSEC OTC) 20 MG tablet, Take 20 mg by mouth daily as needed., Disp: , Rfl:    traMADol (ULTRAM)  50 MG tablet, Take 1 tablet (50 mg total) by mouth every 6 (six) hours as needed., Disp: 20 tablet, Rfl: 0   valsartan-hydrochlorothiazide (DIOVAN-HCT) 80-12.5 MG tablet, Take 1 tablet by mouth daily., Disp: 90 tablet, Rfl: 1   Zinc 30 MG CAPS, Take 30 mg by mouth., Disp: , Rfl:    polyethylene glycol (MIRALAX / GLYCOLAX) 17 g packet, Take 17 g by mouth daily. (Patient not taking: Reported on 09/13/2022), Disp: , Rfl:    Review of Systems:   Review of Systems  Gastrointestinal:  Positive for abdominal pain (contributed to gastric buildup; mainly epigastric region).       (+) Abnormal bowel movements (solid stool passing in "streamlets")    Vitals:    Vitals:   09/13/22 0756  BP: 130/82  Pulse: 65  Temp: 98 F (36.7 C)  TempSrc: Temporal  SpO2: 96%  Weight: (!) 434 lb (196.9 kg)  Height: 6\' 1"  (1.854 m)     Body mass index is 57.26 kg/m.  Physical Exam:   Physical Exam Vitals and nursing note reviewed.  Constitutional:      General: He is not in acute distress.    Appearance: He is well-developed. He is not ill-appearing or toxic-appearing.  Cardiovascular:     Rate and Rhythm: Normal rate and regular rhythm.     Pulses: Normal pulses.     Heart sounds: Normal heart sounds, S1 normal and S2 normal.  Pulmonary:     Effort: Pulmonary effort is normal.     Breath sounds: Normal breath sounds.  Abdominal:     General: Abdomen is flat. Bowel sounds are normal.     Palpations: Abdomen is soft.     Tenderness: There is no abdominal tenderness.     Comments: Large umbilical hernia without significant tenderness to palpation; reducible/soft  Skin:    General: Skin is warm and dry.  Neurological:     Mental Status: He is alert.     GCS: GCS eye subscore is 4. GCS verbal subscore is 5. GCS motor subscore is 6.  Psychiatric:        Speech: Speech normal.        Behavior: Behavior normal. Behavior is cooperative.     Assessment and Plan:   Generalized abdominal pain No red flags Symptom(s) somewhat improved since yesterday Update blood work and urine Will await results and watch symptom(s) before doing CT - I do not think it is warranted at this time - he will reach out if symptom(s) change Has upcoming gastroenterology appointment next month We did discuss that some of his symptom(s) could be underlying bowel changes/constipation -- recommend miralax and potentially benefiber  Umbilical hernia without obstruction and without gangrene Refer back to surgery -- he really wants to pursue repair of this and he has recently lost weight which he was told was required to undergo this surgery   I,Emily Lagle,acting as a  scribe for [employer], PA.,have documented all relevant documentation on the behalf of Jarold Motto, PA,as directed by  Jarold Motto, PA while in the presence of Jarold Motto, [place].  I, Jarold Motto, [place], have reviewed all documentation for this visit. The documentation on 09/13/22 for the exam, diagnosis, procedures, and orders are all accurate and complete.  Jarold Motto, PA-C

## 2022-09-14 ENCOUNTER — Encounter: Payer: Self-pay | Admitting: Physician Assistant

## 2022-09-16 LAB — URINE CULTURE
MICRO NUMBER:: 15426351
SPECIMEN QUALITY:: ADEQUATE

## 2022-09-17 ENCOUNTER — Telehealth: Payer: Self-pay | Admitting: Physician Assistant

## 2022-09-17 ENCOUNTER — Other Ambulatory Visit: Payer: Self-pay | Admitting: Physician Assistant

## 2022-09-17 MED ORDER — CEPHALEXIN 500 MG PO CAPS: 500.0000 mg | ORAL_CAPSULE | Freq: Two times a day (BID) | ORAL | 0 refills | Status: DC

## 2022-09-17 NOTE — Telephone Encounter (Signed)
The patient called in to get an earlier. We did inform him that we don't have any early appointments at the moment.

## 2022-09-20 NOTE — Progress Notes (Signed)
Celso Amy, PA-C [address]  [address]  [place], [place] [address]  Main: [phone]  Fax: [phone]   Gastroenterology Consultation  Referring Provider:     Jarold Motto, [place] Primary Care Physician:  Jarold Motto, [place] Primary Gastroenterologist:  Celso Amy, PA-C / Dr. Wyline Mood   Reason for Consultation:     F/U Diverticulitis        HPI:   Aaron Frost is a 42 y.o. y/o male referred for consultation & management  by Jarold Motto, PA.    Patient went to [facility] ED 08/21/2022 to evaluate lower abdominal pain.  Abdominal pelvic CT showed wall thickening and pericolonic stranding in the transverse colon concerning for acute diverticulitis or acute focal colitis.  No perforation or abscess.  He was treated with Augmentin.  This was his first episode of diverticulitis.  CT incidentally showed large 17.3 x 10.6 cm ventral hernia with a short segment of transverse colon within the hernia.  No obstruction or inflammation.  Also incidental enlarged fatty liver.  Ventral hernia enlarged significantly compared to prior CT in 2016.  Patient has follow-up with general surgeon next week to discuss surgical repair.  Most recent lab 09/13/2022 showed normal CBC and CMP with hemoglobin 15.4, WBC 8.7.  Normal LFTs.  Recent urine culture positive for E. coli and he was started on Keflex for 7 days.  Patient has never had a colonoscopy or GI evaluation.  Family history significant for paternal grandfather who had colon cancer (age 39).  Current symptoms: Patient takes Colace and MiraLAX twice daily for constipation.  He is currently having softer bowel movement every day on this treatment.  Constipation has improved.  Still eating very soft low fiber diet.  Stools are loose and formed.  He denies rectal bleeding.  He has chronic constipation for several years.  He still has abdominal pain located in the left side and left lower quadrant.  It is dull ache, not severe.  Denies  nausea, vomiting, fever, or chills.  Past Medical History:  Diagnosis Date   Asthma 08/09/2017   Hypertension    OSA on CPAP    Umbilical hernia 2016    Past Surgical History:  Procedure Laterality Date   EXTERNAL EAR SURGERY     right ear reconstruction   MOLE REMOVAL     Baco of Neck   TONSILLECTOMY  2005    Prior to Admission medications   Medication Sig Start Date End Date Taking? Authorizing Provider  acetaminophen (TYLENOL) 500 MG tablet Take 500 mg by mouth every 6 (six) hours as needed for mild pain.     [provider]  cephALEXin (KEFLEX) 500 MG capsule Take 1 capsule (500 mg total) by mouth 2 (two) times daily. 09/17/22   Jarold Motto, PA  docusate sodium (COLACE) 100 MG capsule Take 100 mg by mouth 2 (two) times daily.    [provider]  loratadine (CLARITIN) 10 MG tablet Take 10 mg by mouth daily.    [provider]  Multiple Vitamin (MULTIVITAMIN WITH MINERALS) TABS tablet Take 1 tablet by mouth daily.    [provider]  omeprazole (PRILOSEC OTC) 20 MG tablet Take 20 mg by mouth daily as needed.    [provider]  polyethylene glycol (MIRALAX / GLYCOLAX) 17 g packet Take 17 g by mouth daily. Patient not taking: Reported on 09/13/2022    [provider]  traMADol (ULTRAM) 50 MG tablet Take 1 tablet (50 mg total)  by mouth every 6 (six) hours as needed. 08/28/22 08/28/23  Jarold Motto, PA  valsartan-hydrochlorothiazide (DIOVAN-HCT) 80-12.5 MG tablet Take 1 tablet by mouth daily. 07/20/22   Jarold Motto, PA  Zinc 30 MG CAPS Take 30 mg by mouth.    [provider]  hydrochlorothiazide 25 MG tablet Take 1 tablet (25 mg total) by mouth daily. 07/14/10 03/16/11  Norins, Rosalyn Gess, MD    Family History  Problem Relation Age of Onset   Hypertension Mother    Heart disease Father    Benign prostatic hyperplasia Father    Ovarian cancer Sister    Diabetes Maternal Grandfather    Colon cancer Paternal  Grandfather 25   Heart disease Paternal Grandfather    Stroke Paternal Grandfather    Breast cancer Neg Hx      Social History   Tobacco Use   Smoking status: Former    Current packs/day: 0.00    Average packs/day: 1.5 packs/day for 3.0 years (4.5 ttl pk-yrs)    Types: Cigarettes    Start date: 01/08/2001    Quit date: 01/09/2004    Years since quitting: 18.7   Smokeless tobacco: Never  Substance Use Topics   Alcohol use: Yes    Comment: very rare glass of wine   Drug use: No    Allergies as of 09/21/2022 - Review Complete 09/21/2022  Allergen Reaction Noted   Aleve [naproxen] Other (See Comments) 07/04/2011   Nickel Rash 04/23/2014    Review of Systems:    All systems reviewed and negative except where noted in HPI.   Physical Exam:  BP 136/88   Pulse 80   Temp 97.8 F (36.6 C)   Ht 6\' 1"  (1.854 m)   Wt (!) 434 lb 12.8 oz (197.2 kg)   BMI 57.36 kg/m  No LMP for male patient.  General:   Alert,  Well-developed, Morbidly Obese, pleasant and cooperative in NAD Lungs:  Respirations even and unlabored.  Clear throughout to auscultation.   No wheezes, crackles, or rhonchi. No acute distress. Heart:  Regular rate and rhythm; no murmurs, clicks, rubs, or gallops. Abdomen:  Normal bowel sounds.  No bruits.  Soft, and very obese without masses.  Unable to determine hepatomegaly.  There is large ventral hernia in the mid abdomen to the right and above umbilicus which is reducible and not tender.  No skin discoloration.  There is tenderness over the left mid abdomen and left lower quadrant.  Rest of abdomen is not tender..  No guarding or rebound tenderness.    Neurologic:  Alert and oriented x3;  grossly normal neurologically.  He is able to get on and off exam table with no help.  Imaging Studies: See HPI.  Assessment and Plan:   Aaron Frost is a 42 y.o. y/o male has been referred for follow-up of first episode of uncomplicated diverticulitis of the transverse  colon.  1.  Transverse colon diverticulitis, acute episode diagnosed on CT 08/21/2022.  No perforation or abscess.  Treated with Augmentin.  Currently having mild LLQ pain and tenderness.  Ordering stat abdominal pelvic CT to evaluate for persistent or recurrent diverticulitis.  If CT shows diverticulitis, then I will treat with Cipro 500 mg twice daily and Flagyl 500 mg 3 times daily x 10 days.  Schedule Colonoscopy 6 weeks after diverticulitis has resolved.  Continue low fiber diet for now.  After diverticulitis resolves then advance to high-fiber diet.  Patient education handout was given and discussed.  2.  Large ventral hernia  Keep follow-up appointment with general surgeon next week.  3.  Fatty liver disease with hepatomegaly  Recommend a low-fat diet, regular exercise, and weight loss. Patient education handout about fatty liver disease was given and discussed from up-to-date.   4.  Constipation  Continue MiraLAX 1/2 to 1 capful twice daily.  5.  Family history of colon cancer: Paternal grandfather (Age 67)  Scheduling Colonoscopy 6 weeks after diverticulitis has resolved. I discussed risks of colonoscopy with patient to include risk of bleeding, colon perforation, and risk of sedation.  Patient expressed understanding and agrees to proceed with colonoscopy.   6.  Morbid obesity: BMI 57  Encouraged Weight Loss.  Follow up will be based on above test results and GI symptoms.  Celso Amy, PA-C

## 2022-09-21 ENCOUNTER — Encounter: Payer: Self-pay | Admitting: Physician Assistant

## 2022-09-21 ENCOUNTER — Ambulatory Visit (INDEPENDENT_AMBULATORY_CARE_PROVIDER_SITE_OTHER): Payer: BC Managed Care – PPO | Admitting: Physician Assistant

## 2022-09-21 VITALS — BP 136/88 | HR 80 | Temp 97.8°F | Ht 73.0 in | Wt >= 6400 oz

## 2022-09-21 DIAGNOSIS — K439 Ventral hernia without obstruction or gangrene: Secondary | ICD-10-CM | POA: Diagnosis not present

## 2022-09-21 DIAGNOSIS — K5732 Diverticulitis of large intestine without perforation or abscess without bleeding: Secondary | ICD-10-CM | POA: Diagnosis not present

## 2022-09-21 DIAGNOSIS — K760 Fatty (change of) liver, not elsewhere classified: Secondary | ICD-10-CM

## 2022-09-21 DIAGNOSIS — K5900 Constipation, unspecified: Secondary | ICD-10-CM

## 2022-09-21 DIAGNOSIS — R160 Hepatomegaly, not elsewhere classified: Secondary | ICD-10-CM

## 2022-09-21 DIAGNOSIS — K5904 Chronic idiopathic constipation: Secondary | ICD-10-CM

## 2022-09-21 DIAGNOSIS — K5792 Diverticulitis of intestine, part unspecified, without perforation or abscess without bleeding: Secondary | ICD-10-CM

## 2022-09-21 DIAGNOSIS — Z8719 Personal history of other diseases of the digestive system: Secondary | ICD-10-CM

## 2022-09-21 DIAGNOSIS — Z800 Family history of malignant neoplasm of digestive organs: Secondary | ICD-10-CM

## 2022-09-21 DIAGNOSIS — Z6841 Body Mass Index (BMI) 40.0 and over, adult: Secondary | ICD-10-CM

## 2022-09-21 DIAGNOSIS — R1032 Left lower quadrant pain: Secondary | ICD-10-CM

## 2022-09-21 DIAGNOSIS — R933 Abnormal findings on diagnostic imaging of other parts of digestive tract: Secondary | ICD-10-CM

## 2022-09-21 MED ORDER — PEG 3350-KCL-NA BICARB-NACL 420 G PO SOLR: 4000.0000 mL | Freq: Once | ORAL | 0 refills | Status: AC

## 2022-09-21 NOTE — Patient Instructions (Signed)
CT scheduled 09/25/22 @ 1:45 pm [facility]. [facility].Nothing to eat/drink after 12 noon. Follow up based on results of CT scan.

## 2022-09-24 ENCOUNTER — Encounter: Payer: Self-pay | Admitting: Surgery

## 2022-09-24 ENCOUNTER — Ambulatory Visit (INDEPENDENT_AMBULATORY_CARE_PROVIDER_SITE_OTHER): Payer: BC Managed Care – PPO | Admitting: Surgery

## 2022-09-24 VITALS — BP 143/92 | HR 76 | Temp 97.9°F | Ht 73.0 in | Wt >= 6400 oz

## 2022-09-24 DIAGNOSIS — K436 Other and unspecified ventral hernia with obstruction, without gangrene: Secondary | ICD-10-CM | POA: Diagnosis not present

## 2022-09-24 NOTE — Patient Instructions (Addendum)
If you have any concerns or questions, please feel free to call our office. Call our office when you lose 20 pounds and after your colonoscopy.   Umbilical Hernia, Adult  A hernia is a lump of tissue that pushes through an opening in the muscles. An umbilical hernia happens in the belly, near the belly button. The hernia may contain tissues from the small or large intestine. It may also have fatty tissue that covers the intestines. Umbilical hernias in adults may get worse over time. They need to be treated with surgery. There are several types of umbilical hernias. They include: Indirect hernia. This occurs just above or below the belly button. It's the most common type of umbilical hernia in adults. Direct hernia. This type occurs in an opening that's formed by the belly button. Reducible hernia. This hernia comes and goes. You may see it only when you strain, cough, or lift something heavy. This type of hernia can be pushed back into the belly (reduced). Incarcerated hernia. This traps the hernia in the wall of the belly. This type of hernia can't be pushed back into the belly. It can cause a strangulated hernia. Strangulated hernia. This hernia cuts off blood flow to the tissues inside the hernia. The tissues can die if this happens. This type of hernia must be treated right away. What are the causes? An umbilical hernia happens when tissue inside the belly pushes through an opening in the muscles of the belly. What increases the risk? You're more likely to get this hernia if: You strain while lifting or pushing heavy objects. You've had several pregnancies. You have a condition that puts pressure on your belly, and you've had it for a long time. These include: Obesity. A buildup of fluid inside your belly. Vomiting or coughing all the time. Trouble pooping (constipation). You've had surgery that weakened the muscles in the belly. What are the signs or symptoms? The main symptom of this  condition is a bulge at the belly button or near it. The bulge does not cause pain. Other symptoms depend on the type of hernia you have. A reducible hernia may be seen only when you strain, cough, or lift something heavy. Other symptoms may include: Dull pain. A feeling of pressure. An incarcerated hernia may cause very bad pain. Also, you may: Vomit or feel like you may vomit. Not be able to pass gas. A strangulated hernia may cause: Pain that gets worse and worse. Vomiting, or feeling like you may vomit. Pain when you press on the hernia. Change of color on the skin over the hernia. The skin may become red or purple. Trouble pooping. Blood in the poop. How is this diagnosed? This condition may be diagnosed based on: Your symptoms and medical history. A physical exam. You may be asked to cough or strain while standing. These actions will put pressure inside your belly. The pressure can force the hernia through the opening in your muscles. Your health care provider may try to push the hernia back into your belly (reduce). How is this treated? Surgery is the only treatment for an umbilical hernia. Surgery for a strangulated hernia must be done right away. If you have a small hernia that's not incarcerated, you may need to lose weight before the surgery is done. Follow these instructions at home: Managing constipation You may need to take these actions to prevent trouble pooping. This will help to prevent straining. Drink enough fluid to keep your pee (urine) pale yellow.  Take over-the-counter or prescription medicines. Eat foods that are high in fiber, such as beans, whole grains, and fresh fruits and vegetables. Limit foods that are high in fat and sugars, such as fried or sweet foods. General instructions Do not try to push the hernia back in. Lose weight, if told by your provider. Watch your hernia for any changes in color or size. Tell your provider if any changes occur. You  may need to avoid activities that put pressure on your hernia. You may have to avoid lifting. Ask your provider how much you can safely lift. Take over-the-counter and prescription medicines only as told by your provider. Contact a health care provider if: Your hernia gets larger or feels hard. Your hernia becomes painful. You get a fever or chills. Get help right away if: You get very bad pain near the area of the hernia, and the pain comes on suddenly. You have pain and you vomit or feel like you may vomit. The skin over your hernia changes color. These symptoms may be an emergency. Get help right away. Call 911. Do not wait to see if the symptoms go away. Do not drive yourself to the hospital. This information is not intended to replace advice given to you by your health care provider. Make sure you discuss any questions you have with your health care provider. Document Revised: 04/17/2022 Document Reviewed: 04/17/2022 Elsevier Patient Education  2024 [employer].

## 2022-09-25 ENCOUNTER — Ambulatory Visit
Admission: RE | Admit: 2022-09-25 | Discharge: 2022-09-25 | Disposition: A | Discharge status: Home / Self Care | Payer: BC Managed Care – PPO | Source: Ambulatory Visit | Attending: Physician Assistant | Admitting: Physician Assistant

## 2022-09-25 DIAGNOSIS — K439 Ventral hernia without obstruction or gangrene: Secondary | ICD-10-CM | POA: Diagnosis not present

## 2022-09-25 DIAGNOSIS — K5732 Diverticulitis of large intestine without perforation or abscess without bleeding: Secondary | ICD-10-CM | POA: Diagnosis not present

## 2022-09-25 DIAGNOSIS — Z8719 Personal history of other diseases of the digestive system: Secondary | ICD-10-CM | POA: Insufficient documentation

## 2022-09-25 DIAGNOSIS — K760 Fatty (change of) liver, not elsewhere classified: Secondary | ICD-10-CM | POA: Diagnosis not present

## 2022-09-25 MED ORDER — IOHEXOL 350 MG/ML SOLN
100.0000 mL | Freq: Once | INTRAVENOUS | Status: AC | PRN
  Administered 2022-09-25: 100 mL via INTRAVENOUS

## 2022-09-25 NOTE — Progress Notes (Signed)
Patient ID: Aaron Frost, male   DOB: 09/02/1980, 42 y.o.   MRN: [id number]  HPI Aaron Frost is a 42 y.o. male seen in consultation at the request of Dr. Cyril Frost.  He recently had a visit to the emergency room about 4 weeks ago for severe abdominal pain that was located in the mid abdomen.  At that time a CT scan was performed that have personally reviewed showing evidence of around 5.5's ventral hernia defect and there was evidence of diverticulitis with in the colon outside the hernia sac.  The hernia sac does contain transverse colon.  There is no evidence of perforation or abscess. Also note that I have compared to another imaging studies 8 years ago and there has significant increase in the size of the ventral hernia.  At that time the hernia only contained fat and now it contains transverse colon. He is states that he actually has lost about 40 pounds.  He is currently BMI of 56.7 and his current weight is 429 pounds.  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  He is not diabetic he is not a smoker. He states th that he saw a Careers adviser a few years back and the surgeon told him that he needed to lose about 60 to 70 pounds.  Since then he has lost approximately 40 pounds. Currently he experiences intermittent pain around the hernia sac that is sharp worsening with Valsalva.  It is moderate in intensity and over the last few weeks has intensified if in severity.  HE Did have a current CBC showing mild increase in the white count of 11.9 normal hemoglobin platelets and a normal CMP. Initial blood pressure today was very high on the diastolic being in the 120.  Repeat blood pressure was better. HE has tried [place] and had a lot of side effects. HE is leery of trying more semaglutide analogs due to family history of GI issues and unknown personal GI issues  Upon further review of medical record it looks like he did have a CT scan in 2016 and at that time he was 360 pounds.  I do not have  records from prior General Surgery note but I do think it was right around that time when he was 360 pounds.  He was 460 lbs  on 05/2021 He was 457 lbs on 12/2020 He was 428 lbs on  09/2018  HPI  Past Medical History:  Diagnosis Date   Acute diverticulitis 08/2022   Transverse Colon; NO abscess or Perforation   Asthma 08/09/2017   Fatty liver    Hypertension    Morbid obesity (HCC)    OSA on CPAP    Umbilical hernia 2016   Ventral hernia without obstruction or gangrene 08/2022   Large    Past Surgical History:  Procedure Laterality Date   EXTERNAL EAR SURGERY     right ear reconstruction   MOLE REMOVAL     Baco of Neck   TONSILLECTOMY  2005    Family History  Problem Relation Age of Onset   Hypertension Mother    Heart disease Father    Benign prostatic hyperplasia Father    Ovarian cancer Sister    Diabetes Maternal Grandfather    Colon cancer Paternal Grandfather 43   Heart disease Paternal Grandfather    Stroke Paternal Grandfather    Breast cancer Neg Hx     Social History Social History   Tobacco Use   Smoking status: Former  Current packs/day: 0.00    Average packs/day: 1.5 packs/day for 3.0 years (4.5 ttl pk-yrs)    Types: Cigarettes    Start date: 01/08/2001    Quit date: 01/09/2004    Years since quitting: 18.7   Smokeless tobacco: Never  Substance Use Topics   Alcohol use: Yes    Comment: very rare glass of wine   Drug use: No    Allergies  Allergen Reactions   Aleve [Naproxen] Other (See Comments)    Burning sensation and headaches   Nickel Rash    Current Outpatient Medications  Medication Sig Dispense Refill   acetaminophen (TYLENOL) 500 MG tablet Take 500 mg by mouth every 6 (six) hours as needed for mild pain.      cephALEXin (KEFLEX) 500 MG capsule Take 1 capsule (500 mg total) by mouth 2 (two) times daily. 14 capsule 0   docusate sodium (COLACE) 100 MG capsule Take 100 mg by mouth 2 (two) times daily.     Multiple Vitamin  (MULTIVITAMIN WITH MINERALS) TABS tablet Take 1 tablet by mouth daily.     omeprazole (PRILOSEC OTC) 20 MG tablet Take 20 mg by mouth daily as needed.     polyethylene glycol (MIRALAX / GLYCOLAX) 17 g packet Take 17 g by mouth daily.     traMADol (ULTRAM) 50 MG tablet Take 1 tablet (50 mg total) by mouth every 6 (six) hours as needed. 20 tablet 0   valsartan-hydrochlorothiazide (DIOVAN-HCT) 80-12.5 MG tablet Take 1 tablet by mouth daily. 90 tablet 1   Zinc 30 MG CAPS Take 30 mg by mouth.     No current facility-administered medications for this visit.     Review of Systems Full ROS  was asked and was negative except for the information on the HPI  Physical Exam Blood pressure (!) 143/92, pulse 76, temperature 97.9 F (36.6 C), temperature source Oral, height 6\' 1"  (1.854 m), weight (!) 429 lb 12.8 oz (195 kg), SpO2 98%. CONSTITUTIONAL: NAD. EYES: Pupils are equal, round, and reactive to light, Sclera are non-icteric. EARS, NOSE, MOUTH AND THROAT: The oropharynx is clear. The oral mucosa is pink and moist. Hearing is intact to voice. LYMPH NODES:  Lymph nodes in the neck are normal. RESPIRATORY:  Lungs are clear. There is normal respiratory effort, with equal breath sounds bilaterally, and without pathologic use of accessory muscles. CARDIOVASCULAR: Heart is regular without murmurs, gallops, or rubs. GI: The abdomen is  soft, Large panus, there is evidence of a chronically incarcerated ventral hernia that measures approximately 6 cm in diameter.  There are no palpable masses. There is no hepatosplenomegaly.  GU: Rectal deferred.   MUSCULOSKELETAL: Normal muscle strength and tone. No cyanosis or edema.   SKIN: Turgor is good and there are no pathologic skin lesions or ulcers. NEUROLOGIC: Motor and sensation is grossly normal. Cranial nerves are grossly intact. PSYCH:  Oriented to person, place and time. Affect is normal.  Data Reviewed  I have personally reviewed the patient's imaging,  laboratory findings and medical records.    Assessment/Plan 42 year old male with symptomatic and crescendo ventral hernia that is at least 6 cm with segment of large bowel within the sac.  He does have significant symptoms and is impairing his quality of life.  Was recent visit to the ED for may be some diverticulitis, he feels a little bit better after antibiotic was given. He is BMI is 56.  I had an extensive discussion with the patient regarding weight optimization.  Apparently  he was told by a surgeon that if he got too close to 400 they will do elective surgery.  He has lost close to 40 pounds since he used to be around 470. His blood pressure also needs to be optimized as well, advice him to seek advice from PCP. I was very candid with him and also told him that he will be at high risk for perioperative morbidity as well as wound complications and hernia recurrences.  At a minimum I will want him to be close to 400 pounds and also have better optimization of blood pressure. HE Does have a chronically incarcerated hernia and does not need emergent surgical intervention.  He understands that he is between a rock and a hard place because he has significant symptoms but at the same time he is not an optimal surgical candidate for elective repair I also offer him for dietitian referral and also for weight management referral to potentially include bariatric surgery.  At this time he declines those options. At the end of the visit he says that he will call me back if he is able to achieve at least close to 400 pound target. Please note the spent 60 minutes in this encounter including person reviewing images, counseling the patient, placing orders and performing documentation.    Sterling Big, MD FACS General Surgeon 09/25/2022, 7:22 AM

## 2022-09-26 ENCOUNTER — Telehealth: Payer: Self-pay

## 2022-09-26 NOTE — Progress Notes (Signed)
Call and notify patient abdominal pelvic CT with contrast shows: 1.  Previous diverticulitis has resolved.  No evidence of current diverticulitis or infection. 2.  Stable large ventral hernia containing fat.  No obstruction. 3.  Fatty liver. **Patient does not need any more antibiotics.  Continue with plan for colonoscopy on 11/07/2022 as scheduled. Celso Amy, PA-C

## 2022-09-26 NOTE — Telephone Encounter (Signed)
Patient notified.  1.  Previous diverticulitis has resolved.  No evidence of current diverticulitis or infection.  2.  Stable large ventral hernia containing fat.  No obstruction.  3.  Fatty liver.  **Patient does not need any more antibiotics.  Continue with plan for colonoscopy on 11/07/2022 as scheduled.  Celso Amy, PA-C

## 2022-09-28 ENCOUNTER — Telehealth: Payer: Self-pay | Admitting: Physician Assistant

## 2022-09-28 NOTE — Telephone Encounter (Signed)
Patient states [employer] will be contacting PCP (may be sending a form to be filled out) re: Accommodations for bathroom breaks. States employer did not accept Letter of Accommodations from PCP.

## 2022-10-01 NOTE — Telephone Encounter (Signed)
Spoke to pt told him I have not received form yet. Pt said he just spoke to them on Friday and should be sending it this week sometime. Told him okay, I will keep an eye out for it. Pt verbalized understanding.

## 2022-10-04 ENCOUNTER — Telehealth: Payer: Self-pay | Admitting: Physician Assistant

## 2022-10-04 NOTE — Telephone Encounter (Signed)
Fax received document:  Medical verification form , to be filled out by provider. Patient requested to send it back via Fax within 5-days. Document is located in providers tray at front office.Please advise at Mobile [phone] (mobile)

## 2022-10-05 ENCOUNTER — Encounter: Payer: Self-pay | Admitting: Physician Assistant

## 2022-10-05 NOTE — Telephone Encounter (Signed)
Received form, see other message.

## 2022-10-09 ENCOUNTER — Encounter: Payer: Self-pay | Admitting: *Deleted

## 2022-10-09 NOTE — Telephone Encounter (Signed)
Forms completed and faxed to [employer] at [phone].

## 2022-10-09 NOTE — Telephone Encounter (Signed)
Left message on voicemail to call office.  

## 2022-10-12 NOTE — Telephone Encounter (Signed)
Brooke from [employer] called stating they received forms but they needed either a description of symptoms or medical facts as it relates to the paperwork given to them. States this can be done verbally by calling [phone].   Claim number:[id number]

## 2022-10-12 NOTE — Telephone Encounter (Signed)
Received a second copy of these forms via fax . Placed in providers folder .

## 2022-10-18 ENCOUNTER — Encounter: Payer: Self-pay | Admitting: Physician Assistant

## 2022-10-18 ENCOUNTER — Ambulatory Visit: Payer: BC Managed Care – PPO | Admitting: Physician Assistant

## 2022-10-22 ENCOUNTER — Ambulatory Visit: Payer: BC Managed Care – PPO | Admitting: Physician Assistant

## 2022-10-31 ENCOUNTER — Ambulatory Visit
Admission: RE | Admit: 2022-10-31 | Discharge: 2022-10-31 | Disposition: A | Discharge status: Home / Self Care | Payer: BC Managed Care – PPO | Source: Ambulatory Visit | Attending: Physician Assistant | Admitting: Physician Assistant

## 2022-10-31 ENCOUNTER — Telehealth: Payer: Self-pay

## 2022-10-31 ENCOUNTER — Telehealth: Payer: Self-pay | Admitting: Physician Assistant

## 2022-10-31 ENCOUNTER — Other Ambulatory Visit: Payer: Self-pay

## 2022-10-31 DIAGNOSIS — Z8719 Personal history of other diseases of the digestive system: Secondary | ICD-10-CM

## 2022-10-31 DIAGNOSIS — K439 Ventral hernia without obstruction or gangrene: Secondary | ICD-10-CM | POA: Diagnosis not present

## 2022-10-31 DIAGNOSIS — K5792 Diverticulitis of intestine, part unspecified, without perforation or abscess without bleeding: Secondary | ICD-10-CM | POA: Diagnosis not present

## 2022-10-31 DIAGNOSIS — R109 Unspecified abdominal pain: Secondary | ICD-10-CM | POA: Diagnosis not present

## 2022-10-31 DIAGNOSIS — K5730 Diverticulosis of large intestine without perforation or abscess without bleeding: Secondary | ICD-10-CM | POA: Diagnosis not present

## 2022-10-31 LAB — COMPREHENSIVE METABOLIC PANEL
ALT: 27 [IU]/L (ref 0–44)
AST: 20 [IU]/L (ref 0–40)
Albumin: 4.3 g/dL (ref 4.1–5.1)
Alkaline Phosphatase: 73 [IU]/L (ref 44–121)
BUN/Creatinine Ratio: 11 (ref 9–20)
BUN: 9 mg/dL (ref 6–24)
Bilirubin Total: 0.7 mg/dL (ref 0.0–1.2)
CO2: 27 mmol/L (ref 20–29)
Calcium: 9 mg/dL (ref 8.7–10.2)
Chloride: 96 mmol/L (ref 96–106)
Creatinine, Ser: 0.82 mg/dL (ref 0.76–1.27)
Globulin, Total: 2.4 g/dL (ref 1.5–4.5)
Glucose: 96 mg/dL (ref 70–99)
Potassium: 3.9 mmol/L (ref 3.5–5.2)
Sodium: 136 mmol/L (ref 134–144)
Total Protein: 6.7 g/dL (ref 6.0–8.5)
eGFR: 112 mL/min/{1.73_m2} (ref >59)

## 2022-10-31 LAB — CBC WITH DIFFERENTIAL/PLATELET
Basophils Absolute: 0.1 10*3/uL (ref 0.0–0.2)
Basos: 1 %
EOS (ABSOLUTE): 0.2 10*3/uL (ref 0.0–0.4)
Eos: 2 %
Hematocrit: 47.8 % (ref 37.5–51.0)
Hemoglobin: 15.7 g/dL (ref 13.0–17.7)
Immature Grans (Abs): 0.1 10*3/uL (ref 0.0–0.1)
Immature Granulocytes: 1 %
Lymphocytes Absolute: 1.8 10*3/uL (ref 0.7–3.1)
Lymphs: 18 %
MCH: 28.1 pg (ref 26.6–33.0)
MCHC: 32.8 g/dL (ref 31.5–35.7)
MCV: 86 fL (ref 79–97)
Monocytes Absolute: 0.6 10*3/uL (ref 0.1–0.9)
Monocytes: 6 %
Neutrophils Absolute: 7.1 10*3/uL — ABNORMAL HIGH (ref 1.4–7.0)
Neutrophils: 72 %
Platelets: 279 10*3/uL (ref 150–450)
RBC: 5.59 x10E6/uL (ref 4.14–5.80)
RDW: 13.5 % (ref 11.6–15.4)
WBC: 9.8 10*3/uL (ref 3.4–10.8)

## 2022-10-31 MED ORDER — IOHEXOL 300 MG/ML  SOLN
100.0000 mL | Freq: Once | INTRAMUSCULAR | Status: AC | PRN
  Administered 2022-10-31: 100 mL via INTRAVENOUS

## 2022-10-31 NOTE — Telephone Encounter (Signed)
-----   Message from [facility] [place] W sent at 10/31/2022 10:28 AM EDT ----- Regarding: Stat CT Hey this patient is having stat CT today. Thanks.

## 2022-10-31 NOTE — Telephone Encounter (Signed)
Patient return my call and he emailed me a card and scanned it in the chart. Called the number on the back of the card and prior authorization is not required for CT scan. Reference number Pollyann Glen B 10/31/2022

## 2022-10-31 NOTE — Telephone Encounter (Signed)
Patient [employer] insurance card was last scanned in on 02/16/2019. Patient was seen on 09/21/2022 and current insurance card was not scanned into the chart. Went to [employer] and they state. The following solutions for the service date entered do not require Pre-Authorization by [employer]. Please note that benefit limits, if applicable, will still be applied. Contact the health plan using the number on the back of the member's ID card if you have any questions regarding coverage or Pre-Authorization requirements this is above diagnostic imaging. Tried to call the number on the back of the card and it states that starting 01/08/2022 there would be a new number on the member card that providers needed to call. Tried to call patient but had to leave a message for call back to get current insurance card.

## 2022-10-31 NOTE — Telephone Encounter (Signed)
CT Abdomen/Pelvis scheduled STAT. [facility]- Patient will come to office for lab work after CT scan.

## 2022-10-31 NOTE — Telephone Encounter (Signed)
Patient would like to speak wit Aaron Frost- recent episode of Diverticulitis and is scheduled for colonoscopy 11/07/22- having abdominal pain -denies fever- bowel movement today was small and little loose.Please call patient.

## 2022-10-31 NOTE — Telephone Encounter (Signed)
PT requesting earlier appointment having problems with bowels had to cancel procedure

## 2022-11-01 ENCOUNTER — Telehealth: Payer: Self-pay

## 2022-11-01 ENCOUNTER — Encounter: Payer: Self-pay | Admitting: Gastroenterology

## 2022-11-01 NOTE — Telephone Encounter (Signed)
Patient notified.     Please call and notify patient labs show normal CBC and CMP.  White count, hemoglobin, kidney test, liver test, and electrolytes are all normal.  This is great news!  Continue with current plan for colonoscopy.  Okay to increase fiber in diet as discussed.   Please call and notify patient abdominal pelvic CT shows no evidence of diverticulitis.  This is great news!  Large ventral hernia has not changed since previous CT.  No acute abnormality.  Continue with plan for colonoscopy as scheduled.

## 2022-11-01 NOTE — Progress Notes (Signed)
Please call and notify patient abdominal pelvic CT shows no evidence of diverticulitis.  This is great news!  Large ventral hernia has not changed since previous CT.  No acute abnormality.  Continue with plan for colonoscopy as scheduled.

## 2022-11-01 NOTE — Progress Notes (Signed)
Please call and notify patient labs show normal CBC and CMP.  White count, hemoglobin, kidney test, liver test, and electrolytes are all normal.  This is great news!  Continue with current plan for colonoscopy.  Okay to increase fiber in diet as discussed.

## 2022-11-05 ENCOUNTER — Encounter: Payer: Self-pay | Admitting: Physician Assistant

## 2022-11-07 ENCOUNTER — Ambulatory Visit: Payer: BC Managed Care – PPO | Admitting: Anesthesiology

## 2022-11-07 ENCOUNTER — Ambulatory Visit
Admission: RE | Admit: 2022-11-07 | Discharge: 2022-11-07 | Disposition: A | Discharge status: Home / Self Care | Payer: BC Managed Care – PPO | Attending: Gastroenterology | Admitting: Gastroenterology

## 2022-11-07 ENCOUNTER — Encounter
Admission: RE | Disposition: A | Discharge status: Home / Self Care | Payer: Self-pay | Source: Home / Self Care | Attending: Gastroenterology

## 2022-11-07 ENCOUNTER — Encounter: Payer: Self-pay | Admitting: Gastroenterology

## 2022-11-07 ENCOUNTER — Other Ambulatory Visit: Payer: Self-pay

## 2022-11-07 DIAGNOSIS — Z87891 Personal history of nicotine dependence: Secondary | ICD-10-CM | POA: Insufficient documentation

## 2022-11-07 DIAGNOSIS — K5732 Diverticulitis of large intestine without perforation or abscess without bleeding: Secondary | ICD-10-CM

## 2022-11-07 DIAGNOSIS — Z6841 Body Mass Index (BMI) 40.0 and over, adult: Secondary | ICD-10-CM | POA: Diagnosis not present

## 2022-11-07 DIAGNOSIS — I10 Essential (primary) hypertension: Secondary | ICD-10-CM | POA: Diagnosis not present

## 2022-11-07 DIAGNOSIS — K469 Unspecified abdominal hernia without obstruction or gangrene: Secondary | ICD-10-CM | POA: Insufficient documentation

## 2022-11-07 DIAGNOSIS — Z8719 Personal history of other diseases of the digestive system: Secondary | ICD-10-CM

## 2022-11-07 DIAGNOSIS — G4730 Sleep apnea, unspecified: Secondary | ICD-10-CM | POA: Insufficient documentation

## 2022-11-07 DIAGNOSIS — K5730 Diverticulosis of large intestine without perforation or abscess without bleeding: Secondary | ICD-10-CM | POA: Insufficient documentation

## 2022-11-07 DIAGNOSIS — R1032 Left lower quadrant pain: Secondary | ICD-10-CM

## 2022-11-07 DIAGNOSIS — J4520 Mild intermittent asthma, uncomplicated: Secondary | ICD-10-CM | POA: Diagnosis not present

## 2022-11-07 DIAGNOSIS — K56699 Other intestinal obstruction unspecified as to partial versus complete obstruction: Secondary | ICD-10-CM | POA: Insufficient documentation

## 2022-11-07 DIAGNOSIS — K5792 Diverticulitis of intestine, part unspecified, without perforation or abscess without bleeding: Secondary | ICD-10-CM | POA: Diagnosis not present

## 2022-11-07 HISTORY — PX: COLONOSCOPY WITH PROPOFOL: SHX5780

## 2022-11-07 SURGERY — COLONOSCOPY WITH PROPOFOL
Anesthesia: General

## 2022-11-07 MED ORDER — SODIUM CHLORIDE 0.9 % IV SOLN
INTRAVENOUS | Status: DC
Start: 2022-11-07 — End: 2022-11-07

## 2022-11-07 MED ORDER — PROPOFOL 500 MG/50ML IV EMUL
INTRAVENOUS | Status: DC | PRN
  Administered 2022-11-07: 125 ug/kg/min via INTRAVENOUS

## 2022-11-07 MED ORDER — PROPOFOL 1000 MG/100ML IV EMUL
INTRAVENOUS | Status: AC
  Filled 2022-11-07: qty 100

## 2022-11-07 MED ORDER — LIDOCAINE HCL (CARDIAC) PF 100 MG/5ML IV SOSY
PREFILLED_SYRINGE | INTRAVENOUS | Status: DC | PRN
  Administered 2022-11-07: 50 mg via INTRAVENOUS

## 2022-11-07 MED ORDER — LIDOCAINE HCL (PF) 2 % IJ SOLN
INTRAMUSCULAR | Status: AC
Start: 2022-11-07 — End: ?
  Filled 2022-11-07: qty 5

## 2022-11-07 MED ORDER — PROPOFOL 10 MG/ML IV BOLUS
INTRAVENOUS | Status: DC | PRN
  Administered 2022-11-07 (×2): 20 mg via INTRAVENOUS
  Administered 2022-11-07: 60 mg via INTRAVENOUS

## 2022-11-07 MED ORDER — DEXMEDETOMIDINE HCL IN NACL 80 MCG/20ML IV SOLN
INTRAVENOUS | Status: DC | PRN
  Administered 2022-11-07: 12 ug via INTRAVENOUS

## 2022-11-07 NOTE — Transfer of Care (Signed)
Immediate Anesthesia Transfer of Care Note  Patient: Aaron Frost  Procedure(s) Performed: COLONOSCOPY WITH PROPOFOL  Patient Location: PACU  Anesthesia Type:General  Level of Consciousness: awake, alert , and oriented  Airway & Oxygen Therapy: Patient Spontanous Breathing  Post-op Assessment: Report given to RN and Post -op Vital signs reviewed and stable  Post vital signs: Reviewed and stable  Last Vitals:  Vitals Value Taken Time  BP 104/69 11/07/22 0922  Temp    Pulse 61 11/07/22 0923  Resp 18 11/07/22 0923  SpO2 97 % 11/07/22 0923  Vitals shown include unfiled device data.  Last Pain:  Vitals:   11/07/22 0849  TempSrc: Temporal  PainSc: 0-No pain         Complications: No notable events documented.

## 2022-11-07 NOTE — Anesthesia Preprocedure Evaluation (Signed)
Anesthesia Evaluation  Patient identified by MRN, date of birth, ID band Patient awake    Reviewed: Allergy & Precautions, H&P , NPO status , Patient's Chart, lab work & pertinent test results  Airway Mallampati: III  TM Distance: >3 FB Neck ROM: full    Dental no notable dental hx.    Pulmonary asthma , sleep apnea , former smoker   Pulmonary exam normal        Cardiovascular hypertension, Normal cardiovascular exam     Neuro/Psych negative neurological ROS  negative psych ROS   GI/Hepatic Neg liver ROS,GERD  Controlled,,  Endo/Other    Morbid obesity  Renal/GU negative Renal ROS  negative genitourinary   Musculoskeletal   Abdominal  (+) + obese  Peds  Hematology negative hematology ROS (+)   Anesthesia Other Findings Past Medical History: 08/2022: Acute diverticulitis     Comment:  Transverse Colon; NO abscess or Perforation 08/09/2017: Asthma No date: Fatty liver No date: Hypertension No date: Morbid obesity (HCC) No date: OSA on CPAP 2016: Umbilical hernia 08/2022: Ventral hernia without obstruction or gangrene     Comment:  Large  Past Surgical History: No date: EXTERNAL EAR SURGERY     Comment:  right ear reconstruction No date: MOLE REMOVAL     Comment:  Baco of Neck 2005: TONSILLECTOMY     Reproductive/Obstetrics negative OB ROS                              Anesthesia Physical Anesthesia Plan  ASA: 3  Anesthesia Plan: General   Post-op Pain Management:    Induction: Intravenous  PONV Risk Score and Plan: Propofol infusion and TIVA  Airway Management Planned: Natural Airway  Additional Equipment:   Intra-op Plan:   Post-operative Plan:   Informed Consent:      Dental Advisory Given  Plan Discussed with: CRNA and Surgeon  Anesthesia Plan Comments:          Anesthesia Quick Evaluation

## 2022-11-07 NOTE — Anesthesia Postprocedure Evaluation (Signed)
Anesthesia Post Note  Patient: Aaron Frost  Procedure(s) Performed: COLONOSCOPY WITH PROPOFOL  Patient location during evaluation: Endoscopy Anesthesia Type: General Level of consciousness: awake and alert Pain management: pain level controlled Vital Signs Assessment: post-procedure vital signs reviewed and stable Respiratory status: spontaneous breathing, nonlabored ventilation and respiratory function stable Cardiovascular status: blood pressure returned to baseline and stable Postop Assessment: no apparent nausea or vomiting Anesthetic complications: no   No notable events documented.   Last Vitals:  Vitals:   11/07/22 0849 11/07/22 0922  BP: 132/78 104/69  Pulse: (!) 52   Resp: 16 (!) 24  Temp: (!) 36 C (!) 36.1 C  SpO2: 98%     Last Pain:  Vitals:   11/07/22 0932  TempSrc:   PainSc: 0-No pain                 Foye Deer

## 2022-11-07 NOTE — Op Note (Signed)
[facility] Gastroenterology Patient Name: Aaron Frost Procedure Date: 11/07/2022 8:27 AM MRN: [id number] Account #: [id number] Date of Birth: 1980/11/24 Admit Type: Outpatient Age: 42 Room: [facility] ENDO ROOM 3 Gender: Male Note Status: Finalized Instrument Name: Colonoscope [id number] Procedure:             Colonoscopy Indications:           Follow-up of diverticulitis Providers:             Wyline Mood MD, MD Referring MD:          Jarold Motto (Referring MD) Medicines:             Monitored Anesthesia Care Complications:         No immediate complications. Procedure:             Pre-Anesthesia Assessment:                        - Prior to the procedure, a History and Physical was                         performed, and patient medications, allergies and                         sensitivities were reviewed. The patient's tolerance                         of previous anesthesia was reviewed.                        - The risks and benefits of the procedure and the                         sedation options and risks were discussed with the                         patient. All questions were answered and informed                         consent was obtained.                        - ASA Grade Assessment: II - A patient with mild                         systemic disease.                        After obtaining informed consent, the colonoscope was                         passed under direct vision. Throughout the procedure,                         the patient's blood pressure, pulse, and oxygen                         saturations were monitored continuously. The                         Colonoscope was introduced through the anus  with the                         intention of advancing to the cecum. The scope was                         advanced to the transverse colon before the procedure                         was aborted. Medications were given. The colonoscopy                          was performed without difficulty. The patient                         tolerated the procedure well. The quality of the bowel                         preparation was good. No anatomical landmarks were                         photographed. Findings:      A benign-appearing, intrinsic stenosis was found in the transverse colon       and was non-traversed. 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                        - No specimens collected. Recommendation:        - Discharge patient to home (with escort).                        - Resume previous diet.                        - Continue present medications.                        - Unable to reach cecum due to herniation of colon                         loops causing stenosis, will need repeat colonoscopy                         after hernia repair Procedure Code(s):     --- Professional ---                        [id number], 53, Colonoscopy, flexible; diagnostic,                         including collection of specimen(s) by brushing or                         washing, when performed (separate procedure) Diagnosis Code(s):     --- Professional ---                        U04.540, Other intestinal obstruction unspecified as  to partial versus complete obstruction                        K57.32, Diverticulitis of large intestine without                         perforation or abscess without bleeding CPT copyright 2022 American Medical Association. All rights reserved. The codes documented in this report are preliminary and upon coder review may  be revised to meet current compliance requirements. Wyline Mood, MD Wyline Mood MD, MD 11/07/2022 9:21:16 AM This report has been signed electronically. Number of Addenda: 0 Note Initiated On: 11/07/2022 8:27 AM Total Procedure Duration: 0 hours 10 minutes 22 seconds  Estimated Blood Loss:  Estimated blood  loss: none.      [facility]

## 2022-11-07 NOTE — H&P (Signed)
Wyline Mood, MD [address], [address], [place], [place], [address] [address], [place], [place], [address] Phone: [phone]  Fax: [phone]  Primary Care Physician:  Jarold Motto, [place]   Pre-Procedure History & Physical: HPI:  Aaron Frost is a 42 y.o. male is here for an colonoscopy.   Past Medical History:  Diagnosis Date   Acute diverticulitis 08/2022   Transverse Colon; NO abscess or Perforation   Asthma 08/09/2017   Fatty liver    Hypertension    Morbid obesity (HCC)    OSA on CPAP    Umbilical hernia 2016   Ventral hernia without obstruction or gangrene 08/2022   Large    Past Surgical History:  Procedure Laterality Date   EXTERNAL EAR SURGERY     right ear reconstruction   MOLE REMOVAL     Baco of Neck   TONSILLECTOMY  2005    Prior to Admission medications   Medication Sig Start Date End Date Taking? Authorizing Provider  acetaminophen (TYLENOL) 500 MG tablet Take 500 mg by mouth every 6 (six) hours as needed for mild pain.     [provider]  cephALEXin (KEFLEX) 500 MG capsule Take 1 capsule (500 mg total) by mouth 2 (two) times daily. 09/17/22   Jarold Motto, PA  docusate sodium (COLACE) 100 MG capsule Take 100 mg by mouth 2 (two) times daily.    [provider]  Multiple Vitamin (MULTIVITAMIN WITH MINERALS) TABS tablet Take 1 tablet by mouth daily.    [provider]  omeprazole (PRILOSEC OTC) 20 MG tablet Take 20 mg by mouth daily as needed.    [provider]  polyethylene glycol (MIRALAX / GLYCOLAX) 17 g packet Take 17 g by mouth daily.    [provider]  traMADol (ULTRAM) 50 MG tablet Take 1 tablet (50 mg total) by mouth every 6 (six) hours as needed. 08/28/22 08/28/23  Jarold Motto, PA  valsartan-hydrochlorothiazide (DIOVAN-HCT) 80-12.5 MG tablet Take 1 tablet by mouth daily. 07/20/22   Jarold Motto, PA  Zinc 30 MG CAPS Take 30 mg by mouth.    [provider]   hydrochlorothiazide 25 MG tablet Take 1 tablet (25 mg total) by mouth daily. 07/14/10 03/16/11  Norins, Rosalyn Gess, MD    Allergies as of 09/21/2022 - Review Complete 09/21/2022  Allergen Reaction Noted   Aleve [naproxen] Other (See Comments) 07/04/2011   Nickel Rash 04/23/2014    Family History  Problem Relation Age of Onset   Hypertension Mother    Heart disease Father    Benign prostatic hyperplasia Father    Ovarian cancer Sister    Diabetes Maternal Grandfather    Colon cancer Paternal Grandfather 75   Heart disease Paternal Grandfather    Stroke Paternal Grandfather    Breast cancer Neg Hx     Social History   Socioeconomic History   Marital status: Divorced    Spouse name: Not on file   Number of children: Not on file   Years of education: Not on file   Highest education level: Not on file  Occupational History   Occupation: customer service    Employer: [employer]  Tobacco Use   Smoking status: Former    Current packs/day: 0.00    Average packs/day: 1.5 packs/day for 3.0 years (4.5 ttl pk-yrs)    Types: Cigarettes    Start date: 01/08/2001    Quit date: 01/09/2004    Years since quitting: 18.8  Smokeless tobacco: Never  Substance and Sexual Activity   Alcohol use: Yes    Comment: very rare glass of wine   Drug use: No   Sexual activity: Never  Other Topics Concern   Not on file  Social History Narrative   Work for [employer] -- sedentary   Divorced, Dec 2015    Live in [place]   Fun: walk, watch TV   Social Determinants of Health   Financial Resource Strain: Not on file  Food Insecurity: No Food Insecurity (08/24/2022)   Hunger Vital Sign    Worried About Running Out of Food in the Last Year: Never true    Ran Out of Food in the Last Year: Never true  Transportation Needs: No Transportation Needs (08/24/2022)   PRAPARE - Administrator, Civil Service (Medical): No    Lack of Transportation (Non-Medical): No  Physical Activity: Not on  file  Stress: Not on file  Social Connections: Not on file  Intimate Partner Violence: Not on file    Review of Systems: See HPI, otherwise negative ROS  Physical Exam: There were no vitals taken for this visit. General:   Alert,  pleasant and cooperative in NAD Head:  Normocephalic and atraumatic. Neck:  Supple; no masses or thyromegaly. Lungs:  Clear throughout to auscultation, normal respiratory effort.    Heart:  +S1, +S2, Regular rate and rhythm, No edema. Abdomen:  Soft, nontender and nondistended. Normal bowel sounds, without guarding, and without rebound.   Neurologic:  Alert and  oriented x4;  grossly normal neurologically.  Impression/Plan: Aaron Frost is here for an colonoscopy to be performed for diverticulitis Risks, benefits, limitations, and alternatives regarding  colonoscopy have been reviewed with the patient.  Questions have been answered.  All parties agreeable.   Wyline Mood, MD  11/07/2022, 8:26 AM

## 2022-11-08 ENCOUNTER — Encounter: Payer: Self-pay | Admitting: Gastroenterology

## 2022-11-08 ENCOUNTER — Other Ambulatory Visit: Payer: Self-pay

## 2022-11-09 ENCOUNTER — Ambulatory Visit: Payer: BC Managed Care – PPO | Admitting: Physician Assistant

## 2022-12-04 ENCOUNTER — Ambulatory Visit: Payer: BC Managed Care – PPO | Admitting: Physician Assistant

## 2022-12-12 ENCOUNTER — Ambulatory Visit: Payer: BC Managed Care – PPO | Admitting: Physician Assistant

## 2022-12-31 ENCOUNTER — Ambulatory Visit (INDEPENDENT_AMBULATORY_CARE_PROVIDER_SITE_OTHER): Payer: BC Managed Care – PPO | Admitting: Physician Assistant

## 2022-12-31 ENCOUNTER — Telehealth: Payer: Self-pay | Admitting: *Deleted

## 2022-12-31 ENCOUNTER — Encounter: Payer: Self-pay | Admitting: Physician Assistant

## 2022-12-31 VITALS — BP 140/96 | HR 68 | Temp 97.3°F | Ht 73.0 in | Wt >= 6400 oz

## 2022-12-31 DIAGNOSIS — Z6841 Body Mass Index (BMI) 40.0 and over, adult: Secondary | ICD-10-CM

## 2022-12-31 DIAGNOSIS — T7840XA Allergy, unspecified, initial encounter: Secondary | ICD-10-CM

## 2022-12-31 DIAGNOSIS — Z0000 Encounter for general adult medical examination without abnormal findings: Secondary | ICD-10-CM | POA: Diagnosis not present

## 2022-12-31 DIAGNOSIS — R1084 Generalized abdominal pain: Secondary | ICD-10-CM

## 2022-12-31 DIAGNOSIS — R7303 Prediabetes: Secondary | ICD-10-CM | POA: Diagnosis not present

## 2022-12-31 DIAGNOSIS — I10 Essential (primary) hypertension: Secondary | ICD-10-CM

## 2022-12-31 LAB — LIPID PANEL
Cholesterol: 154 mg/dL (ref 0–200)
HDL: 38.1 mg/dL — ABNORMAL LOW (ref >39.00)
LDL Cholesterol: 98 mg/dL (ref 0–99)
NonHDL: 115.57
Total CHOL/HDL Ratio: 4
Triglycerides: 87 mg/dL (ref 0.0–149.0)
VLDL: 17.4 mg/dL (ref 0.0–40.0)

## 2022-12-31 LAB — CBC WITH DIFFERENTIAL/PLATELET
Basophils Absolute: 0.1 10*3/uL (ref 0.0–0.1)
Basophils Relative: 1 % (ref 0.0–3.0)
Eosinophils Absolute: 0.3 10*3/uL (ref 0.0–0.7)
Eosinophils Relative: 3.3 % (ref 0.0–5.0)
HCT: 45.1 % (ref 39.0–52.0)
Hemoglobin: 15.1 g/dL (ref 13.0–17.0)
Lymphocytes Relative: 23.9 % (ref 12.0–46.0)
Lymphs Abs: 2.1 10*3/uL (ref 0.7–4.0)
MCHC: 33.4 g/dL (ref 30.0–36.0)
MCV: 85 fL (ref 78.0–100.0)
Monocytes Absolute: 0.5 10*3/uL (ref 0.1–1.0)
Monocytes Relative: 6.1 % (ref 3.0–12.0)
Neutro Abs: 5.7 10*3/uL (ref 1.4–7.7)
Neutrophils Relative %: 65.7 % (ref 43.0–77.0)
Platelets: 262 10*3/uL (ref 150.0–400.0)
RBC: 5.31 Mil/uL (ref 4.22–5.81)
RDW: 14.2 % (ref 11.5–15.5)
WBC: 8.7 10*3/uL (ref 4.0–10.5)

## 2022-12-31 LAB — COMPREHENSIVE METABOLIC PANEL
ALT: 21 U/L (ref 0–53)
AST: 17 U/L (ref 0–37)
Albumin: 4 g/dL (ref 3.5–5.2)
Alkaline Phosphatase: 67 U/L (ref 39–117)
BUN: 11 mg/dL (ref 6–23)
CO2: 30 meq/L (ref 19–32)
Calcium: 8.5 mg/dL (ref 8.4–10.5)
Chloride: 100 meq/L (ref 96–112)
Creatinine, Ser: 0.77 mg/dL (ref 0.40–1.50)
GFR: 109.65 mL/min (ref >60.00)
Glucose, Bld: 108 mg/dL — ABNORMAL HIGH (ref 70–99)
Potassium: 4 meq/L (ref 3.5–5.1)
Sodium: 137 meq/L (ref 135–145)
Total Bilirubin: 0.6 mg/dL (ref 0.2–1.2)
Total Protein: 6.9 g/dL (ref 6.0–8.3)

## 2022-12-31 LAB — HEMOGLOBIN A1C: Hgb A1c MFr Bld: 6.3 % (ref 4.6–6.5)

## 2022-12-31 MED ORDER — MONTELUKAST SODIUM 10 MG PO TABS: 10.0000 mg | ORAL_TABLET | Freq: Every day | ORAL | 1 refills | Status: DC

## 2022-12-31 MED ORDER — FLUTICASONE PROPIONATE 50 MCG/ACT NA SUSP: 2.0000 | Freq: Every day | NASAL | 6 refills | Status: DC

## 2022-12-31 MED ORDER — POLYMYXIN B-TRIMETHOPRIM 10000-0.1 UNIT/ML-% OP SOLN: 1.0000 [drp] | OPHTHALMIC | 0 refills | Status: DC

## 2022-12-31 NOTE — Telephone Encounter (Signed)
Copied from CRM [id number]. Topic: Clinical - Medication Question >> Dec 31, 2022 12:26 PM Irine Seal wrote: Reason for CRM: PT was seen today for a physical, his eye is usually red from allergies, but he stated Dr. Bufford Buttner said if he wanted to have some medication called in for pink eye to give a callback, so Pt is requesting a medication to be called in to for potential pink eye as a precaution since he will be visiting family for the holidays  confirmed preferred pharmacy is  [id number]   [address] [place], [place] [address]

## 2022-12-31 NOTE — Patient Instructions (Addendum)
It was great to see you!  May use over the counter antihistamines such as Zyrtec (cetirizine), Claritin (loratadine), Allegra (fexofenadine), or Xyzal (levocetirizine) daily as needed. May take twice a day if needed as long as it does not cause drowsiness. May try Singulair 10mg  daily at night.  Cautioned that in some children/adults can experience behavioral changes including hyperactivity, agitation, depression, sleep disturbances and suicidal ideations. These side effects are rare, but if you notice them you should notify me and discontinue Singulair (montelukast). Start Flonase 1 spray twice a day. Nasal saline spray (i.e., Simply Saline) or nasal saline lavage (i.e., NeilMed) is recommended as needed and prior to medicated nasal sprays.  Please go to the lab for blood work.   Our office will call you with your results unless you have chosen to receive results via MyChart.  If your blood work is normal we will follow-up each year for physicals and as scheduled for chronic medical problems.  If anything is abnormal we will treat accordingly and get you in for a follow-up.  Take care,  Lelon Mast

## 2022-12-31 NOTE — Telephone Encounter (Signed)
Please see message. °

## 2022-12-31 NOTE — Addendum Note (Signed)
Addended by: Haynes Bast on: 12/31/2022 05:02 PM   Modules accepted: Orders

## 2022-12-31 NOTE — Progress Notes (Signed)
Subjective:    Aaron Frost is a 42 y.o. male and is here for a comprehensive physical exam.  HPI  There are no preventive care reminders to display for this patient.  Acute Concerns: None.   Chronic Issues: History of diverticulitis Pt had a partial colonoscopy on 10/30, finding included a "benign- appearing, intrinsic stenosis" Per results, "unable to reach cecum due to herniation of colon loops causing stenosis, will need repeat colonoscopy after hernia repair" Denies any GI symptoms today.  Reports he can tell when he needs to increase his fiber and try to eat smaller meals.  Has been prioritizing eating meats.  Planning to follow-up with gastroenterology soon.  Allergies: Complains of allergies, states it usually occurs around the winter time and cold weather.  Endorses intense postnasal drip, "thick like a paste", that has been interfering with his sleep when wearing the CPAP. When it is very cold out, he tries to wear a mask when outside to reduce his allergies.  Taking Claritin to manage symptoms and has not been taking Flonase.  States his eye irritation is from sleeping with his eyes open, typically occurs when staying at his parent's house, which he is doing over the holidays to dog-sit.  Is not using any eye drops/medication. Denies any eye drainage or crusting.  Has been prescribed Singulair in the past.   Health Maintenance: Immunizations -- UTD Colonoscopy -- UTD, done on 11/07/22. PSA --  Lab Results  Component Value Date   PSA 0.52 12/25/2019   Diet -- Prioritizes fiber/meats. See above.  Sleep habits -- Struggling to sleep due to allergies and not wearing CPAP.  Exercise -- As able.   Weight --  Recent weight history Wt Readings from Last 10 Encounters:  12/31/22 (!) 431 lb (195.5 kg)  11/07/22 (!) 417 lb (189.1 kg)  09/24/22 (!) 429 lb 12.8 oz (195 kg)  09/21/22 (!) 434 lb 12.8 oz (197.2 kg)  09/13/22 (!) 434 lb (196.9 kg)  09/12/22 (!)  437 lb (198.2 kg)  08/28/22 (!) 451 lb 8 oz (204.8 kg)  08/21/22 (!) 458 lb 8.9 oz (208 kg)  08/15/22 (!) 459 lb 4 oz (208.3 kg)  08/01/22 (!) 467 lb 4 oz (211.9 kg)   Body mass index is 56.86 kg/m.  Mood -- Stable.  Alcohol use --  reports current alcohol use.  Tobacco use --  Tobacco Use: Medium Risk (12/31/2022)   Patient History    Smoking Tobacco Use: Former    Smokeless Tobacco Use: Never    Passive Exposure: Not on file    Eligible for Low Dose CT?  UTD with eye doctor? yes UTD with dentist? yes     12/31/2022    8:08 AM  Depression screen PHQ 2/9  Decreased Interest 0  Down, Depressed, Hopeless 0  PHQ - 2 Score 0    Other providers/specialists: Patient Care Team: Jarold Motto, [place] as PCP - General (Physician Assistant) Parke Poisson, MD as PCP - Cardiology (Cardiology)    PMHx, SurgHx, SocialHx, Medications, and Allergies were reviewed in the Visit Navigator and updated as appropriate.   Past Medical History:  Diagnosis Date   Acute diverticulitis 08/2022   Transverse Colon; NO abscess or Perforation   Asthma 08/09/2017   Fatty liver    Hypertension    Morbid obesity (HCC)    OSA on CPAP    Umbilical hernia 2016   Ventral hernia without obstruction or gangrene 08/2022   Large  Past Surgical History:  Procedure Laterality Date   COLONOSCOPY WITH PROPOFOL N/A 11/07/2022   Procedure: COLONOSCOPY WITH PROPOFOL;  Surgeon: Wyline Mood, MD;  [facility] ENDOSCOPY;  Service: Gastroenterology;  Laterality: N/A;   EXTERNAL EAR SURGERY     right ear reconstruction   MOLE REMOVAL     Baco of Neck   TONSILLECTOMY  2005     Family History  Problem Relation Age of Onset   Hypertension Mother    Heart disease Father    Benign prostatic hyperplasia Father    Ovarian cancer Sister    Diabetes Maternal Grandfather    Colon cancer Paternal Grandfather 60   Heart disease Paternal Grandfather    Stroke Paternal Grandfather    Breast cancer  Neg Hx     Social History   Tobacco Use   Smoking status: Former    Current packs/day: 0.00    Average packs/day: 1.5 packs/day for 3.0 years (4.5 ttl pk-yrs)    Types: Cigarettes    Start date: 01/08/2001    Quit date: 01/09/2004    Years since quitting: 18.9   Smokeless tobacco: Never  Substance Use Topics   Alcohol use: Yes    Comment: very rare glass of wine   Drug use: No    Review of Systems:   Review of Systems  Constitutional:  Negative for chills, fever, malaise/fatigue and weight loss.  HENT:  Negative for hearing loss, sinus pain and sore throat.   Respiratory:  Negative for cough and hemoptysis.   Cardiovascular:  Negative for chest pain, palpitations, leg swelling and PND.  Gastrointestinal:  Negative for abdominal pain, constipation, diarrhea, heartburn, nausea and vomiting.  Genitourinary:  Negative for dysuria, frequency and urgency.  Musculoskeletal:  Negative for back pain, myalgias and neck pain.  Skin:  Negative for itching and rash.  Neurological:  Negative for dizziness, tingling, seizures and headaches.  Endo/Heme/Allergies:  Negative for polydipsia.  Psychiatric/Behavioral:  Negative for depression. The patient is not nervous/anxious.     Objective:    Vitals:   12/31/22 0804  BP: (!) 140/96  Pulse: 68  Temp: (!) 97.3 F (36.3 C)    Body mass index is 56.86 kg/m.  General  Alert, cooperative, no distress, appears stated age  Head:  Normocephalic, without obvious abnormality, atraumatic  Eyes:  PERRL, left conjunctiva/corneas clear, EOM's intact, fundi benign, both eyes      Right eye with injected conjunctiva  Ears:  Normal TM's and external ear canals, both ears  Nose: Nares normal, septum midline, mucosa normal, no drainage or sinus tenderness  Throat: Lips, mucosa, and tongue normal; teeth and gums normal  Neck: Supple, symmetrical, trachea midline, no adenopathy;     thyroid:  No enlargement/tenderness/nodules; no carotid bruit or JVD   Back:   Symmetric, no curvature, ROM normal, no CVA tenderness  Lungs:   Clear to auscultation bilaterally, respirations unlabored  Chest wall:  No tenderness or deformity  Heart:  Regular rate and rhythm, S1 and S2 normal, no murmur, rub or gallop  Abdomen:   Soft, non-tender, bowel sounds active all four quadrants, no masses, no organomegaly  Extremities: Extremities normal, atraumatic, no cyanosis or edema  Prostate : Deferred   Skin: Skin color, texture, turgor normal, no rashes or lesions  Lymph nodes: Cervical, supraclavicular, and axillary nodes normal  Neurologic: CNII-XII grossly intact. Normal strength, sensation and reflexes throughout   AssessmentPlan:   Routine physical examination Today patient counseled on age appropriate routine health concerns  for screening and prevention, each reviewed and up to date or declined. Immunizations reviewed and up to date or declined. Labs ordered and reviewed. Risk factors for depression reviewed and negative. Hearing function and visual acuity are intact. ADLs screened and addressed as needed. Functional ability and level of safety reviewed and appropriate. Education, counseling and referrals performed based on assessed risks today. Patient provided with a copy of personalized plan for preventive services.  Essential hypertension Above goal today No evidence of end-organ damage on my exam Recommend patient monitor home blood pressure at least a few times weekly Continue valsartan-hydrochloroTHIAZIDE 80-12.5 mg daily If home monitoring shows consistent elevation, or any symptom(s) develop, recommend reach out to [place] for further advice on next steps  Prediabetes Update Hemoglobin A1c and provide recommendations  Allergy, initial encounter Ongoing Continue claritin Start saline nasal spray and Flonase add back singulair Allergy referral per patient request  BMI 50.0-59.9, adult (HCC) Continue efforts at weight loss  Generalized  abdominal pain Continue efforts on staying ahead of pain and increasing fiber intake accordingly Close follow-up with gastroenterology for ongoing symptom management Consider consult with different surgeon for second opinion   I, Isabelle Course, acting as a Neurosurgeon for Jarold Motto, [place]., have documented all relevant documentation on the behalf of Jarold Motto, [place], as directed by  Jarold Motto, PA while in the presence of Jarold Motto, [place].  I, Jarold Motto, [place], have reviewed all documentation for this visit. The documentation on 12/31/22 for the exam, diagnosis, procedures, and orders are all accurate and complete.  Jarold Motto, PA-C [place] [facility]

## 2023-01-14 NOTE — Progress Notes (Signed)
 +   Ellouise Console, PA-C [address]  [address]  [place], [place] [address]  Main: [phone]  Fax: [phone]   Primary Care Physician: Job Lukes, [place]  Primary Gastroenterologist:  Ellouise Console, PA-C / Dr. Ruel Kung    CC: Follow-up diverticulitis, large ventral hernia, constipation, fatty liver  HPI: Aaron Frost is a 43 y.o. male presents for follow-up of diverticulitis, large abdominal wall hernia, morbid obesity (BMI 56.9), constipation, and fatty liver.  I last saw patient 09/2022 to follow-up diverticulitis and abnormal CT.  Takes Colace and MiraLAX for constipation.  Diverticulitis resolved posttreatment with Augmentin , Cipro, and Flagyl.  10/31/22 Abdominal pelvic CT with contrast: 1. No acute intra-abdominal process. 2. Unchanged partially visualized large ventral hernia containing fat and non-dilated transverse colon.  3. Hepatic steatosis.  Previous Diverticulitis had RESOLVED.  08/2022 Abdominal pelvic CT showed wall thickening and pericolonic stranding in the transverse colon concerning for acute diverticulitis or acute focal colitis. No perforation or abscess. He was treated with Augmentin . This was his first episode of diverticulitis. CT incidentally showed large 17.3 x 10.6 cm ventral hernia with a short segment of transverse colon within the hernia. No obstruction or inflammation. Also incidental enlarged fatty liver.    Family history significant for paternal grandfather who had colon cancer (age 12).   11/07/2022 Colonoscopy by Dr. Kung: Stricture in the transverse colon, likely due to abdominal hernia.  No polyps.  No biopsies.  Unable to reach the cecum due to herniation of colon loops causing stenosis.  Good prep.  Dr. Kung recommended repeating colonoscopy after patient had surgical abdominal hernia repair.  Patient last saw Dr. Jordis general surgeon 09/2022 to evaluate hernia.   He has not yet had abdominal hernia surgery. He needs to loose  weight before he can have surgery.  Current Symptoms: None.  He is not having any abdominal pain.  Currently improved.  Takes OTC stool softener as needed.  Currently having bowel movement daily.  Current Outpatient Medications  Medication Sig Dispense Refill   acetaminophen  (TYLENOL ) 500 MG tablet Take 500 mg by mouth every 6 (six) hours as needed for mild pain.      albuterol  (PROVENTIL ) (2.5 MG/3ML) 0.083% nebulizer solution Take 1 vial by nebulization every 4-6 hours as needed for wheezing     docusate sodium (COLACE) 100 MG capsule Take 100 mg by mouth 2 (two) times daily as needed.     fluticasone  (FLONASE ) 50 MCG/ACT nasal spray Place 2 sprays into both nostrils daily. 16 g 6   loratadine  (CLARITIN ) 10 MG tablet Take 10 mg by mouth daily.     montelukast  (SINGULAIR ) 10 MG tablet Take 1 tablet (10 mg total) by mouth at bedtime. 90 tablet 1   Multiple Vitamin (MULTIVITAMIN WITH MINERALS) TABS tablet Take 1 tablet by mouth daily.     omeprazole  (PRILOSEC  OTC) 20 MG tablet Take 20 mg by mouth daily as needed.     polyethylene glycol (MIRALAX / GLYCOLAX) 17 g packet Take 17 g by mouth daily.     trimethoprim -polymyxin b  (POLYTRIM ) ophthalmic solution Place 1 drop into the right eye every 4 (four) hours. 10 mL 0   valsartan -hydrochlorothiazide  (DIOVAN -HCT) 80-12.5 MG tablet Take 1 tablet by mouth daily. 90 tablet 1   Zinc 30 MG CAPS Take 30 mg by mouth.     No current facility-administered medications for this visit.    Allergies as of 01/15/2023 - Review Complete 01/15/2023  Allergen Reaction Noted   Aleve [naproxen]  Other (See Comments) 07/04/2011   Nickel Rash 04/23/2014    Past Medical History:  Diagnosis Date   Acute diverticulitis 08/2022   Transverse Colon; NO abscess or Perforation   Asthma 08/09/2017   Fatty liver    Hypertension    Morbid obesity (HCC)    OSA on CPAP    Umbilical hernia 2016   Ventral hernia without obstruction or gangrene 08/2022   Large    Past  Surgical History:  Procedure Laterality Date   COLONOSCOPY WITH PROPOFOL  N/A 11/07/2022   Procedure: COLONOSCOPY WITH PROPOFOL ;  Surgeon: Therisa Bi, MD;  Location: [facility] ENDOSCOPY;  Service: Gastroenterology;  Laterality: N/A;   EXTERNAL EAR SURGERY     right ear reconstruction   MOLE REMOVAL     Baco of Neck   TONSILLECTOMY  2005    Review of Systems:    All systems reviewed and negative except where noted in HPI.   Physical Examination:   BP 136/89   Pulse 62   Temp 97.9 F (36.6 C)   Ht 6' 1 (1.854 m)   Wt (!) 431 lb 6.4 oz (195.7 kg)   BMI 56.92 kg/m   General: Well-nourished, well-developed, obese, in no acute distress.  Neuro: Alert and oriented x 3.  Grossly intact.  Psych: Alert and cooperative, normal mood and affect.   Imaging Studies: No results found.  Assessment and Plan:   Aaron Frost is a 43 y.o. y/o male returns for follow-up of:  1.  History of uncomplicated diverticulitis -currently resolved and asymptomatic. 2.  Large ventral hernia 3.  Morbid obesity 4.  Mild constipation = currently improved 5.  Incomplete colonoscopy  11/07/2022 Colonoscopy by Dr. Therisa: Stricture in the transverse colon, likely due to abdominal hernia.  No polyps.  No biopsies.  Unable to reach the cecum due to herniation of colon loops causing stenosis.  Good prep.  Dr. Therisa recommended repeating colonoscopy after patient had surgical abdominal hernia repair.  Plan: -Continue weight loss -Follow-up with general surgeon for hernia repair -Continue Colace stool softener or MiraLAX for constipation -After he has recovered from hernia repair surgery, then return for repeat colonoscopy per Dr. Therisa. -Follow-up sooner if he has recurrent abdominal pain. -Patient education given regarding diverticulitis. -Start OTC Benefiber powder 1 to 2 tablespoons in a drink daily.  Ellouise Console, PA-C  Follow up as needed.

## 2023-01-15 ENCOUNTER — Encounter: Payer: Self-pay | Admitting: Physician Assistant

## 2023-01-15 ENCOUNTER — Ambulatory Visit (INDEPENDENT_AMBULATORY_CARE_PROVIDER_SITE_OTHER): Payer: BC Managed Care – PPO | Admitting: Physician Assistant

## 2023-01-15 VITALS — BP 136/89 | HR 62 | Temp 97.9°F | Ht 73.0 in | Wt >= 6400 oz

## 2023-01-15 DIAGNOSIS — K5900 Constipation, unspecified: Secondary | ICD-10-CM

## 2023-01-15 DIAGNOSIS — K56609 Unspecified intestinal obstruction, unspecified as to partial versus complete obstruction: Secondary | ICD-10-CM | POA: Diagnosis not present

## 2023-01-15 DIAGNOSIS — K439 Ventral hernia without obstruction or gangrene: Secondary | ICD-10-CM | POA: Diagnosis not present

## 2023-01-15 DIAGNOSIS — Z6841 Body Mass Index (BMI) 40.0 and over, adult: Secondary | ICD-10-CM

## 2023-01-15 DIAGNOSIS — K5904 Chronic idiopathic constipation: Secondary | ICD-10-CM

## 2023-01-15 DIAGNOSIS — Z8719 Personal history of other diseases of the digestive system: Secondary | ICD-10-CM

## 2023-02-20 DIAGNOSIS — G4733 Obstructive sleep apnea (adult) (pediatric): Secondary | ICD-10-CM

## 2023-04-10 ENCOUNTER — Other Ambulatory Visit: Payer: Self-pay | Admitting: Physician Assistant

## 2023-05-08 ENCOUNTER — Ambulatory Visit: Admitting: Physician Assistant

## 2023-07-04 ENCOUNTER — Ambulatory Visit (INDEPENDENT_AMBULATORY_CARE_PROVIDER_SITE_OTHER): Admitting: Family

## 2023-07-04 ENCOUNTER — Ambulatory Visit: Payer: Self-pay

## 2023-07-04 VITALS — BP 138/98 | HR 90 | Temp 98.4°F | Ht 73.0 in | Wt >= 6400 oz

## 2023-07-04 DIAGNOSIS — J4520 Mild intermittent asthma, uncomplicated: Secondary | ICD-10-CM | POA: Diagnosis not present

## 2023-07-04 DIAGNOSIS — J0110 Acute frontal sinusitis, unspecified: Secondary | ICD-10-CM

## 2023-07-04 DIAGNOSIS — J302 Other seasonal allergic rhinitis: Secondary | ICD-10-CM | POA: Diagnosis not present

## 2023-07-04 MED ORDER — ALBUTEROL SULFATE (2.5 MG/3ML) 0.083% IN NEBU: 2.5000 mg | INHALATION_SOLUTION | Freq: Four times a day (QID) | RESPIRATORY_TRACT | 0 refills | Status: AC | PRN

## 2023-07-04 MED ORDER — AMOXICILLIN-POT CLAVULANATE 875-125 MG PO TABS: 1.0000 | ORAL_TABLET | Freq: Two times a day (BID) | ORAL | 0 refills | Status: DC

## 2023-07-04 MED ORDER — FLUTICASONE PROPIONATE 50 MCG/ACT NA SUSP: 1.0000 | Freq: Every day | NASAL | 6 refills | Status: AC

## 2023-07-04 NOTE — Telephone Encounter (Addendum)
  FYI Only or Action Required?: FYI only for provider.  Patient was last seen in primary care on 12/31/2022 by Job Lukes, PA. Called Nurse Triage reporting Fever and Facial Pain. Symptoms began 3 days ago. Interventions attempted: OTC medications: Advil  and Rest, hydration, or home remedies. Symptoms are: gradually worsening.  Triage Disposition: See Physician Within 24 Hours  Patient/caregiver understands and will follow disposition?: Yes     Appointment today 07/04/2023 at 11:30 AM with Corean Comment NP                 Copied from CRM [id number]. Topic: Clinical - Red Word Triage >> Jul 04, 2023  8:03 AM Treva T wrote: [facility] that prompted transfer to Nurse Triage: Patient calling states, running fever of 101.4, and chills, nasal congestion, coughing, sinus pressure, jaw pain Reason for Disposition  [1] Sinus pain (not just congestion) AND [2] fever  Answer Assessment - Initial Assessment Questions 1. LOCATION: Where does it hurt?      sinuses 2. ONSET: When did the sinus pain start?  (e.g., hours, days)      3 days ago 3. SEVERITY: How bad is the pain?   (Scale 1-10; mild, moderate or severe)   - MILD (1-3): doesn't interfere with normal activities    - MODERATE (4-7): interferes with normal activities (e.g., work or school) or awakens from sleep   - SEVERE (8-10): excruciating pain and patient unable to do any normal activities        Congested 4. RECURRENT SYMPTOM: Have you ever had sinus problems before? If Yes, ask: When was the last time? and What happened that time?      Yes usually happens 5. NASAL CONGESTION: Is the nose blocked? If Yes, ask: Can you open it or must you breathe through your mouth?     Congestion 6. NASAL DISCHARGE: Do you have discharge from your nose? If so ask, What color?     ---- 7. FEVER: Do you have a fever? If Yes, ask: What is it, how was it measured, and when did it start?      101.4 until about  3 AM 8. OTHER SYMPTOMS: Do you have any other symptoms? (e.g., sore throat, cough, earache, difficulty breathing)     Nasal drainage, sore throat    Flu A, Flu B, and COVID test at home---all negative Patient states fever all night until 3 AM today--woke up around 6:30 AM and felt like fever may have broken during the night  Protocols used: Sinus Pain or Congestion-A-AH

## 2023-07-04 NOTE — Telephone Encounter (Signed)
 Noted, appt today.
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
## 2023-07-05 ENCOUNTER — Other Ambulatory Visit: Payer: Self-pay | Admitting: Physician Assistant

## 2023-10-26 ENCOUNTER — Other Ambulatory Visit: Payer: Self-pay | Admitting: Physician Assistant

## 2024-01-03 ENCOUNTER — Encounter: Payer: BC Managed Care – PPO | Admitting: Physician Assistant

## 2024-02-11 ENCOUNTER — Ambulatory Visit: Admitting: Physician Assistant

## 2024-02-11 ENCOUNTER — Encounter: Payer: Self-pay | Admitting: Physician Assistant

## 2024-02-11 VITALS — BP 122/80 | HR 64 | Temp 98.2°F | Ht 73.0 in | Wt >= 6400 oz

## 2024-02-11 DIAGNOSIS — R7303 Prediabetes: Secondary | ICD-10-CM

## 2024-02-11 DIAGNOSIS — Z0000 Encounter for general adult medical examination without abnormal findings: Secondary | ICD-10-CM

## 2024-02-11 DIAGNOSIS — E669 Obesity, unspecified: Secondary | ICD-10-CM | POA: Diagnosis not present

## 2024-02-11 DIAGNOSIS — K5904 Chronic idiopathic constipation: Secondary | ICD-10-CM

## 2024-02-11 DIAGNOSIS — I10 Essential (primary) hypertension: Secondary | ICD-10-CM | POA: Diagnosis not present

## 2024-02-11 DIAGNOSIS — K429 Umbilical hernia without obstruction or gangrene: Secondary | ICD-10-CM | POA: Diagnosis not present

## 2024-02-11 DIAGNOSIS — Z6841 Body Mass Index (BMI) 40.0 and over, adult: Secondary | ICD-10-CM | POA: Diagnosis not present

## 2024-02-11 DIAGNOSIS — G4733 Obstructive sleep apnea (adult) (pediatric): Secondary | ICD-10-CM | POA: Diagnosis not present

## 2024-02-11 LAB — HEMOGLOBIN A1C: Hgb A1c MFr Bld: 6.9 % — ABNORMAL HIGH (ref 4.6–6.5)

## 2024-02-11 LAB — LIPID PANEL
Cholesterol: 208 mg/dL — ABNORMAL HIGH (ref 28–200)
HDL: 40.2 mg/dL
LDL Cholesterol: 143 mg/dL — ABNORMAL HIGH (ref 10–99)
NonHDL: 167.55
Total CHOL/HDL Ratio: 5
Triglycerides: 121 mg/dL (ref 10.0–149.0)
VLDL: 24.2 mg/dL (ref 0.0–40.0)

## 2024-02-11 LAB — CBC WITH DIFFERENTIAL/PLATELET
Basophils Absolute: 0.1 10*3/uL (ref 0.0–0.1)
Basophils Relative: 0.8 % (ref 0.0–3.0)
Eosinophils Absolute: 0.2 10*3/uL (ref 0.0–0.7)
Eosinophils Relative: 3.2 % (ref 0.0–5.0)
HCT: 46.7 % (ref 39.0–52.0)
Hemoglobin: 15.6 g/dL (ref 13.0–17.0)
Lymphocytes Relative: 27 % (ref 12.0–46.0)
Lymphs Abs: 2.1 10*3/uL (ref 0.7–4.0)
MCHC: 33.4 g/dL (ref 30.0–36.0)
MCV: 84.1 fl (ref 78.0–100.0)
Monocytes Absolute: 0.6 10*3/uL (ref 0.1–1.0)
Monocytes Relative: 7.1 % (ref 3.0–12.0)
Neutro Abs: 4.8 10*3/uL (ref 1.4–7.7)
Neutrophils Relative %: 61.9 % (ref 43.0–77.0)
Platelets: 250 10*3/uL (ref 150.0–400.0)
RBC: 5.54 Mil/uL (ref 4.22–5.81)
RDW: 14.2 % (ref 11.5–15.5)
WBC: 7.8 10*3/uL (ref 4.0–10.5)

## 2024-02-11 LAB — COMPREHENSIVE METABOLIC PANEL WITH GFR
ALT: 27 U/L (ref 3–53)
AST: 25 U/L (ref 5–37)
Albumin: 4.3 g/dL (ref 3.5–5.2)
Alkaline Phosphatase: 60 U/L (ref 39–117)
BUN: 11 mg/dL (ref 6–23)
CO2: 30 meq/L (ref 19–32)
Calcium: 9.6 mg/dL (ref 8.4–10.5)
Chloride: 98 meq/L (ref 96–112)
Creatinine, Ser: 0.78 mg/dL (ref 0.40–1.50)
GFR: 108.37 mL/min
Glucose, Bld: 101 mg/dL — ABNORMAL HIGH (ref 70–99)
Potassium: 4 meq/L (ref 3.5–5.1)
Sodium: 137 meq/L (ref 135–145)
Total Bilirubin: 0.7 mg/dL (ref 0.2–1.2)
Total Protein: 7.5 g/dL (ref 6.0–8.3)

## 2024-02-11 MED ORDER — METFORMIN HCL ER 750 MG PO TB24: 750.0000 mg | ORAL_TABLET | Freq: Every day | ORAL | 1 refills | Status: AC
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
## 2024-02-13 ENCOUNTER — Ambulatory Visit: Payer: Self-pay

## 2024-02-13 ENCOUNTER — Ambulatory Visit: Payer: Self-pay | Admitting: Physician Assistant

## 2024-02-13 ENCOUNTER — Ambulatory Visit: Admitting: Adult Health

## 2024-02-13 NOTE — Telephone Encounter (Signed)
 Appt today

## 2024-02-13 NOTE — Telephone Encounter (Signed)
 FYI Only or Action Required?: Action required by provider: referral request. Requesting referral to Pulm  Patient was last seen in primary care on 02/11/2024 by Job Lukes, PA.  Called Nurse Triage reporting Respiratory Distress.  Symptoms began several days ago.  Interventions attempted: Other: nebulizer.  Symptoms are: gradually worsening.  Triage Disposition: See HCP Within 4 Hours (Or PCP Triage)  Patient/caregiver understands and will follow disposition?: Yes                              Reason for Triage: raspy - lungs and background of pulmonary issues was on oxygen  and has asthma - cold and getting worse.  Reason for Disposition  [1] MILD difficulty breathing (e.g., minimal/no SOB at rest, SOB with walking, pulse < 100) AND [2] NEW-onset or WORSE than normal  Answer Assessment - Initial Assessment Questions 1. RESPIRATORY STATUS: Describe your breathing? (e.g., wheezing, shortness of breath, unable to speak, severe coughing)      Raspy lungs - pinching in left lung 2. ONSET: When did this breathing problem begin?      A few days 3. PATTERN Does the difficult breathing come and go, or has it been constant since it started?      Comes and goes 4. SEVERITY: How bad is your breathing? (e.g., mild, moderate, severe)      Mild moderate 5. RECURRENT SYMPTOM: Have you had difficulty breathing before? If Yes, ask: When was the last time? and What happened that time?      yes 6. CARDIAC HISTORY: Do you have any history of heart disease? (e.g., heart attack, angina, bypass surgery, angioplasty)      yes 7. LUNG HISTORY: Do you have any history of lung disease?  (e.g., pulmonary embolus, asthma, emphysema)     yes 8. CAUSE: What do you think is causing the breathing problem?      uri 9. OTHER SYMPTOMS: Do you have any other symptoms? (e.g., chest pain, cough, dizziness, fever, runny nose)     Cough - Very small amount of blood in  phlegm 10. O2 SATURATION MONITOR:  Do you use an oxygen  saturation monitor (pulse oximeter) at home? If Yes, ask: What is your reading (oxygen  level) today? What is your usual oxygen  saturation reading? (e.g., 95%)       Lost pulse ox - was at 95% on tuesday  Protocols used: Breathing Difficulty-A-AH

## 2024-02-14 ENCOUNTER — Ambulatory Visit: Admitting: Family Medicine

## 2024-02-14 ENCOUNTER — Encounter: Payer: Self-pay | Admitting: Family Medicine

## 2024-02-14 VITALS — BP 138/84 | HR 86 | Temp 97.3°F | Ht 73.0 in | Wt >= 6400 oz

## 2024-02-14 DIAGNOSIS — J0101 Acute recurrent maxillary sinusitis: Secondary | ICD-10-CM

## 2024-02-14 DIAGNOSIS — J4521 Mild intermittent asthma with (acute) exacerbation: Secondary | ICD-10-CM

## 2024-02-14 MED ORDER — CEFDINIR 300 MG PO CAPS: 300.0000 mg | ORAL_CAPSULE | Freq: Two times a day (BID) | ORAL | 0 refills | Status: AC

## 2024-02-14 NOTE — Patient Instructions (Signed)
 Nebs every 6 hrs as needed Sent in antibiotics

## 2024-02-14 NOTE — Progress Notes (Signed)
 "  Subjective:     Patient ID: Aaron Frost, male    DOB: [birth year], 44 y.o.   MRN: [id number]  Chief Complaint  Patient presents with   Cough    Pt thinks he has a sinus infection     Discussed the use of AI scribe software for clinical note transcription with the patient, who gave verbal consent to proceed.  History of Present Illness Aaron Frost is a 44 year old male with a history of sinus infections who presents with persistent congestion and cough.  He has been experiencing congestion and cough since December, with symptoms persisting despite initial self-management. The congestion involves postnasal drip leading to chest involvement, and he notes swelling of the gums, which worsens by the end of the day. Mucinex  was initially helpful, but symptoms have returned.  He uses a CPAP machine. Coughing has progressed to involve his lungs, and he has been using a nebulizer with albuterol  to manage wheezing and coughing. He has a prescription for albuterol  and uses it as needed, with a sufficient supply on hand.  He experiences low-grade fevers, particularly towards the end of the day, accompanied by chills, but no high fevers. Facial pain and headaches have affected his ability to sleep.  His current medications for allergies and asthma include montelukast  and Flonase , which he takes daily. He also takes metformin  for elevated blood sugars. No significant shortness of breath.    There are no preventive care reminders to display for this patient.  Past Medical History:  Diagnosis Date   Acute diverticulitis 08/2022   Transverse Colon; NO abscess or Perforation   Asthma 08/09/2017   Fatty liver    Hypertension    Morbid obesity (HCC)    OSA on CPAP    Umbilical hernia 2016   Ventral hernia without obstruction or gangrene 08/2022   Large    Past Surgical History:  Procedure Laterality Date   COLONOSCOPY WITH PROPOFOL  N/A 11/07/2022   Procedure: COLONOSCOPY WITH  PROPOFOL ;  Surgeon: Therisa Bi, MD;  Location: [facility] ENDOSCOPY;  Service: Gastroenterology;  Laterality: N/A;   EXTERNAL EAR SURGERY     right ear reconstruction   MOLE REMOVAL     Baco of Neck   TONSILLECTOMY  2005    Current Medications[1]  Allergies[2] ROS neg/noncontributory except as noted HPI/below      Objective:     BP 138/84 (BP Location: Left Arm, Patient Position: Sitting, Cuff Size: Large)   Pulse 86   Temp (!) 97.3 F (36.3 C) (Temporal)   Ht 6' 1 (1.854 m)   Wt (!) 457 lb 8 oz (207.5 kg)   SpO2 99%   BMI 60.36 kg/m  Wt Readings from Last 3 Encounters:  02/14/24 (!) 457 lb 8 oz (207.5 kg)  02/11/24 (!) 457 lb 8 oz (207.5 kg)  07/04/23 (!) 452 lb (205 kg)    Physical Exam GENERAL: Well developed, well nourished, no acute distress. HEAD EYES EARS NOSE THROAT: Normocephalic, atraumatic, conjunctiva not injected, sclera nonicteric,TM WNL B nose normal, sinuses non-tender to percussion. OP clear, moist, no exudated CARDIAC: Regular rate and rhythm, S1 S2 present, no murmur NECK: Supple, no thyromegaly, no nodes,. LUNGS: mild end Expiratory wheezing with good air exchange.. MUSCULOSKELETAL: No gross abnormalities. NEUROLOGICAL: Alert and oriented x3, cranial nerves II through XII intact. PSYCHIATRIC: Normal mood, good eye contact.       Assessment & Plan:  Acute recurrent maxillary sinusitis  Mild intermittent asthma with acute exacerbation  Other orders -  Cefdinir ; Take 1 capsule (300 mg total) by mouth 2 (two) times daily.  Dispense: 14 capsule; Refill: 0    Assessment and Plan Assessment & Plan Acute recurrent maxillary sinusitis   Chronic sinus congestion since December with postnasal drip, gum swelling, and facial pressure. Low-grade fever and chills present, but no significant tenderness or high fever. Symptoms suggest sinusitis. Prescribed Omnicef  for lung and sinus coverage. Continue Mucinex  for symptom relief.  Mild intermittent asthma  with acute exacerbation   Increased coughing and wheezing with mild expiratory wheezing on examination. No significant shortness of breath. Nebulizer use initiated for symptom relief. No current need for oral steroids. Use nebulizer every six hours as needed for wheezing or coughing. Continue albuterol  inhaler as prescribed.  Type 2 diabetes mellitus   Elevated blood sugars noted.  General Health Maintenance   Discussion about receiving the pneumonia vaccine. No contraindications with current treatment plan as prednisone  is not being used. Proceed with pneumonia vaccination as planned.     Return if symptoms worsen or fail to improve.  Aaron CHRISTELLA Carrel, MD     [1]  Current Outpatient Medications:    acetaminophen  (TYLENOL ) 500 MG tablet, Take 500 mg by mouth every 6 (six) hours as needed for mild pain. , Disp: , Rfl:    albuterol  (PROVENTIL ) (2.5 MG/3ML) 0.083% nebulizer solution, Take 3 mLs (2.5 mg total) by nebulization every 6 (six) hours as needed for wheezing or shortness of breath., Disp: 75 mL, Rfl: 0   cefdinir  (OMNICEF ) 300 MG capsule, Take 1 capsule (300 mg total) by mouth 2 (two) times daily., Disp: 14 capsule, Rfl: 0   docusate sodium (COLACE) 100 MG capsule, Take 100 mg by mouth 2 (two) times daily as needed., Disp: , Rfl:    fluticasone  (FLONASE ) 50 MCG/ACT nasal spray, Place 1 spray into both nostrils daily. Start with 1 spray each side bid x 3d, then reduce to qd., Disp: 9.9 mL, Rfl: 6   loratadine  (CLARITIN ) 10 MG tablet, Take 10 mg by mouth daily., Disp: , Rfl:    metFORMIN  (GLUCOPHAGE -XR) 750 MG 24 hr tablet, Take 1 tablet (750 mg total) by mouth daily with breakfast., Disp: 90 tablet, Rfl: 1   montelukast  (SINGULAIR ) 10 MG tablet, TAKE 1 TABLET BY MOUTH EVERYDAY AT BEDTIME, Disp: 90 tablet, Rfl: 1   Multiple Vitamin (MULTIVITAMIN WITH MINERALS) TABS tablet, Take 1 tablet by mouth daily., Disp: , Rfl:    omeprazole  (PRILOSEC  OTC) 20 MG tablet, Take 20 mg by mouth daily as  needed., Disp: , Rfl:    valsartan -hydrochlorothiazide  (DIOVAN -HCT) 80-12.5 MG tablet, TAKE 1 TABLET BY MOUTH EVERY DAY, Disp: 90 tablet, Rfl: 1   Zinc 30 MG CAPS, Take 30 mg by mouth., Disp: , Rfl:  [2]  Allergies Allergen Reactions   Aleve [Naproxen] Other (See Comments)    Burning sensation and headaches   Nickel Rash   "

## 2024-05-11 ENCOUNTER — Ambulatory Visit: Admitting: Physician Assistant
